# Patient Record
Sex: Female | Born: 1970 | Race: Black or African American | Hispanic: No | Marital: Single | Smoking: Never smoker
Health system: Southern US, Community
[De-identification: ages and names within clinical notes are randomized; demographics above are authoritative.]

## PROBLEM LIST (undated history)

## (undated) DIAGNOSIS — M419 Scoliosis, unspecified: Secondary | ICD-10-CM

## (undated) DIAGNOSIS — Q8500 Neurofibromatosis, unspecified: Secondary | ICD-10-CM

## (undated) DIAGNOSIS — R519 Headache, unspecified: Secondary | ICD-10-CM

## (undated) DIAGNOSIS — J45909 Unspecified asthma, uncomplicated: Secondary | ICD-10-CM

## (undated) DIAGNOSIS — N289 Disorder of kidney and ureter, unspecified: Secondary | ICD-10-CM

## (undated) DIAGNOSIS — I10 Essential (primary) hypertension: Secondary | ICD-10-CM

## (undated) DIAGNOSIS — E079 Disorder of thyroid, unspecified: Secondary | ICD-10-CM

## (undated) HISTORY — DX: Scoliosis, unspecified: M41.9

## (undated) HISTORY — DX: Neurofibromatosis, unspecified: Q85.00

## (undated) HISTORY — PX: OTHER SURGICAL HISTORY: SHX169

## (undated) HISTORY — DX: Disorder of thyroid, unspecified: E07.9

## (undated) HISTORY — PX: THYROIDECTOMY: SHX17

## (undated) HISTORY — DX: Essential (primary) hypertension: I10

---

## 2004-01-13 ENCOUNTER — Ambulatory Visit: Payer: Self-pay | Admitting: Family Medicine

## 2004-02-16 ENCOUNTER — Ambulatory Visit: Payer: Self-pay | Admitting: Pain Medicine

## 2004-02-25 ENCOUNTER — Ambulatory Visit: Payer: Self-pay | Admitting: Pain Medicine

## 2004-03-02 ENCOUNTER — Ambulatory Visit: Payer: Self-pay | Admitting: Pain Medicine

## 2004-03-08 ENCOUNTER — Ambulatory Visit: Payer: Self-pay | Admitting: Pain Medicine

## 2004-03-24 ENCOUNTER — Ambulatory Visit: Payer: Self-pay | Admitting: Pain Medicine

## 2004-04-02 ENCOUNTER — Ambulatory Visit: Payer: Self-pay

## 2004-04-15 ENCOUNTER — Ambulatory Visit: Payer: Self-pay | Admitting: Unknown Physician Specialty

## 2004-05-20 ENCOUNTER — Ambulatory Visit: Payer: Self-pay | Admitting: General Surgery

## 2004-12-24 ENCOUNTER — Ambulatory Visit: Payer: Self-pay | Admitting: Oncology

## 2005-03-23 ENCOUNTER — Ambulatory Visit: Payer: Self-pay | Admitting: Oncology

## 2005-09-16 ENCOUNTER — Ambulatory Visit: Payer: Self-pay | Admitting: Unknown Physician Specialty

## 2005-09-19 ENCOUNTER — Emergency Department: Payer: Self-pay | Admitting: Emergency Medicine

## 2005-09-19 ENCOUNTER — Other Ambulatory Visit: Payer: Self-pay

## 2005-09-21 ENCOUNTER — Inpatient Hospital Stay: Payer: Self-pay | Admitting: Internal Medicine

## 2005-09-22 ENCOUNTER — Other Ambulatory Visit: Payer: Self-pay

## 2005-10-19 ENCOUNTER — Ambulatory Visit: Payer: Self-pay

## 2006-05-12 IMAGING — CT CT THORACIC SPINE WITHOUT CONTRAST
2 of 15 series · 9 of 35 positions shown, 10 images · non-contrast
Comparison: Correlation is made with recent thoracic spine MRI
March 02, 2004.

REASON FOR EXAM: Abnormal MRI, kyphoscoliosis
COMMENTS:

PROCEDURE:     CT  - CT THORACIC SPINE WO  - April 02, 2004  [DATE]
RESULT:     The following report is from Dr. Ling Polo from [REDACTED].
HISTORY: 33-year-old female, scoliosis, upper back pain, becoming worse.
No recent injury or surgery.  Kyphoscoliosis.
Technical Factors:  2.5 mm axial thin sections were obtained through the
thoracic and lower cervical spine.  Sagittal and coronal reformats were
obtained.  Study performed without IV contrast.

[Series 6: inspace t-spine · axial · 0.31mm/px · z∈[-512,-386]mm · 3 of 510 slices shown, 4 images]
[im 128/510  soft-tissue]
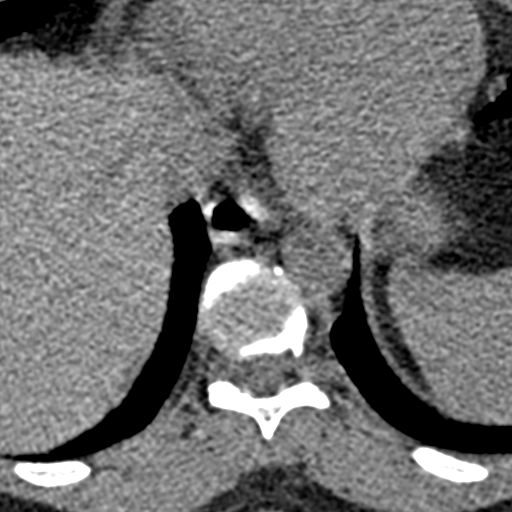
[im 128/510  bone]
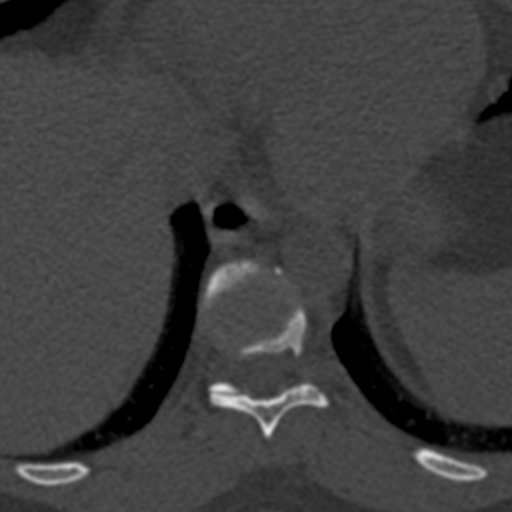
[im 255/510  bone]
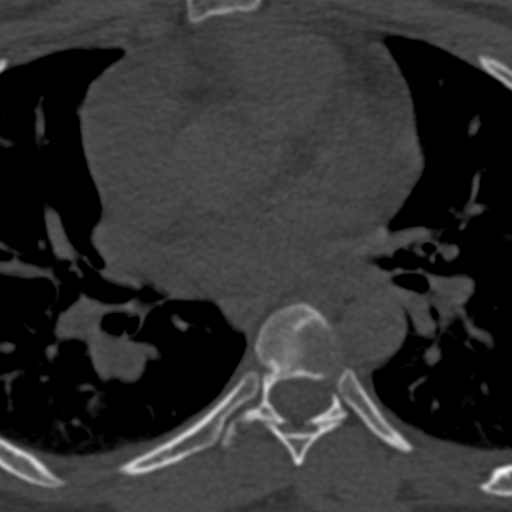
[im 382/510  bone]
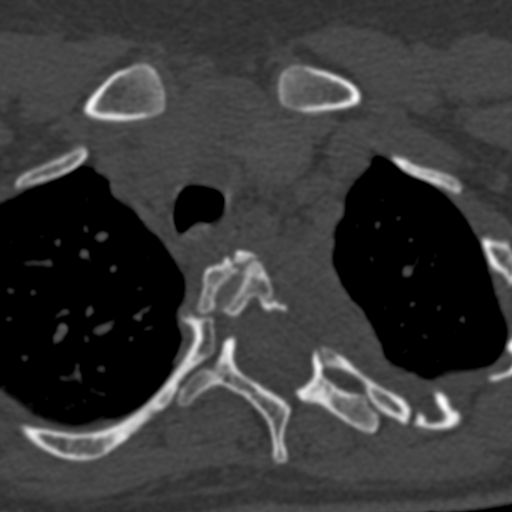

[Series 608: cor_lower_t · coronal · 0.60mm/px · 6 of 31 slices shown]
[im 9/31  soft-tissue]
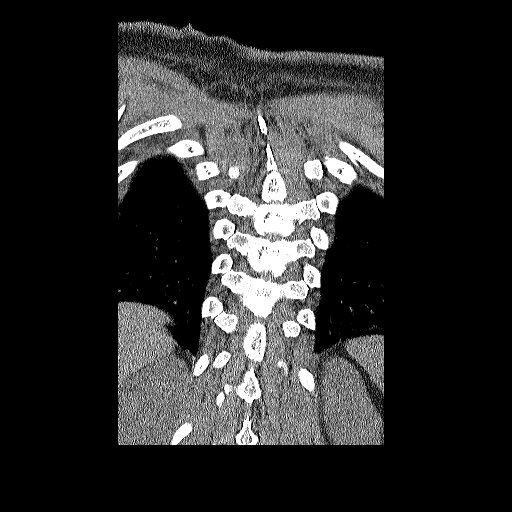
[im 11/31  bone]
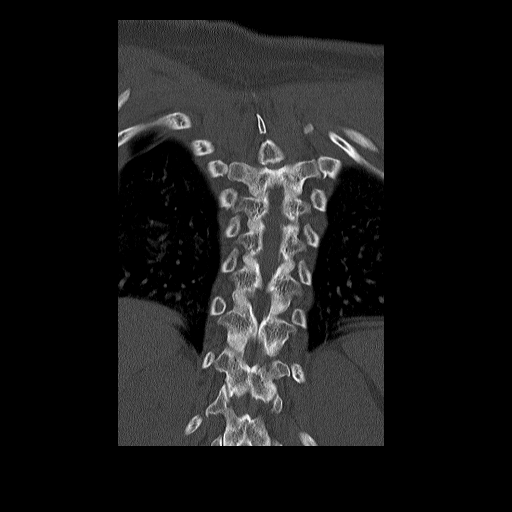
[im 13/31  bone]
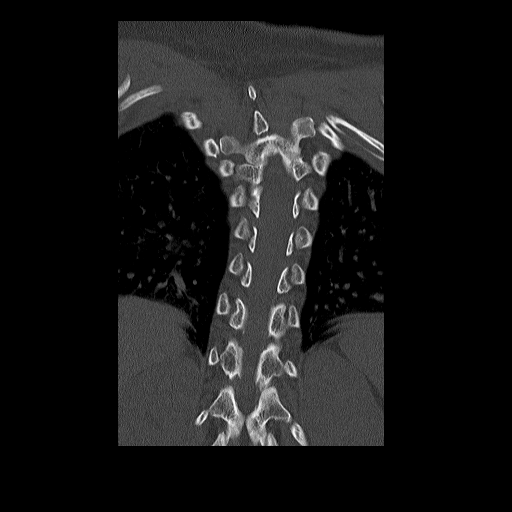
[im 16/31  bone]
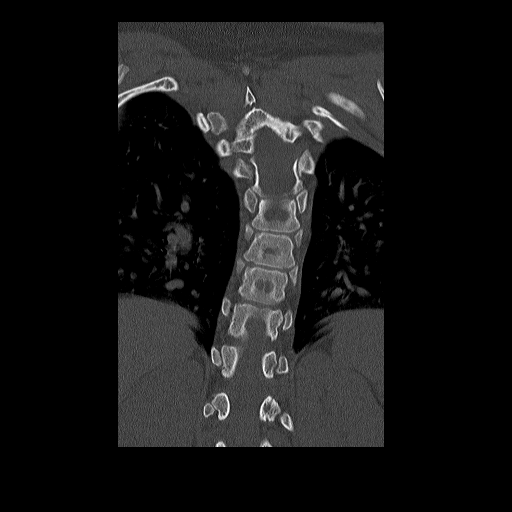
[im 18/31  bone]
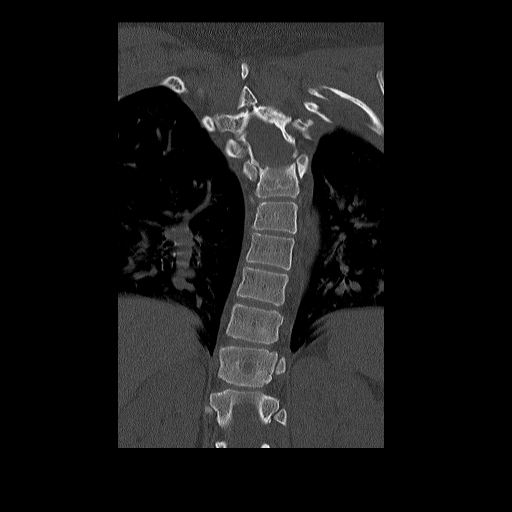
[im 21/31  bone]
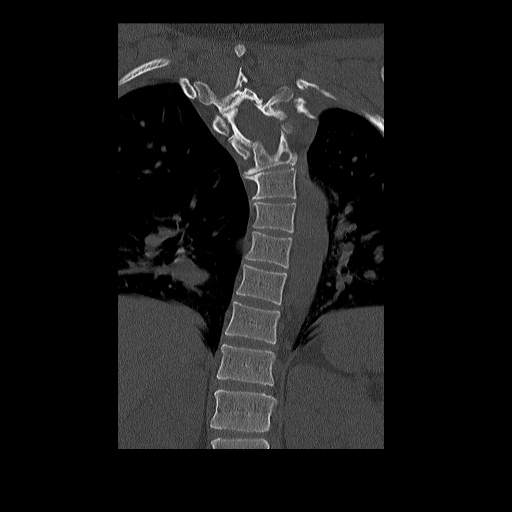

[9 of 35 positions shown; findings below may reference images not displayed]

FINDINGS: There is an S-shaped kyphoscoliosis in the upper thoracic spine,
having a leftward convex curvature with apex at approximately T5-6 level and
rightward convex curvature with apex at approximately the T1 level.  There
is associated mild to moderate multilevel degenerative disc change in the
upper thoracic and lower cervical spine, extending from approximately C6
through T6 or T7, with prominent rightward bony endplate spurring at T5-6
and leftward bony endplate spurring at T3-4, secondary to the scoliotic
curvatures.

Thoracic vertebrae demonstrate satisfactory height on the sagittal
reformats.  However, there is deformity of the T1 through T4 vertebral
bodies, appearing hypoplastic anteriorly and with posterior scalloping,
which is associated with ventral thecal sac contour alteration suggestive of
dural ectasia as a congenital/developmental anomaly.  Asymmetric hypoplasia
and chronic bone remodeling of the leftward pedicles in the upper thoracic
and lower cervical spine is also again noted, involving C7 through T4
levels.  The leftward T1 pedicle appears discontinuous, likely secondary to
chronic bony remodeling versus congenital/developmental absence or
hypoplasia.

Enlargement of the leftward C7-T1 and T3-4 neural foramina, associated with
this chronic pedicle remodeling, correlates with cystic dilatation of the
exiting nerve root sleeves at these levels, suggesting an additional
manifestation of dural ectasia.  The findings may reflect lateral thoracic
meningocele formation, rather than simple nerve root sheath cyst formation.

The chronic contour abnormalities and bony remodeling occurring in the upper
thoracic spine, associated with the kyphoscoliosis, results in multilevel
superimposed degenerative spondylotic change and chronic reactive bony
sclerosis along the posterior elements.

No evidence of acute/recent thoracic vertebral body or posterior element
fracture.

Evidence of a paraspinous soft tissue mass is again noted leftward, along
the medial aspect of the pulmonary apex, likely extrapleural in location.
This lies adjacent to the LEFT 1st through 4th ribs, which demonstrate
chronic bony remodeling.  As suggested on the recent thoracic spine MRI
report, in light of the thoracic spine findings, this paraspinous mass may
represent a plexiform neurofibroma, with malignant neoplastic etiology less
likely.  Additional MR imaging of the upper chest may be helpful in further
characterization of this mass, to include axial T2 and STIR, and coronal T1
and T2 sequences, and including the lower neck.  In addition, postcontrast
triplanar T1 sequences would be suggested if MR imaging is performed.

No substantive central spinal canal stenosis in the thoracic region.  Please
see separate thoracic spine MRI report for further discussion of discogenic
changes in the mid and lower thoracic spine.

The thyroid gland is included in the field of view on the axial sequences,
but is noted optimally evaluated due to absence of IV contrast and bone
algorithm technique used for the CT study.

Due to the extreme complexity of the bony changes in the upper thoracic
spine, either a complete set of films or a CD should be provided to the
treating physician when addressing spinal disease or complaints.
IMPRESSION: Moderate severity kyphosis in the upper thoracic spine as detailed above,
with associated multilevel mild to moderate degenerative spondylotic
changes.  See above for details.

Developmental hypoplasia of the T1 through T4 vertebral bodies, with
associated posterior vertebral body scalloping and multilevel leftward
pedicle hypoplasia and chronic remodeling.  Evidence of associated nerve
root sheath dilatation or lateral thoracic meningocele formation on MRI,
suggesting the presence of dural ectasia.  Please see separate MRI thoracic
spine report for further discussion.

Leftward paraspinal soft tissue mass, T1-3 levels along medial aspect of the
pulmonary apex, with evidence of chronic remodeling of adjacent left 1st
through 4th ribs.  Differential diagnosis includes plexiform neurofibroma
formation in light of the spinal canal findings described.  Malignant
neoplastic lesion is less likely, with no bone destruction evident.

Additional pre- and postcontrast MRI imaging of the upper thoracic spine and
cervicothoracic junction may be helpful and further delineation of the
paraspinous mass, as delineated in the prior thoracic spine MRI report.

Thank you for the opportunity to provide your interpretation.  If you have
any questions about this report, please call [REDACTED] ([DATE]-MRI-READ).

[REDACTED]

## 2006-07-06 ENCOUNTER — Ambulatory Visit: Payer: Self-pay | Admitting: Cardiology

## 2007-08-04 ENCOUNTER — Other Ambulatory Visit: Payer: Self-pay

## 2007-08-04 ENCOUNTER — Emergency Department: Payer: Self-pay | Admitting: Emergency Medicine

## 2007-09-05 ENCOUNTER — Emergency Department: Payer: Self-pay | Admitting: Emergency Medicine

## 2007-12-27 ENCOUNTER — Ambulatory Visit: Payer: Self-pay | Admitting: Internal Medicine

## 2008-02-27 ENCOUNTER — Ambulatory Visit: Payer: Self-pay | Admitting: Pain Medicine

## 2008-03-17 ENCOUNTER — Ambulatory Visit: Payer: Self-pay | Admitting: Pain Medicine

## 2008-05-06 ENCOUNTER — Ambulatory Visit: Payer: Self-pay | Admitting: Physician Assistant

## 2008-06-03 ENCOUNTER — Ambulatory Visit: Payer: Self-pay | Admitting: Physician Assistant

## 2008-06-23 ENCOUNTER — Ambulatory Visit: Payer: Self-pay | Admitting: Internal Medicine

## 2008-06-24 ENCOUNTER — Ambulatory Visit: Payer: Self-pay | Admitting: Internal Medicine

## 2008-06-26 ENCOUNTER — Emergency Department: Payer: Self-pay | Admitting: Unknown Physician Specialty

## 2008-07-18 ENCOUNTER — Ambulatory Visit: Payer: Self-pay | Admitting: Internal Medicine

## 2008-09-18 ENCOUNTER — Ambulatory Visit: Payer: Self-pay | Admitting: Physician Assistant

## 2009-05-16 ENCOUNTER — Emergency Department: Payer: Self-pay | Admitting: Emergency Medicine

## 2011-01-24 ENCOUNTER — Emergency Department: Payer: Self-pay | Admitting: Emergency Medicine

## 2012-01-26 ENCOUNTER — Emergency Department: Payer: Self-pay | Admitting: Emergency Medicine

## 2012-01-29 ENCOUNTER — Inpatient Hospital Stay: Payer: Self-pay | Admitting: Internal Medicine

## 2012-01-29 LAB — COMPREHENSIVE METABOLIC PANEL
Alkaline Phosphatase: 223 U/L — ABNORMAL HIGH (ref 50–136)
Anion Gap: 12 (ref 7–16)
BUN: 14 mg/dL (ref 7–18)
Bilirubin,Total: 0.6 mg/dL (ref 0.2–1.0)
Calcium, Total: 9.3 mg/dL (ref 8.5–10.1)
Chloride: 98 mmol/L (ref 98–107)
Co2: 28 mmol/L (ref 21–32)
EGFR (Non-African Amer.): >60
Glucose: 83 mg/dL (ref 65–99)
Osmolality: 275 (ref 275–301)
Potassium: 3.1 mmol/L — ABNORMAL LOW (ref 3.5–5.1)
SGOT(AST): 29 U/L (ref 15–37)

## 2012-01-29 LAB — CBC
HGB: 13.2 g/dL (ref 12.0–16.0)
MCV: 85 fL (ref 80–100)
Platelet: 252 10*3/uL (ref 150–440)
RBC: 4.58 10*6/uL (ref 3.80–5.20)
WBC: 9.9 10*3/uL (ref 3.6–11.0)

## 2012-01-29 LAB — TROPONIN I: Troponin-I: <0.02 ng/mL

## 2012-01-30 LAB — URINALYSIS, COMPLETE
Bilirubin,UR: NEGATIVE
Glucose,UR: NEGATIVE mg/dL (ref 0–75)
Ph: 6 (ref 4.5–8.0)
Protein: NEGATIVE
RBC,UR: 3 /HPF (ref 0–5)
Specific Gravity: 1.01 (ref 1.003–1.030)
Squamous Epithelial: 5
WBC UR: 3 /HPF (ref 0–5)

## 2012-07-19 DIAGNOSIS — I10 Essential (primary) hypertension: Secondary | ICD-10-CM | POA: Insufficient documentation

## 2012-07-19 DIAGNOSIS — Q8501 Neurofibromatosis, type 1: Secondary | ICD-10-CM | POA: Insufficient documentation

## 2012-07-19 DIAGNOSIS — Q8500 Neurofibromatosis, unspecified: Secondary | ICD-10-CM | POA: Insufficient documentation

## 2012-07-19 DIAGNOSIS — E785 Hyperlipidemia, unspecified: Secondary | ICD-10-CM | POA: Insufficient documentation

## 2012-07-25 ENCOUNTER — Ambulatory Visit: Payer: Self-pay | Admitting: Pain Medicine

## 2012-07-28 ENCOUNTER — Emergency Department: Payer: Self-pay | Admitting: Internal Medicine

## 2012-08-21 ENCOUNTER — Ambulatory Visit: Payer: Self-pay | Admitting: Internal Medicine

## 2012-08-21 ENCOUNTER — Ambulatory Visit: Payer: Self-pay | Admitting: Pain Medicine

## 2012-08-25 ENCOUNTER — Emergency Department: Payer: Self-pay | Admitting: Emergency Medicine

## 2012-08-25 LAB — COMPREHENSIVE METABOLIC PANEL
Albumin: 3.8 g/dL (ref 3.4–5.0)
Anion Gap: 7 (ref 7–16)
Calcium, Total: 9.1 mg/dL (ref 8.5–10.1)
Chloride: 108 mmol/L — ABNORMAL HIGH (ref 98–107)
EGFR (African American): >60
Glucose: 105 mg/dL — ABNORMAL HIGH (ref 65–99)
Osmolality: 278 (ref 275–301)
Total Protein: 8.9 g/dL — ABNORMAL HIGH (ref 6.4–8.2)

## 2012-08-25 LAB — CBC
MCH: 27.6 pg (ref 26.0–34.0)
MCHC: 32.8 g/dL (ref 32.0–36.0)
MCV: 84 fL (ref 80–100)
Platelet: 352 10*3/uL (ref 150–440)
RBC: 4.37 10*6/uL (ref 3.80–5.20)
WBC: 8.7 10*3/uL (ref 3.6–11.0)

## 2013-07-26 DIAGNOSIS — M25551 Pain in right hip: Secondary | ICD-10-CM | POA: Insufficient documentation

## 2013-07-26 DIAGNOSIS — M549 Dorsalgia, unspecified: Secondary | ICD-10-CM | POA: Insufficient documentation

## 2013-07-26 DIAGNOSIS — Q8501 Neurofibromatosis, type 1: Secondary | ICD-10-CM | POA: Insufficient documentation

## 2013-07-26 DIAGNOSIS — M546 Pain in thoracic spine: Secondary | ICD-10-CM | POA: Insufficient documentation

## 2013-07-26 DIAGNOSIS — F1190 Opioid use, unspecified, uncomplicated: Secondary | ICD-10-CM | POA: Insufficient documentation

## 2013-07-26 DIAGNOSIS — M792 Neuralgia and neuritis, unspecified: Secondary | ICD-10-CM | POA: Insufficient documentation

## 2013-07-26 DIAGNOSIS — G8929 Other chronic pain: Secondary | ICD-10-CM | POA: Insufficient documentation

## 2013-08-28 DIAGNOSIS — F4320 Adjustment disorder, unspecified: Secondary | ICD-10-CM | POA: Insufficient documentation

## 2013-08-28 DIAGNOSIS — F410 Panic disorder [episodic paroxysmal anxiety] without agoraphobia: Secondary | ICD-10-CM | POA: Insufficient documentation

## 2013-12-02 ENCOUNTER — Ambulatory Visit: Payer: Self-pay | Admitting: Internal Medicine

## 2013-12-21 ENCOUNTER — Emergency Department: Payer: Self-pay | Admitting: Emergency Medicine

## 2013-12-25 DIAGNOSIS — G8929 Other chronic pain: Secondary | ICD-10-CM | POA: Insufficient documentation

## 2014-03-13 ENCOUNTER — Ambulatory Visit: Payer: Self-pay | Admitting: Internal Medicine

## 2014-04-30 DIAGNOSIS — Z5181 Encounter for therapeutic drug level monitoring: Secondary | ICD-10-CM | POA: Insufficient documentation

## 2014-07-09 DIAGNOSIS — Z8669 Personal history of other diseases of the nervous system and sense organs: Secondary | ICD-10-CM | POA: Insufficient documentation

## 2014-07-09 HISTORY — DX: Personal history of other diseases of the nervous system and sense organs: Z86.69

## 2014-07-29 NOTE — H&P (Signed)
PATIENT NAME:  [name] [name], [name] [name] MR#:  [id number] DATE OF BIRTH:  02/26/1971  DATE OF ADMISSION:  01/29/2012  PRIMARY CARE PHYSICIAN: Dr. Clayborn Bigness.   REFERRING PHYSICIAN: Dr. Lavonia Drafts.   CHIEF COMPLAINT: Right facial swelling and tenderness, fever and chills.   HISTORY OF PRESENT ILLNESS: Ms. Richer is a 44 year old African American female. She stated that on 10/16, four days ago, she developed dull aching pain on the right facial area and right forehead area down to the lower jaw. She came to the Emergency Department at that time, but she could not wait long and she decided to go home. In the last few days she developed fever and chills and the pain and tenderness increased. Now the pain becomes severe, reaching 10 on a scale of 10. The quality of the pain changed from dull aching pain to sharp pain. She has difficulty opening the mouth only minimally to drink some liquids. Also, it is painful to chew or swallow due to pain in the jaw area and the right side of the face. Evaluation here with CT scan of the head is consistent with inflammation or cellulitis of right facial area. No abscess was identified although the presence of metallic hardening artifact from dental fillings does limit evaluation of the soft tissues. The patient was admitted for further evaluation and treatment, especially that she had fever and tachycardia with heart rates reaching 140 per minute. Blood cultures were taken and antibiotic was initiated at the Emergency Department using clindamycin.   REVIEW OF SYSTEMS: CONSTITUTIONAL: Reports fever at home for which she was taking Tylenol. She has chills. No fatigue. EYES: No blurring of vision. No double vision. ENT: No hearing impairment. No sore throat other than pain on the right side of the mouth and jaw and face when she is swallowing. CARDIOVASCULAR: No chest pain. No shortness of breath. No syncope. RESPIRATORY: No cough. No sputum production. No shortness of breath. No  chest pain. GASTROINTESTINAL: No abdominal pain. No vomiting. No diarrhea. GENITOURINARY: No dysuria. No frequency of urination. MUSCULOSKELETAL: No joint swelling or pain. No muscular pain or swelling other than the right facial soft tissue swelling. INTEGUMENTARY: No skin rash. No ulcers. NEUROLOGY: No focal weakness. No seizure  activity. No headache other than the pain on the right side of the face and forehead area. PSYCHIATRY: She has a history of anxiety and depression. ENDOCRINE: No polyuria or polydipsia. No heat or cold intolerance.   PAST MEDICAL HISTORY:  1. History of neurofibromatosis.  2. History of goiter status post partial thyroidectomy. 3. History of anxiety and depression.  4. 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 This has been ongoing for many years. She states it has not changed in size.   PAST SURGICAL HISTORY:  1. Partial thyroidectomy.  2. Cholecystectomy. 3. Cesarean section.   SOCIAL HABITS: Nonsmoker. No history of alcohol or drug abuse.   SOCIAL HISTORY: She is single, has two children. She is unemployed and she is filing for disability based on her back pain and scoliosis.   FAMILY HISTORY: Her mother died from brain cancer. On the father's side there is family history of thyroid problem, not well identified.   ADMISSION MEDICATIONS:  1. Vicodin 5/500 q.6 hours p.r.n.  2. Tylenol Arthritis p.r.n.  3. Tizanidine 4 milligrams. 4. Proventil inhaler p.r.n.  5. Nexium 40 mg a day. 6. Ibuprofen 800 mg once a day. 7. Xanax 0.5 mg once a day p.r.n.  8. Cymbalta  60 mg once a day.   ALLERGIES: Penicillin causing skin rash.   PHYSICAL EXAMINATION:  VITAL SIGNS: Blood pressure 121/93, respiratory rate 16, pulse 144 per minute, temperature 99.7, oxygen saturation 96%.   GENERAL APPEARANCE: Young female lying in bed in no acute distress, but she appears in moderate pain.   HEAD/NECK: No pallor. No icterus. No cyanosis.   ENT:  Hearing was normal. Nasal mucosa, lips were normal. However, she cannot open her mouth only partially. I could not visualize the throat area adequately. The tongue appeared to be normal. There is a foul odor coming out of her mouth.   EYES: Normal iris and conjunctivae. Pupils about 2 to 3 mm, constricted. I could not see reactivity to light.   NECK: Supple. Trachea at midline. There is a scar tissue at the lower anterior neck consistent with the previous thyroidectomy surgery. There is tenderness on the right side of the neck, right side of the face and jaw area with soft tissue swelling. There are a few skin lesions papule-like consistent with her history of neurofibromatosis. These are located on the neck area.   CARDIOVASCULAR: Normal S1, S2. No S3, S4. No murmur. No gallop. No carotid bruits.   RESPIRATORY: Normal breathing pattern without use of accessory muscles. No rales. No wheezing.   ABDOMEN: Soft without tenderness. No hepatosplenomegaly. No masses. No hernias.   SKIN: No ulcers. There are scattered skin lesions in the form of papules consistent with her neurofibromatosis.   MUSCULOSKELETAL: No joint swelling. No clubbing.   NEUROLOGIC: Cranial nerves II through XII are intact. No focal motor deficits.   PSYCHIATRIC: The patient is alert and oriented x3. Mood and affect were flat.   LABORATORY, DIAGNOSTIC, AND RADIOLOGICAL DATA: Her EKG showed sinus tachycardia at rate of 125 per minute. CT scan of the maxillofacial area showed mildly increased soft tissue density adjacent to the body of the mandible on the right consistent with inflammation. There is no evidence of soft tissue abscess. There are enlarged lymph nodes in the submandibular region and the anterior cervical region bilaterally. No thyroid tissue on the right is present, but she had prominent left thyroid lobe. Her CBC showed white count of 9,000, hemoglobin 13, hematocrit 38, platelet count 252. Troponin less than 0.02.  Serum glucose 83, BUN 14, creatinine 0.4, sodium 138, potassium 3.1, chloride 98, bicarbonate 28, calcium 9.3, albumin 3.3, alkaline phosphatase 223, AST 29, ALT 50. Troponin less than 0.02.   ASSESSMENT:  1. Right facial cellulitis.  2. Mild hypokalemia.  3. Dysphagia or odynophagia secondary to the pain in the mouth and right facial area also limiting her mouth opening and chewing.  4. Neurofibromatosis.  5. History of goiter status post partial thyroidectomy removing the right side of the thyroid. 6. Gastroesophageal reflux disease.  7. Depression and anxiety. 8. Chronic back pain. 9. Herniated disk disease. 10. Scoliosis.   PLAN:  1. We will admit the patient to the medical floor.  2. Blood cultures x2 were taken.  3. IV antibiotic using clindamycin was initiated.  4. I will consult ENT to see the patient and evaluate the oropharyngeal area and neck and the extent of her cellulitis.  5. I noted a slight elevation of alkaline phosphatase. This could be liver or bone in origin. At this point, I do not know the significance of this elevation or her baseline. However, in 2011 her alkaline phosphatase was normal. It was 101.  6. IV hydration and potassium supplementation to correct the  hypokalemia.  7. I will place the patient on clear liquids.  8. Pain control.   TIME SPENT EVALUATING THIS PATIENT: More than 55 minutes.   ____________________________ Clovis Pu. Lenore Manner, MD amd:ap D: 01/29/2012 23:39:25 ET T: 01/30/2012 08:43:15 ET JOB#: [id number]  cc: Clovis Pu. Lenore Manner, MD, <Dictator> Lavera Guise, MD Mike Craze Irven Coe MD ELECTRONICALLY SIGNED 01/31/2012 1:29

## 2014-07-29 NOTE — Consult Note (Signed)
Brief Consult Note: Diagnosis: dental infection.   Patient was seen by consultant.   Consult note dictated.   Recommend further assessment or treatment.   Comments: 44 y.o. female with 6 day history of dental pain with progressive swelling of right mandible.  Admitted for cellulitis.  No significant change yet on IV antibiotics.  PE- Right mandibular swelling and pain with palpation of right mandibular molar.  Mild amount of purulence drainage with palpation of right mandibular molar.  No fluid collection.  CT reviewed- no abcess  Impression:  Dental infection.  Agree with IV clindamycin and switch to PO once symp improvement.  Rec. patient get tooth extraction.  No surgical intervention needed at this time.  Electronic Signatures: Delphine Sizemore, Shela Leff (MD)  (Signed 21-Oct-13 13:29)  Authored: Brief Consult Note   Last Updated: 21-Oct-13 13:29 by Pascal Lux (MD)

## 2014-07-29 NOTE — Discharge Summary (Signed)
PATIENT NAME:  [name] [name], [name] [name] MR#:  [id number] DATE OF BIRTH:  04-20-1970  DATE OF ADMISSION:  01/29/2012 DATE OF DISCHARGE:  01/31/2012  DISCHARGE DIAGNOSES:  1. Facial cellulitis on the right side, possible periodontal abscess versus infection in the molar area on the right side.  2. Hypertension. 3. Depression.  4. Neurofibromatosis.  DISCHARGE MEDICATIONS:  1. Ibuprofen 800 mg daily as needed. 2. Proventil 0.5 two puffs p.r.n.   3. Metoprolol 100 mg p.o. daily. 4. Tramadol 50 mg p.o. t.i.d.  5. Celexa 20 mg 3 tablets daily.  6. Neurontin 600 mg p.o. t.i.d.  7. Acetaminophen with codeine 5/325 every six hours as needed p.r.n., 15 tablets. 8. Clindamycin 600 every eight hours t.i.d. This is for 10 days.   CONSULTATION: ENT consult with Dr. Carloyn Manner   FOLLOW-UP: The patient was advised to follow-up with dentist as soon as possible.   HOSPITAL COURSE: The patient is a 44 year old female with history of neurofibromatosis, hypertension, and depression who came in because of pain, unable to swallow, having right side facial swelling. The patient had this pain for six days with tooth pain and developed acute pain and because of difficulty swallowing was admitted and started on IV clindamycin, IV fluids, and Percocet was started along with morphine. The patient was seen by Dr. Pryor Ochoa and he reviewed the CAT scan of the face and showed signs of cellulitis with no fluid collection. The patient was advised to continue IV medicine if she can tolerate p.o. Dr. Jeannie Fend Vaught advised to discharge her on clindamycin. The patient needs to follow-up with a dentist regarding possible extraction from the righ lower molars. Today she was able to tolerate liquids and <taking meds  by mouth and was discharged home with Percocet and also clindamycin.  The patient's lab data shows urinalysis hazy colored urine. CAT scan of the maxillofacial area no evidence of sinusitis, no acute bony abnormality,  mild increased soft tissue density adjacent to the body of the mandible on with right consistent with inflammation. No evidence of soft tissue abscess. White count 9.9, hemoglobin 13.2, hematocrit 38, and platelets 252. Potassium was 3.1 on October 20th.   DISCHARGE VITALS: Temperature 99, pulse 95, respirations 20, blood pressure 122/84, sats 97% on room air.   TIME SPENT ON DISCHARGE: More than 30 minutes.   ____________________________ Epifanio Lesches, MD sk:drc D: 01/31/2012 21:24:28 ET T: 02/01/2012 09:31:40 ET JOB#: [id number]  cc: Epifanio Lesches, MD, <Dictator> Epifanio Lesches MD ELECTRONICALLY SIGNED 02/21/2012 15:53

## 2014-07-29 NOTE — Consult Note (Signed)
PATIENT NAME:  [name] [name], [name] [name] MR#:  [id number] DATE OF BIRTH:  1970-11-26  DATE OF CONSULTATION:  01/30/2012  REFERRING PHYSICIAN:  Dr. Lenore Manner CONSULTING PHYSICIAN:  Jerene Bears, MD  REASON FOR CONSULTATION: Right-sided facial swelling.   HISTORY OF PRESENT ILLNESS: The patient is a 44 year old female who presents today to the Emergency Room for evaluation of right-sided facial swelling and tenderness. She reports that it began about six days ago with tooth pain and then developed aching pain, right facial area, forehead and lower jaw and noticed some swelling. The pain became severe. She had some problems with her swallowing. She was seen in the Emergency Room. Blood cultures were taken and IV clindamycin was instituted. I was consulted by internal medicine for evaluation. CT scan revealed no significant abscess. The patient denies any breathing problems but has had some problems with odynophagia.   PAST MEDICAL HISTORY:  1. Neurofibromatosis.  2. History of partial thyroidectomy. 3. Depression. 4. Anxiety.   PAST SURGICAL HISTORY:  1. Hemithyroidectomy. 2. Cholecystectomy.  3. C-section.   SOCIAL HISTORY: The patient denies any significant tobacco or alcohol use. The patient is single with two children and unemployed.   FAMILY HISTORY: Brain cancer, thyroid issues.   CURRENT MEDICATIONS: Reviewed and documented in chart.   ALLERGIES: Penicillin.   PHYSICAL EXAMINATION:    VITAL SIGNS: Temperature 98.3, pulse 106, respirations 20, blood pressure 129/91, pulse oximetry 97%.   GENERAL: She is a well-nourished, well-developed female in no acute distress. There is no stridor or stertor.   EARS: EACs clear.   NOSE: Clear anteriorly.   ORAL CAVITY AND OROPHARYNX: Purulent drainage emanating from around the molar on the right mandible. There is no significant swelling in the floor of mouth. There is some tenderness with palpation of her mandible along that area. There is no  obvious fluctuance.   NECK: Right posterior angled mandible swelling and some lymphadenopathy, but again no discrete fluid collection is identified.   DIAGNOSTIC DATA: CT scan is reviewed which reveals some lymphadenopathy and some fat stranding and signs of phlegmon cellulitis, but no obvious fluid collection.   IMPRESSION: Dental abscess.   PLAN: I agree with clindamycin to cool this down. The patient will need to have a dental extraction. Would switch to p.o. clindamycin once the patient is unable to tolerate p.o. pain medicine. Please reconsult with any questions or concerns.    ____________________________ Jerene Bears, MD ccv:ap D: 01/30/2012 18:05:25 ET T: 01/31/2012 09:28:52 ET JOB#: [id number]  cc: Jerene Bears, MD, <Dictator> Jerene Bears MD ELECTRONICALLY SIGNED 02/13/2012 10:37

## 2014-11-06 DIAGNOSIS — E782 Mixed hyperlipidemia: Secondary | ICD-10-CM | POA: Diagnosis not present

## 2014-11-06 DIAGNOSIS — F411 Generalized anxiety disorder: Secondary | ICD-10-CM | POA: Diagnosis not present

## 2014-11-06 DIAGNOSIS — Q8509 Other neurofibromatosis: Secondary | ICD-10-CM | POA: Diagnosis not present

## 2014-11-06 DIAGNOSIS — I10 Essential (primary) hypertension: Secondary | ICD-10-CM | POA: Diagnosis not present

## 2014-11-07 ENCOUNTER — Other Ambulatory Visit: Payer: Self-pay | Admitting: Internal Medicine

## 2014-11-07 DIAGNOSIS — Z1231 Encounter for screening mammogram for malignant neoplasm of breast: Secondary | ICD-10-CM

## 2014-12-04 ENCOUNTER — Ambulatory Visit
Admission: RE | Admit: 2014-12-04 | Discharge: 2014-12-04 | Disposition: A | Discharge status: Home / Self Care | Payer: Medicare PPO | Source: Ambulatory Visit | Attending: Internal Medicine | Admitting: Internal Medicine

## 2014-12-04 ENCOUNTER — Other Ambulatory Visit: Payer: Self-pay | Admitting: Internal Medicine

## 2014-12-04 DIAGNOSIS — Z1231 Encounter for screening mammogram for malignant neoplasm of breast: Secondary | ICD-10-CM | POA: Insufficient documentation

## 2015-01-14 DIAGNOSIS — Q8501 Neurofibromatosis, type 1: Secondary | ICD-10-CM | POA: Diagnosis not present

## 2015-01-14 DIAGNOSIS — R0789 Other chest pain: Secondary | ICD-10-CM | POA: Diagnosis not present

## 2015-01-14 DIAGNOSIS — Z79891 Long term (current) use of opiate analgesic: Secondary | ICD-10-CM | POA: Diagnosis not present

## 2015-01-14 DIAGNOSIS — M546 Pain in thoracic spine: Secondary | ICD-10-CM | POA: Diagnosis not present

## 2015-01-14 DIAGNOSIS — Z5181 Encounter for therapeutic drug level monitoring: Secondary | ICD-10-CM | POA: Diagnosis not present

## 2015-01-14 DIAGNOSIS — G8929 Other chronic pain: Secondary | ICD-10-CM | POA: Diagnosis not present

## 2015-02-18 ENCOUNTER — Other Ambulatory Visit: Payer: Self-pay | Admitting: Anesthesiology

## 2015-02-18 ENCOUNTER — Encounter: Payer: Self-pay | Admitting: Anesthesiology

## 2015-02-18 ENCOUNTER — Ambulatory Visit: Payer: Medicare PPO | Attending: Anesthesiology | Admitting: Anesthesiology

## 2015-02-18 VITALS — BP 119/89 | HR 105 | Temp 98.7°F | Resp 16 | Ht 62.0 in | Wt 145.0 lb

## 2015-02-18 DIAGNOSIS — R079 Chest pain, unspecified: Secondary | ICD-10-CM | POA: Diagnosis not present

## 2015-02-18 DIAGNOSIS — E039 Hypothyroidism, unspecified: Secondary | ICD-10-CM | POA: Diagnosis not present

## 2015-02-18 DIAGNOSIS — Q8500 Neurofibromatosis, unspecified: Secondary | ICD-10-CM | POA: Diagnosis not present

## 2015-02-18 DIAGNOSIS — M5134 Other intervertebral disc degeneration, thoracic region: Secondary | ICD-10-CM | POA: Diagnosis not present

## 2015-02-18 DIAGNOSIS — J45909 Unspecified asthma, uncomplicated: Secondary | ICD-10-CM | POA: Insufficient documentation

## 2015-02-18 DIAGNOSIS — Z79899 Other long term (current) drug therapy: Secondary | ICD-10-CM | POA: Diagnosis not present

## 2015-02-18 DIAGNOSIS — M545 Low back pain: Secondary | ICD-10-CM | POA: Insufficient documentation

## 2015-02-18 DIAGNOSIS — Q8501 Neurofibromatosis, type 1: Secondary | ICD-10-CM | POA: Insufficient documentation

## 2015-02-18 DIAGNOSIS — M4120 Other idiopathic scoliosis, site unspecified: Secondary | ICD-10-CM | POA: Insufficient documentation

## 2015-02-18 DIAGNOSIS — G8929 Other chronic pain: Secondary | ICD-10-CM | POA: Diagnosis not present

## 2015-02-18 DIAGNOSIS — M4134 Thoracogenic scoliosis, thoracic region: Secondary | ICD-10-CM | POA: Diagnosis not present

## 2015-02-18 DIAGNOSIS — M546 Pain in thoracic spine: Secondary | ICD-10-CM | POA: Diagnosis not present

## 2015-02-18 DIAGNOSIS — G894 Chronic pain syndrome: Secondary | ICD-10-CM | POA: Diagnosis not present

## 2015-02-18 DIAGNOSIS — Z79891 Long term (current) use of opiate analgesic: Secondary | ICD-10-CM | POA: Diagnosis not present

## 2015-02-18 NOTE — Progress Notes (Signed)
Safety precautions to be maintained throughout the outpatient stay will include: orient to surroundings, keep bed in low position, maintain call bell within reach at all times, provide assistance with transfer out of bed and ambulation.  0000/00////0///////////////0

## 2015-02-23 ENCOUNTER — Encounter: Payer: Self-pay | Admitting: Anesthesiology

## 2015-02-23 NOTE — Progress Notes (Signed)
Subjective:    Patient ID: [name] [name], female    DOB: [birth year], 44 y.o.   MRN: [id number]  HPI This patient is a pleasant and deligh who comes in complaining of chronic thoracic back pain. 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  TENS units also help her control her pain Her pain is aggravated by standing  Pain medications She takes tramadol gabapentin and amitriptyline for pain  Other medications Other medications include simvastatin metoprolol ranitidine Ventolin and Tylenol  Allergies She is allergic to penicillin  Past medical history Past medical history is positive for neurofibromatosis hypothyroidism scoliosis tachycardia and asthma  Past surgical history Past surgical history is positive for 2 cesarean section,  a partial thyroidectomy cholecystectomy and multiple excisions of neurofibromatosis tumors  Social and economic history This patient does not smoke She does not use alcohol And she does not use illicit drugs  Family history She is para 2+0 She has 2 children ages 80 and 42 and they are both alive and well She lives with  Her 2 children Her mother is deceased at age 50  From the complications of brain cancer Her father is alive at age 27 and he is well She has no sisters She has 3 brothers: 2 are deceased.  One committed suicide at age 60 and the other  Brother died of AIDS at age 1 She has one brother who is alive at age 13 but he too has neurofibromatosis She has been disables and receives SSIneurofibromatosis   Review of Systems  Constitutional: Negative.  Negative for fever, chills,  diaphoresis, activity change, appetite change, fatigue and unexpected weight change.  HENT: Negative.  Negative for congestion, dental problem, drooling, ear discharge, ear pain, facial swelling, hearing loss, mouth sores, nosebleeds, postnasal drip, rhinorrhea and sinus pressure.   Eyes: Negative.  Negative for photophobia, pain, discharge, redness, itching and visual disturbance.  Respiratory: Negative.  Negative for apnea, cough, choking, chest tightness, shortness of breath, wheezing and stridor.   Cardiovascular: Negative.  Negative for chest pain and leg swelling.  Gastrointestinal: Negative.  Negative for nausea, vomiting, abdominal pain, diarrhea, constipation, blood in stool, abdominal distention, anal bleeding and rectal pain.  Endocrine: Negative for cold intolerance, heat intolerance, polydipsia, polyphagia and polyuria.       This patient has a history of hypothyroidism  Genitourinary: Negative.   Musculoskeletal:       She has a diagnosis of scoliosis and has  Had residual back pain for many years  Allergic/Immunologic: Negative.  Negative for environmental allergies, food allergies and immunocompromised state.  Neurological: Negative for dizziness, tremors, seizures, syncope, facial asymmetry, speech difficulty, weakness, light-headedness, numbness and headaches.       This patient has a history of Von Recklinghausen'disease or neurofibromatosis and has  Multiple nodules all over her body  Hematological: Negative.  Negative for adenopathy. Does not bruise/bleed easily.  Psychiatric/Behavioral: Negative.        Objective:   Physical Exam  Constitutional: She is oriented to person, place, and time. She appears well-developed and well-nourished. No distress.  HENT:  Head: Normocephalic and atraumatic.  Right Ear: External ear  normal.  Left Ear: External ear normal.  Nose: Nose normal.  Mouth/Throat: Oropharynx is clear and moist. No oropharyngeal exudate.  Eyes: Conjunctivae  and EOM are normal. Pupils are equal, round, and reactive to light. Right eye exhibits no discharge. Left eye exhibits no discharge. No scleral icterus.  Neck: Normal range of motion. Neck supple. No JVD present. No tracheal deviation present. No thyromegaly present.  Cardiovascular: Normal rate, regular rhythm, normal heart sounds and intact distal pulses.  Exam reveals no gallop and no friction rub.   No murmur heard. Her blood pressure was 119/89 mmHg Pulse was 105 beats per minutes Equal and regular Heart sounds 1 and 2 were heard in all areas Respirations were 15 breaths per minute minu Temperature was 98.80F SPO2 was 100%  Pulmonary/Chest: Effort normal and breath sounds normal. No stridor. No respiratory distress. She has no wheezes. She has no rales. She exhibits no tenderness.  Abdominal: Soft. Bowel sounds are normal. She exhibits no distension and no mass. There is no tenderness. There is no rebound and no guarding.  Genitourinary:  Genitourinary examination was deferred  Musculoskeletal: She exhibits tenderness. She exhibits no edema.  There were multiple neurofibroma nodulesall over her anterior chest and neck her face and her arms Those nodules were greatest in the anteri She also had thoracic or lumbar scolio There was marked  Decrease in range of motion lower extremities and in flexion of her  hips  Lymphadenopathy:    She has no cervical adenopathy.  Neurological: She is alert and oriented to person, place, and time. She has normal reflexes. She displays normal reflexes. No cranial nerve deficit. She exhibits normal muscle tone. Coordination normal.  Skin: Skin is warm and dry. No rash noted. She is not diaphoretic. No erythema. No pallor.  There were multiple neurofibroma nodules all over her anterior chest and neck face and arms  Psychiatric: She has a normal mood and affect. Her behavior is normal. Judgment and thought content normal.  Nursing note and vitals  reviewed.         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

## 2015-02-26 LAB — TOXASSURE SELECT 13 (MW), URINE: PDF: 0

## 2015-03-17 ENCOUNTER — Encounter: Payer: Self-pay | Admitting: Physical Therapy

## 2015-03-17 ENCOUNTER — Ambulatory Visit: Payer: Medicare PPO | Attending: Anesthesiology | Admitting: Physical Therapy

## 2015-03-17 DIAGNOSIS — M546 Pain in thoracic spine: Secondary | ICD-10-CM | POA: Diagnosis not present

## 2015-03-17 DIAGNOSIS — Q8500 Neurofibromatosis, unspecified: Secondary | ICD-10-CM | POA: Diagnosis not present

## 2015-03-17 DIAGNOSIS — M4120 Other idiopathic scoliosis, site unspecified: Secondary | ICD-10-CM | POA: Diagnosis not present

## 2015-03-17 NOTE — Therapy (Signed)
[place] MAIN [facility] SERVICES [address] [place], [place], [address] Phone: [phone]   Fax:  [phone]  Physical Therapy Evaluation  Patient Details  Name: [name] [name] MRN: [id number] Date of Birth: Dec 09, 1970 No Data Recorded  Encounter Date: 03/17/2015      PT End of Session - 03/17/15 1043    Visit Number 1   Number of Visits 1   Date for PT Re-Evaluation 03/24/15   PT Start Time 1010   PT Stop Time [id number]   PT Time Calculation (min) 34 min   Activity Tolerance Patient tolerated treatment well;No increased pain   Behavior During Therapy [facility] for tasks assessed/performed      Past Medical History  Diagnosis Date  . Neurofibromatosis ([place])     since birth; contributes to back pain  . Hypertension     controlled  . Thyroid disease     controlled  . Scoliosis of thoracic spine     "C" curve, convex to left    Past Surgical History  Procedure Laterality Date  . Cesarean section    . [place] procedure    . Thyroidectomy      There were no vitals filed for this visit.  Visit Diagnosis:  Neurofibromatosis ([place]) - Plan: PT plan of care cert/re-cert  Left-sided thoracic back pain - Plan: PT plan of care cert/re-cert  Idiopathic scoliosis - Plan: PT plan of care cert/re-cert      Subjective Assessment - 03/17/15 1016    Subjective Patient reports increased upper thoracic back pain which radiates to her chest (only on left side); She reports having pain for about 20 years. She reports that it has gotten worse in last 5 years. She reports numbness and tingling down LUE (inside of arm) and down RLE;    Pertinent History Neurofibromytosis   How long can you sit comfortably? 15 min   How long can you stand comfortably? 15 min   How long can you walk comfortably? 20-25 min   Diagnostic tests none recent   Patient Stated Goals "I want to get a TENs unit"   Currently in Pain? Yes   Pain Score 4    Pain Location Back    Pain Orientation Upper;Left   Pain Descriptors / Indicators Aching;Throbbing   Pain Type Chronic pain   Pain Radiating Towards left side   Pain Onset More than a month ago   Pain Frequency Constant   Aggravating Factors  sitting or standing for extended period of time   Pain Relieving Factors medication, lying down, changing positions   Effect of Pain on Daily Activities no changes reported                 Tens Evaluation [facility]) - 03/17/15 1019    TENS History   History of Current Condition upper back pain on left side which has been going on for 20+ years; Has a history of Neurofibromatosis   Treatments used to date Medication;Cryo/Thermotherapy;Physical Therapy  Heat (temporary relief); no help from PT in the past   ROM Impairments and Functional Limitations scoliotic curve of thoracic spine, convex left with scapular winging   ROM WFL    Strength Impairments and Functional Limitations BUE: grossly 4-/5, BLE hip 4/5, knee 4/5, ankle 4-/5   Sensation Impairments and Fuctional Limitations reports numbness and tingling in LUE C7 dermatome   Sensation intact by gross assessment for light touch/deep pressure for BUE and BLE   Contraindications/Precautions --  none   Assessment and Plan   Unit and Parameters Tens unit   Assessment/ Plan Patient is able to demonstrate proper setup and use of TENS unit, no further skilled needs.   Comments has caregiver at home that will help with set up on back; independent in understanding safe use and set up   Initial Parameters used modulation setting   Type/Brand of TENS issued: Logistem/Medical Modalities   Pre-programmed setting: modulation 1 or 2   Frequency and duraton of daily use: 15-30 min at one setting, up to 2 times a day maximum for less attenuation and better results of use         Patient educated in safe set up  And use of TENs unit along left thoracic paraspinals in modulation setting. Educated patient on how to use  timer and adjust intensity to tolerance. Provided written handout with recommended settings and electrode placement x 8 min;              PT Education - March 20, 2015 1043    Education provided Yes   Education Details safe TENs use   Person(s) Educated Patient   Methods Explanation;Verbal cues   Comprehension Verbalized understanding;Returned demonstration;Verbal cues required             PT Long Term Goals - 03/20/15 1047    PT LONG TERM GOAL #1   Title Patient will be independent in safe application and use of home TENs unit to increase tolerance with ADLs by 03/24/15   Time 1   Period Weeks   Status Achieved               Plan - 2015-03-20 1044    Clinical Impression Statement 44 yo Female presents to therapy with increased thoracic back pain 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 Patient reports mild-moderate tenderness along left scapular paraspinals with increased tightness. She exhibits good BUE and BLE AROM and strength. Patient was educated on safe TENs use and application. She does not demonstrate need for additional skilled PT intervention at this time.    Pt will benefit from skilled therapeutic intervention in order to improve on the following deficits Hypomobility;Decreased activity tolerance;Increased fascial restricitons;Pain;Decreased mobility;Postural dysfunction;Impaired flexibility   Rehab Potential Good   Clinical Impairments Affecting Rehab Potential positive: motivation, negative: chronic progressive condition   PT Frequency One time visit   PT Treatment/Interventions Electrical Stimulation;Patient/family education   PT Home Exercise Plan educated patient on safe Home TENs use   Consulted and Agree with Plan of Care Patient          G-Codes - 2015-03-20 1048    Functional Assessment Tool Used clinical judgement, strength/ROM   Functional Limitation Mobility:  Walking and moving around   Mobility: Walking and Moving Around Current Status [phone]) At least 20 percent but less than 40 percent impaired, limited or restricted   Mobility: Walking and Moving Around Goal Status [phone]) At least 20 percent but less than 40 percent impaired, limited or restricted   Mobility: Walking and Moving Around Discharge Status [phone]) At least 20 percent but less than 40 percent impaired, limited or restricted       Problem List Patient Active Problem List   Diagnosis Date Noted  . Back pain of thoracolumbar region 02/18/2015  . Neurofibromatosis, type 1 (von Recklinghausen's disease) ([place]) 02/18/2015  . Idiopathic scoliosis 02/18/2015    Trotter,Margaret PT, DPT Mar 20, 2015, 10:54 AM  [place]  [facility] MAIN [facility] SERVICES [place], [place], [address] Phone: [phone]   Fax:  [phone]  Name: [name] [name] MRN: [id number] Date of Birth: [birth year]

## 2015-03-20 ENCOUNTER — Other Ambulatory Visit: Payer: Self-pay | Admitting: Anesthesiology

## 2015-03-23 ENCOUNTER — Encounter: Payer: Self-pay | Admitting: Anesthesiology

## 2015-03-25 DIAGNOSIS — M546 Pain in thoracic spine: Secondary | ICD-10-CM | POA: Diagnosis not present

## 2015-03-25 DIAGNOSIS — Q8501 Neurofibromatosis, type 1: Secondary | ICD-10-CM | POA: Diagnosis not present

## 2015-04-21 ENCOUNTER — Ambulatory Visit: Payer: Medicare PPO | Attending: Anesthesiology | Admitting: Anesthesiology

## 2015-04-21 ENCOUNTER — Encounter: Payer: Self-pay | Admitting: Anesthesiology

## 2015-04-21 VITALS — BP 135/107 | HR 126 | Temp 98.6°F | Resp 16 | Ht 61.0 in | Wt 150.0 lb

## 2015-04-21 DIAGNOSIS — G8929 Other chronic pain: Secondary | ICD-10-CM | POA: Insufficient documentation

## 2015-04-21 DIAGNOSIS — G546 Phantom limb syndrome with pain: Secondary | ICD-10-CM | POA: Diagnosis not present

## 2015-04-21 DIAGNOSIS — Q8500 Neurofibromatosis, unspecified: Secondary | ICD-10-CM | POA: Diagnosis not present

## 2015-04-21 DIAGNOSIS — Q8501 Neurofibromatosis, type 1: Secondary | ICD-10-CM | POA: Diagnosis not present

## 2015-04-21 DIAGNOSIS — M546 Pain in thoracic spine: Secondary | ICD-10-CM

## 2015-04-21 DIAGNOSIS — M4134 Thoracogenic scoliosis, thoracic region: Secondary | ICD-10-CM | POA: Diagnosis not present

## 2015-04-21 DIAGNOSIS — M545 Low back pain, unspecified: Secondary | ICD-10-CM

## 2015-04-21 DIAGNOSIS — R52 Pain, unspecified: Secondary | ICD-10-CM | POA: Diagnosis present

## 2015-04-21 DIAGNOSIS — M4120 Other idiopathic scoliosis, site unspecified: Secondary | ICD-10-CM | POA: Diagnosis not present

## 2015-04-21 MED ORDER — OXYCODONE-ACETAMINOPHEN 5-325 MG PO TABS: 1.0000 | ORAL_TABLET | Freq: Two times a day (BID) | ORAL | Status: DC

## 2015-04-21 NOTE — Progress Notes (Signed)
Safety precautions to be maintained throughout the outpatient stay will include: orient to surroundings, keep bed in low position, maintain call bell within reach at all times, provide assistance with transfer out of bed and ambulation.  

## 2015-04-21 NOTE — Patient Instructions (Signed)
Please bring someone who can drive you home.

## 2015-04-21 NOTE — Progress Notes (Signed)
   Subjective:    Patient ID: [name] [name], female    DOB: [birth year], 45 y.o.   MRN: [id number]  HPI This patient returned to the clinic today indicating that her pain which is usually at an intensity of 50% is currently at 80% This is because she has been withouthr medicaion which as being pescibed to her by Dr. Danelle Berry at [facility] She now has her transfer F from [facility] indicating that they would not be given her any more pain medications but that we would be the sole provider of her pain medications Accordingly Will begin her on Roxicet 5/325 mg tablets 1 twice a day after meals She appears to be reasonably comfortable and is not in any distress She has a diagnosis of frontal Recklinghausen's disease and neurofibromatosis which is the etiology of her pain along with idiopathic scoliosis She is able to perform her activities of daily living   Review of Systems  Constitutional: Negative.   HENT: Negative.   Eyes: Negative.   Respiratory: Negative.   Cardiovascular: Negative.   Gastrointestinal: Negative.   Endocrine: Negative.   Genitourinary: Negative.   Musculoskeletal: Negative.   Skin: Negative.   Allergic/Immunologic: Negative.   Neurological: Negative.        This patient has generalized pain with back pain secondary to upon Recklinghausen's disease or neurofibromatosis and also has idiopathic scoliosis with neuropathic implications  Hematological: Negative.   Psychiatric/Behavioral: Negative.        Objective:   Physical Exam  Cardiovascular:  This patient appears to be relaxed and is in no distress A blood pressure is 135/107 mmHg Pulse is 1 26 bpm Equal and regular Heart sounds 1 and 2 were heard in all areas There were no audible murmurs Temperature is 98.35F Respirations are 16 breaths per minute SPO2 was 100% Chest is clinically clear There are no adventitious sounds Abdomen is soft and nontender There are no palpable organomegaly No  significant lymphadenopathy Pupils are equal and reactive Cranial nerves are intact There are no new neurological or musculoskeletal findings  Nursing note and vitals reviewed.         Assessment & Plan:   Assessment 1 Chronic thoracolumbar back pain 2 von Recklinghausen's disease / neurofibromatosis 3 idiopathic scoliosis   Plan of management 1 Will begin the patient on Roxicet 5/325 mg 1 tablet twice a day when necessary for pain and was given 60 tablets 2 Will plan an IV lidocaine infusion for her at the next visit 3 to return to the clinic in 1 month's time   Established patient       level III   Lance Bosch M.D.

## 2015-04-23 ENCOUNTER — Telehealth: Payer: Self-pay | Admitting: Anesthesiology

## 2015-04-23 NOTE — Telephone Encounter (Signed)
Spoke with patient re; phone message.  We are not the prescriber for her Tramadol.  She will check with her PCP.

## 2015-04-23 NOTE — Telephone Encounter (Signed)
Tramadol was sent to different pharmacy / [facility] is the only one she uses can this be sent to correct pharmacy

## 2015-05-20 ENCOUNTER — Ambulatory Visit: Payer: Medicare PPO | Attending: Anesthesiology | Admitting: Anesthesiology

## 2015-05-20 VITALS — BP 104/82 | HR 99 | Temp 98.8°F | Resp 16 | Ht 61.5 in | Wt 149.0 lb

## 2015-05-20 DIAGNOSIS — M545 Low back pain, unspecified: Secondary | ICD-10-CM

## 2015-05-20 DIAGNOSIS — Q8500 Neurofibromatosis, unspecified: Secondary | ICD-10-CM | POA: Diagnosis not present

## 2015-05-20 DIAGNOSIS — M4120 Other idiopathic scoliosis, site unspecified: Secondary | ICD-10-CM

## 2015-05-20 DIAGNOSIS — Z760 Encounter for issue of repeat prescription: Secondary | ICD-10-CM | POA: Diagnosis not present

## 2015-05-20 DIAGNOSIS — Q8501 Neurofibromatosis, type 1: Secondary | ICD-10-CM

## 2015-05-20 DIAGNOSIS — M546 Pain in thoracic spine: Secondary | ICD-10-CM | POA: Diagnosis not present

## 2015-05-20 DIAGNOSIS — M4134 Thoracogenic scoliosis, thoracic region: Secondary | ICD-10-CM | POA: Diagnosis not present

## 2015-05-20 MED ORDER — OXYCODONE-ACETAMINOPHEN 5-325 MG PO TABS: 1.0000 | ORAL_TABLET | Freq: Two times a day (BID) | ORAL | Status: DC

## 2015-05-20 MED ORDER — OXYCODONE-ACETAMINOPHEN 5-325 MG PO TABS: 1.0000 | ORAL_TABLET | ORAL | Status: DC | PRN

## 2015-05-20 MED ORDER — SODIUM CHLORIDE 0.9 % IJ SOLN
INTRAMUSCULAR | Status: AC
  Filled 2015-05-20: qty 10

## 2015-05-20 MED ORDER — DIAZEPAM 5 MG PO TABS
ORAL_TABLET | ORAL | Status: AC
  Administered 2015-05-20: 10 mg via ORAL
  Filled 2015-05-20: qty 2

## 2015-05-20 MED ORDER — LIDOCAINE IN D5W 4-5 MG/ML-% IV SOLN
INTRAVENOUS | Status: AC
  Administered 2015-05-20: 15:00:00 via INTRAVENOUS
  Filled 2015-05-20: qty 500

## 2015-05-20 NOTE — Progress Notes (Signed)
   Subjective:    Patient ID: [name] [name], female    DOB: February 26, 1971, 45 y.o.   MRN: [id number]  HPI    Review of Systems     Objective:   Physical Exam        Assessment & Plan:  The patient was given Roxicet 5/325 mg twice a day when necessary and she was given 60 tablets  Lance Bosch M.D.

## 2015-05-20 NOTE — Patient Instructions (Signed)
Please bring someone who can drive you home.

## 2015-05-20 NOTE — Progress Notes (Signed)
   Subjective:    Patient ID: [name] [name], female    DOB: 1970/07/04, 45 y.o.   MRN: [id number]  HPI    Review of Systems     Objective:   Physical Exam        Assessment & Plan:   Procedure note IV lidocaine infusion  Date of procedure  May 20, 2015  Surgeon Lance Bosch M.D.  Informed consent was obtained and the the benefits and  Risks of this procedure  The patient was placed on the procedure bed in the supine position and intravenous access was established Patient was weighed and her weight was ecstasy septum kilogram D 4 mg/kgose of lidocaine to be administered was 4 mg/kg andand this turned out to be 270 mg of lidocaine This volume of lidocaine [270 mg] was added to 250 cc or 5% dextrose  Water At this time the patient was attache the customary monitoring monitors including electrocardiogram pul automatic blood pressure measurement and the patient was attended to by a nurse who was in constant attendance The patient was alerted to be on the look out for tinnitus in the areas metallic taste in the mouth and a metallic tastes in the tongue 10 minutes before the infusion was commenced the patient was given 10 mg of diazepam orally To 10 minutes the infusion was start  Intensity by the nurse in attendance under lookout for tor significant neurological findings The lidocaine local volume of 270 mg in 250 cc of 5% dextrose an intravenously uneventfully. At the end of the infusion the patienno adverse events which occurred. The patient was observed for another and at the end of that observation, there were no  Complications or side effects Vital signs were stable The patient was then discharged home with her family and will follow up with her in 4 month   [place].D.

## 2015-05-21 ENCOUNTER — Telehealth: Payer: Self-pay | Admitting: *Deleted

## 2015-05-21 NOTE — Telephone Encounter (Signed)
Left voicemail to call our office if there are any questions or concerns re; procedure on yesterday.

## 2015-06-11 DIAGNOSIS — F411 Generalized anxiety disorder: Secondary | ICD-10-CM | POA: Diagnosis not present

## 2015-06-11 DIAGNOSIS — I10 Essential (primary) hypertension: Secondary | ICD-10-CM | POA: Diagnosis not present

## 2015-06-11 DIAGNOSIS — Q8509 Other neurofibromatosis: Secondary | ICD-10-CM | POA: Diagnosis not present

## 2015-06-11 DIAGNOSIS — E782 Mixed hyperlipidemia: Secondary | ICD-10-CM | POA: Diagnosis not present

## 2015-06-16 ENCOUNTER — Ambulatory Visit: Payer: Medicare PPO | Attending: Anesthesiology | Admitting: Anesthesiology

## 2015-06-16 ENCOUNTER — Encounter: Payer: Self-pay | Admitting: Anesthesiology

## 2015-06-16 VITALS — BP 115/80 | HR 77 | Temp 98.3°F | Resp 16 | Ht 61.0 in | Wt 155.0 lb

## 2015-06-16 DIAGNOSIS — Q8501 Neurofibromatosis, type 1: Secondary | ICD-10-CM

## 2015-06-16 DIAGNOSIS — M4134 Thoracogenic scoliosis, thoracic region: Secondary | ICD-10-CM | POA: Diagnosis not present

## 2015-06-16 DIAGNOSIS — M545 Low back pain, unspecified: Secondary | ICD-10-CM

## 2015-06-16 DIAGNOSIS — M546 Pain in thoracic spine: Secondary | ICD-10-CM | POA: Diagnosis not present

## 2015-06-16 DIAGNOSIS — Q8500 Neurofibromatosis, unspecified: Secondary | ICD-10-CM | POA: Diagnosis not present

## 2015-06-16 DIAGNOSIS — Z79899 Other long term (current) drug therapy: Secondary | ICD-10-CM | POA: Diagnosis not present

## 2015-06-16 DIAGNOSIS — M4120 Other idiopathic scoliosis, site unspecified: Secondary | ICD-10-CM

## 2015-06-16 MED ORDER — OXYCODONE-ACETAMINOPHEN 5-325 MG PO TABS: 1.0000 | ORAL_TABLET | Freq: Two times a day (BID) | ORAL | Status: DC | PRN

## 2015-06-16 MED ORDER — DIAZEPAM 5 MG PO TABS
ORAL_TABLET | ORAL | Status: AC
  Administered 2015-06-16: 10 mg via ORAL
  Filled 2015-06-16: qty 2

## 2015-06-16 MED ORDER — LIDOCAINE HCL (CARDIAC) 20 MG/ML IV SOLN
INTRAVENOUS | Status: AC
  Administered 2015-06-16: 350 mg via INTRAVENOUS
  Filled 2015-06-16: qty 5

## 2015-06-16 NOTE — Progress Notes (Signed)
Safety precautions to be maintained throughout the outpatient stay will include: orient to surroundings, keep bed in low position, maintain call bell within reach at all times, provide assistance with transfer out of bed and ambulation.  

## 2015-06-16 NOTE — Progress Notes (Signed)
   Subjective:    Patient ID: [name] [name], female    DOB: 1970-08-02, 45 y.o.   MRN: [id number]  HPI    Review of Systems     Objective:   Physical Exam        Assessment & Plan:    Date of procedure 06/16/2015  Name of procedure Intravenous lidocaine infusion  Informed consent was obtained and the patient appeared to accept and understand the benefits and risks of this procedure She was taken to the procedure room and was weighed Her weight was 70 kg The dose prescribed for her of lidocaine was 5 mg/kg Thus,  the dose to be administered was 350 mg Patient was placed in the supine position and intravenous access was established The patient was attached to the customary monitors including electrocardiogram pulse oximetry automatic blood pressure monitoring machine and pulse monitor The patient was given 10 mg of diazepam orally about 10 minutes before starting the infusion 350 mg of lidocaine were added to 250 cc of 5% dextrose water The infusion was began and was carried out for one hour Diet the infusion the patient was advised to inform us of any tinnitus or circum-aural numbness or metallic taste which she might in counter since these may be precursors to a grand mal seizure During the infusion she had none of these symptoms At the end of the infusion she was observed for another one hour Was none of those symptoms and her vital signs were stable There were no complications He was discharged home with her Oxycet 5/325 mg 1 tablet 1 to twice a day when necessary and she was given 50 tablets Follow-up with her in 1 month   [place].D.

## 2015-06-17 ENCOUNTER — Telehealth: Payer: Self-pay | Admitting: *Deleted

## 2015-06-17 NOTE — Telephone Encounter (Signed)
No problems post procedure. 

## 2015-06-22 DIAGNOSIS — N390 Urinary tract infection, site not specified: Secondary | ICD-10-CM | POA: Diagnosis not present

## 2015-06-22 DIAGNOSIS — Z124 Encounter for screening for malignant neoplasm of cervix: Secondary | ICD-10-CM | POA: Diagnosis not present

## 2015-06-22 DIAGNOSIS — F411 Generalized anxiety disorder: Secondary | ICD-10-CM | POA: Diagnosis not present

## 2015-06-22 DIAGNOSIS — E782 Mixed hyperlipidemia: Secondary | ICD-10-CM | POA: Diagnosis not present

## 2015-06-22 DIAGNOSIS — Z0001 Encounter for general adult medical examination with abnormal findings: Secondary | ICD-10-CM | POA: Diagnosis not present

## 2015-07-10 DIAGNOSIS — M79606 Pain in leg, unspecified: Secondary | ICD-10-CM | POA: Insufficient documentation

## 2015-07-15 ENCOUNTER — Encounter: Payer: Self-pay | Admitting: Anesthesiology

## 2015-07-15 ENCOUNTER — Ambulatory Visit: Payer: Medicare PPO | Attending: Anesthesiology | Admitting: Anesthesiology

## 2015-07-15 VITALS — BP 127/82 | HR 90 | Temp 98.4°F | Resp 16 | Ht 61.0 in | Wt 155.0 lb

## 2015-07-15 DIAGNOSIS — M4134 Thoracogenic scoliosis, thoracic region: Secondary | ICD-10-CM | POA: Diagnosis not present

## 2015-07-15 DIAGNOSIS — M545 Low back pain: Secondary | ICD-10-CM | POA: Insufficient documentation

## 2015-07-15 DIAGNOSIS — M546 Pain in thoracic spine: Secondary | ICD-10-CM | POA: Diagnosis not present

## 2015-07-15 DIAGNOSIS — G8929 Other chronic pain: Secondary | ICD-10-CM | POA: Insufficient documentation

## 2015-07-15 DIAGNOSIS — Q8501 Neurofibromatosis, type 1: Secondary | ICD-10-CM | POA: Insufficient documentation

## 2015-07-15 DIAGNOSIS — M549 Dorsalgia, unspecified: Secondary | ICD-10-CM | POA: Diagnosis present

## 2015-07-15 DIAGNOSIS — M4120 Other idiopathic scoliosis, site unspecified: Secondary | ICD-10-CM | POA: Insufficient documentation

## 2015-07-15 DIAGNOSIS — Q8500 Neurofibromatosis, unspecified: Secondary | ICD-10-CM | POA: Insufficient documentation

## 2015-07-15 MED ORDER — TRAMADOL HCL 50 MG PO TABS
50.0000 mg | ORAL_TABLET | Freq: Two times a day (BID) | ORAL | Status: DC | PRN
Start: 2015-07-15 — End: 2015-10-02

## 2015-07-15 MED ORDER — MEXILETINE HCL 150 MG PO CAPS: 150.0000 mg | ORAL_CAPSULE | Freq: Three times a day (TID) | ORAL | Status: DC

## 2015-07-15 NOTE — Progress Notes (Signed)
   Subjective:    Patient ID: [name] [name], female    DOB: Feb 14, 1971, 45 y.o.   MRN: [id number]  HPI  This patient returned to the clinic today indicating that she is doing well and is functioning quite well She indicates that she has had 2 intravenous lidocaine infusions and she is much better than before she had the infusions Today her subjective pain intensity rating is 60% We offered the option of having 2 more infusions or having the mexiletine therapy and she opted to have the mexiletine therapy I will therefore suspend intravenous lidocaine infusion and begin mexiletine therapy I would also supplement her analgesia by given her some tramadol to take when necessary if there is any exacerbation of her pain  Review of Systems  Constitutional: Negative.   HENT: Negative.   Eyes: Negative.   Respiratory: Negative.   Cardiovascular: Negative.   Gastrointestinal: Negative.   Endocrine: Negative.   Genitourinary: Negative.   Musculoskeletal: Positive for myalgias, back pain, arthralgias and gait problem. Negative for joint swelling, neck pain and neck stiffness.       She also has idiopathic scoliosis which compounds with neurofibromatosis making her back pain more severe  Allergic/Immunologic: Negative.   Neurological:       This patient has extensive neurofibromatosis otherwise known as Von Recklinghausen's disease  Hematological: Negative.   Psychiatric/Behavioral: Negative.        Objective:   Physical Exam  Cardiovascular:  She appears to be in no distress Subjective pain intensity rating is 60% Her blood pressure is 127/82 mmHg Pulse is 90 bpm Equal and regular Heart sounds 1 and 2 were heard in all areas There were no audible murmurs Temperature is 98.2F Respirations are 18 breaths per minute SPO2 was 100% Chest is clinically clear There are no adventitious sounds Abdomen is soft and nontender There is no palpable organomegaly There is no significant  lymphadenopathy Pupils are equal and reactive Cranial nerves are intact The significant nasal and facial deformities as a result of the neurofibromatosis  Nursing note and vitals reviewed.         Assessment & Plan:   Assessment 1 chronic thoracolumbar: Lumbar back pain 2 neurofibromatosis of Von Recklinghausen's disease 3 idiopathic scoliosis   Plan of management 1 Will defer intravenous lidocaine infusions were time being 2 Will begin her on mexiletine 150 mg 3 times a day for a period of 2 months 3 Will give her tramadol 50 mg to be taken 1-2 tablets when necessary for exacerbation of pain 4 Will follow-up with her in 2 months   Established patient     level III    Lance Bosch M.D.

## 2015-07-15 NOTE — Patient Instructions (Signed)
You were given a prescription for Mexitil and for Tramadol today.

## 2015-07-15 NOTE — Progress Notes (Signed)
Safety precautions to be maintained throughout the outpatient stay will include: orient to surroundings, keep bed in low position, maintain call bell within reach at all times, provide assistance with transfer out of bed and ambulation.  

## 2015-08-10 DIAGNOSIS — E782 Mixed hyperlipidemia: Secondary | ICD-10-CM | POA: Diagnosis not present

## 2015-08-10 DIAGNOSIS — E784 Other hyperlipidemia: Secondary | ICD-10-CM | POA: Diagnosis not present

## 2015-08-10 DIAGNOSIS — I10 Essential (primary) hypertension: Secondary | ICD-10-CM | POA: Diagnosis not present

## 2015-08-10 DIAGNOSIS — F411 Generalized anxiety disorder: Secondary | ICD-10-CM | POA: Diagnosis not present

## 2015-08-31 ENCOUNTER — Telehealth: Payer: Self-pay | Admitting: *Deleted

## 2015-08-31 NOTE — Telephone Encounter (Signed)
lm on vm making the pt aware that her appt for 09/16/15 @ 8:30 am is cancelled due to Dr. Idelia Salm will be out of the office. i asked pt to retun my call so i can get her r/s.Marland KitchenMarland KitchenTD

## 2015-09-15 ENCOUNTER — Telehealth: Payer: Self-pay | Admitting: Anesthesiology

## 2015-09-15 NOTE — Telephone Encounter (Signed)
Med refill appt was canceled by dr Idelia Salm, could she get meds called in or pick up script until her appt on the 30th? Patient informed dr Idelia Salm would not be back until 10-02-15

## 2015-09-16 ENCOUNTER — Encounter: Payer: Self-pay | Admitting: Anesthesiology

## 2015-09-26 ENCOUNTER — Encounter: Payer: Self-pay | Admitting: Emergency Medicine

## 2015-09-26 ENCOUNTER — Emergency Department: Payer: Medicare PPO

## 2015-09-26 ENCOUNTER — Other Ambulatory Visit: Payer: Self-pay

## 2015-09-26 ENCOUNTER — Emergency Department
Admission: EM | Admit: 2015-09-26 | Discharge: 2015-09-26 | Disposition: A | Discharge status: Home / Self Care | Payer: Medicare PPO | Attending: Emergency Medicine | Admitting: Emergency Medicine

## 2015-09-26 DIAGNOSIS — I10 Essential (primary) hypertension: Secondary | ICD-10-CM | POA: Insufficient documentation

## 2015-09-26 DIAGNOSIS — R0789 Other chest pain: Secondary | ICD-10-CM | POA: Diagnosis not present

## 2015-09-26 DIAGNOSIS — K029 Dental caries, unspecified: Secondary | ICD-10-CM

## 2015-09-26 DIAGNOSIS — R079 Chest pain, unspecified: Secondary | ICD-10-CM

## 2015-09-26 DIAGNOSIS — Z79899 Other long term (current) drug therapy: Secondary | ICD-10-CM | POA: Insufficient documentation

## 2015-09-26 DIAGNOSIS — K0889 Other specified disorders of teeth and supporting structures: Secondary | ICD-10-CM | POA: Diagnosis not present

## 2015-09-26 LAB — COMPREHENSIVE METABOLIC PANEL
ALT: 17 U/L (ref 14–54)
ANION GAP: 13 (ref 5–15)
AST: 16 U/L (ref 15–41)
Albumin: 4.9 g/dL (ref 3.5–5.0)
Alkaline Phosphatase: 133 U/L — ABNORMAL HIGH (ref 38–126)
BUN: 14 mg/dL (ref 6–20)
CHLORIDE: 104 mmol/L (ref 101–111)
CO2: 23 mmol/L (ref 22–32)
Calcium: 9.9 mg/dL (ref 8.9–10.3)
Creatinine, Ser: 0.64 mg/dL (ref 0.44–1.00)
GFR calc non Af Amer: >60 mL/min (ref >60)
Glucose, Bld: 89 mg/dL (ref 65–99)
Potassium: 3.3 mmol/L — ABNORMAL LOW (ref 3.5–5.1)
SODIUM: 140 mmol/L (ref 135–145)
Total Bilirubin: 0.9 mg/dL (ref 0.3–1.2)
Total Protein: 9.1 g/dL — ABNORMAL HIGH (ref 6.5–8.1)

## 2015-09-26 LAB — CBC WITH DIFFERENTIAL/PLATELET
Basophils Absolute: 0 10*3/uL (ref 0–0.1)
Basophils Relative: 1 %
Eosinophils Absolute: 0.1 10*3/uL (ref 0–0.7)
Eosinophils Relative: 1 %
HEMATOCRIT: 41.5 % (ref 35.0–47.0)
HEMOGLOBIN: 13.6 g/dL (ref 12.0–16.0)
LYMPHS ABS: 1.7 10*3/uL (ref 1.0–3.6)
Lymphocytes Relative: 38 %
MCH: 29.4 pg (ref 26.0–34.0)
MCHC: 32.8 g/dL (ref 32.0–36.0)
MCV: 89.7 fL (ref 80.0–100.0)
MONOS PCT: 7 %
Monocytes Absolute: 0.3 10*3/uL (ref 0.2–0.9)
NEUTROS ABS: 2.3 10*3/uL (ref 1.4–6.5)
NEUTROS PCT: 53 %
PLATELETS: 314 10*3/uL (ref 150–440)
RBC: 4.62 MIL/uL (ref 3.80–5.20)
RDW: 13.6 % (ref 11.5–14.5)
WBC: 4.4 10*3/uL (ref 3.6–11.0)

## 2015-09-26 LAB — TROPONIN I

## 2015-09-26 MED ORDER — CLINDAMYCIN HCL 150 MG PO CAPS: ORAL_CAPSULE | ORAL | Status: DC

## 2015-09-26 NOTE — ED Provider Notes (Signed)
-----------------------------------------   4:14 PM on 09/26/2015 -----------------------------------------  EKG reviewed and interpreted, so shows sinus tachycardia 106 bpm, narrow QRS, normal axis, normal intervals, nonspecific but no concerning ST changes. No ST elevation.  Harvest Dark, MD 09/26/15 [phone]

## 2015-09-26 NOTE — ED Provider Notes (Signed)
[facility] Emergency Department Provider Note   ____________________________________________  Time seen: Approximately 1:20 PM  I have reviewed the triage vital signs and the nursing notes.   HISTORY  Chief Complaint Dental Pain    HPI [name] [name] is a 45 y.o. female is here with complaint of left sided lower dental pain for 1 1/2 weeks.  Patient states she has not been taking any over-the-counter medication for this pain. She states she has also had left-sided chest pain which she attributes to her dental pain. Patient states her left-sided chest pain also radiates to the left side of her jaw which she believes to be dental related. This apparently has been going on for 2 days. Patient states that she woke up completely sweaty. She denies any shortness of breath. Patient does have a history of hypertensionand states that she continues to take her medication as prescribed. She admits to nausea but no vomiting. She denies any fever or chills. Currently she rates her pain as a 6/10.     Past Medical History  Diagnosis Date  . 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 Hypertension     controlled  . Thyroid disease     controlled  . Scoliosis of thoracic spine     "C" curve, convex to left    Patient Active Problem List   Diagnosis Date Noted  . Back pain of thoracolumbar region 02/18/2015  . 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 Idiopathic scoliosis 02/18/2015    Past Surgical History  Procedure Laterality Date  . Cesarean section    . Chamberlain procedure    . Thyroidectomy      Current Outpatient Rx  Name  Route  Sig  Dispense  Refill  . acetaminophen (TYLENOL) 500 MG tablet   Oral   Take 500 mg by mouth every 8 (eight) hours as needed. 3 tabs         . albuterol (PROVENTIL HFA;VENTOLIN HFA) 108 (90 BASE) MCG/ACT inhaler   Inhalation   Inhale into the lungs every  6 (six) hours as needed for wheezing or shortness of breath.         Marland Kitchen amitriptyline (ELAVIL) 100 MG tablet   Oral   Take 100 mg by mouth at bedtime.         . clindamycin (CLEOCIN) 150 MG capsule      Take 2 capsules qid for infection   56 capsule   0   . gabapentin (NEURONTIN) 600 MG tablet   Oral   Take 600 mg by mouth 3 (three) times daily.         . metoprolol (LOPRESSOR) 100 MG tablet   Oral   Take 100 mg by mouth daily.         Marland Kitchen mexiletine (MEXITIL) 150 MG capsule   Oral   Take 1 capsule (150 mg total) by mouth 3 (three) times daily.   90 capsule   1   . oxyCODONE-acetaminophen (ROXICET) 5-325 MG tablet   Oral   Take 1 tablet by mouth 2 (two) times daily after a meal.   60 tablet   0   . oxyCODONE-acetaminophen (ROXICET) 5-325 MG tablet   Oral   Take 1 tablet by mouth 2 (two) times daily between meals as needed for severe pain.   50 tablet   0   . ranitidine (ZANTAC) 150 MG tablet   Oral  Take 150 mg by mouth daily.         . simvastatin (ZOCOR) 40 MG tablet   Oral   Take 40 mg by mouth daily.         . traMADol (ULTRAM) 50 MG tablet   Oral   Take 1 tablet (50 mg total) by mouth every 12 (twelve) hours as needed. Reported on 06/16/2015   45 tablet   0     Allergies Peanuts and Penicillins  Family History  Problem Relation Age of Onset  . Breast cancer Neg Hx   . Cancer Mother     Social History Social History  Substance Use Topics  . Smoking status: Never Smoker   . Smokeless tobacco: None     Comment: this patient is a nonsmoker   . Alcohol Use: No    Review of Systems Constitutional: No fever/chills Eyes: No visual changes. ENT: No sore throat.Positive dental pain. Cardiovascular: Positive left-sided chest pain. Respiratory: Denies shortness of breath. Gastrointestinal: No abdominal pain.  Positive nausea, no vomiting.   Genitourinary: Negative for dysuria. Musculoskeletal: Negative for back pain. Skin: Negative for  rash. Neurological: Negative for headaches, focal weakness or numbness.  10-point ROS otherwise negative.  ____________________________________________   PHYSICAL EXAM:  VITAL SIGNS: ED Triage Vitals  Enc Vitals Group     BP 09/26/15 1303 137/92 mmHg     Pulse Rate 09/26/15 1303 104     Resp 09/26/15 1303 18     Temp 09/26/15 1303 98.6 F (37 C)     Temp Source 09/26/15 1303 Oral     SpO2 09/26/15 1303 98 %     Weight 09/26/15 1303 150 lb (68.04 kg)     Height 09/26/15 1303 5\' 1"  (1.549 m)     Head Cir --      Peak Flow --      Pain Score 09/26/15 1304 6     Pain Loc --      Pain Edu? --      Excl. in [place]? --     Constitutional: Alert and oriented. Well appearing and in no acute distress. Eyes: Conjunctivae are normal. PERRL. EOMI. Head: Atraumatic. Nose: No congestion/rhinnorhea. Mouth/Throat: Mucous membranes are moist.  Oropharynx non-erythematous.Left lower premolar with only a portion of tooth extending out of the gum line. There is minimal edema for the gum. There is a forming pustule on the molar next to this cavity. There is some minimal tenderness on palpation of the gum. No facial edema is appreciated. Neck: No stridor.   Hematological/Lymphatic/Immunilogical: No cervical lymphadenopathy. Cardiovascular: Normal rate, regular rhythm. Grossly normal heart sounds.  Good peripheral circulation. Respiratory: Normal respiratory effort.  No retractions. Lungs CTAB. Gastrointestinal: Soft and nontender. No distention. Musculoskeletal: No lower extremity tenderness nor edema.  No joint effusions. Neurologic:  Normal speech and language. No gross focal neurologic deficits are appreciated. No gait instability. Skin:  Skin is warm, dry and intact. No rash noted. Psychiatric: Mood and affect are normal. Speech and behavior are normal.  ____________________________________________   LABS (all labs ordered are listed, but only abnormal results are displayed)  Labs Reviewed   COMPREHENSIVE METABOLIC PANEL - Abnormal; Notable for the following:    Potassium 3.3 (*)    Total Protein 9.1 (*)    Alkaline Phosphatase 133 (*)    All other components within normal limits  CBC WITH DIFFERENTIAL/PLATELET  TROPONIN I   ____________________________________________  EKG  Per Dr. Kerman Passey ____________________________________________  [place]  Chest x-ray  per radiologist shows no evidence of acute cardiopulmonary disease. ____________________________________________   PROCEDURES  Procedure(s) performed: None  Critical Care performed: No  ____________________________________________   INITIAL IMPRESSION / ASSESSMENT AND PLAN / ED COURSE  Pertinent labs & imaging results that were available during my care of the patient were reviewed by me and considered in my medical decision making (see chart for details).  Patient was given a prescription for clindamycin 150 mg 2 capsules 4 times a day for dental infection for the next 7 days. Patient will continue taking Tylenol and ibuprofen as needed for pain. She is calm and compliant with a dentist as soon as possible. She is also encouraged to follow-up with either [facility] clinic or doctor for choice for further evaluation of her today chest pain. She was given the results of her tests today but encouraged to see her medical doctor. ____________________________________________   FINAL CLINICAL IMPRESSION(S) / ED DIAGNOSES  Final diagnoses:  Chest pain of unknown etiology  Pain due to dental caries      NEW MEDICATIONS STARTED DURING THIS VISIT:  Discharge Medication List as of 09/26/2015  4:11 PM    START taking these medications   Details  clindamycin (CLEOCIN) 150 MG capsule Take 2 capsules qid for infection, Print         Note:  This document was prepared using Dragon voice recognition software and may include unintentional dictation errors.    Johnn Hai, PA-C 09/26/15  1646  Daymon Larsen, MD 09/27/15 [phone]

## 2015-09-26 NOTE — ED Notes (Signed)
L tooth pain x 1 1/2 week

## 2015-09-26 NOTE — Discharge Instructions (Signed)
Follow-up with your primary care doctor about any continued chest wall pain. Begin taking antibiotics, Cleocin for infection. Continued Tylenol or ibuprofen as needed for dental pain. Call and make an appointment with a dentist for treatment of your teeth.

## 2015-09-26 NOTE — ED Notes (Signed)
Discussed discharge instructions, prescriptions, and follow-up care with patient. No questions or concerns at this time. Pt stable at discharge.  

## 2015-10-02 ENCOUNTER — Other Ambulatory Visit: Payer: Self-pay | Admitting: *Deleted

## 2015-10-02 MED ORDER — TRAMADOL HCL 50 MG PO TABS: 50.0000 mg | ORAL_TABLET | Freq: Two times a day (BID) | ORAL | Status: DC | PRN

## 2015-10-02 NOTE — Telephone Encounter (Signed)
Patient states she needs Tramadol. Informed her that we will call it in to pharmacy.

## 2015-10-09 ENCOUNTER — Ambulatory Visit: Payer: Medicare PPO | Attending: Anesthesiology | Admitting: Anesthesiology

## 2015-10-09 ENCOUNTER — Encounter: Payer: Self-pay | Admitting: Anesthesiology

## 2015-10-09 VITALS — BP 113/77 | HR 89 | Temp 98.6°F | Resp 16 | Ht 61.0 in | Wt 150.0 lb

## 2015-10-09 DIAGNOSIS — M545 Low back pain, unspecified: Secondary | ICD-10-CM

## 2015-10-09 DIAGNOSIS — M4120 Other idiopathic scoliosis, site unspecified: Secondary | ICD-10-CM | POA: Insufficient documentation

## 2015-10-09 DIAGNOSIS — Q8500 Neurofibromatosis, unspecified: Secondary | ICD-10-CM | POA: Insufficient documentation

## 2015-10-09 DIAGNOSIS — M546 Pain in thoracic spine: Secondary | ICD-10-CM | POA: Insufficient documentation

## 2015-10-09 DIAGNOSIS — Q8501 Neurofibromatosis, type 1: Secondary | ICD-10-CM

## 2015-10-09 DIAGNOSIS — R52 Pain, unspecified: Secondary | ICD-10-CM | POA: Diagnosis present

## 2015-10-09 DIAGNOSIS — G8929 Other chronic pain: Secondary | ICD-10-CM | POA: Insufficient documentation

## 2015-10-09 DIAGNOSIS — M4134 Thoracogenic scoliosis, thoracic region: Secondary | ICD-10-CM | POA: Diagnosis not present

## 2015-10-09 NOTE — Progress Notes (Signed)
   Subjective:    Patient ID: [name] [name], female    DOB: 08/28/1970, 45 y.o.   MRN: [id number]  HPI   this patient returned to the clinic today indicating that she is doing quite well  her subjective  Pain intensity ratng is 40%  she indicates that the intravenous lidocaine infusion is helping her He has had 2 already She is also  On tramadol  Which helps control her pain quite nicely.  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   HENT: Negative.   Eyes: Negative.   Respiratory: Negative.   Cardiovascular: Negative.   Gastrointestinal: Negative.   Endocrine: Negative.   Genitourinary: Negative.   Musculoskeletal: Negative.   Skin: Negative.   Allergic/Immunologic: Negative.   Neurological: Negative.        This patient has idiopathic scoliosis associated with Von reckinghauser's disease or neurofibromatosis  Hematological: Negative.   Psychiatric/Behavioral: Negative.        Objective:   Physical Exam  Cardiovascular:  This patient appears to be in no distress Her subjective pain intensity rating is 50% Her vital signs are stable Blood pressure is 113/77 mmHg Her respirations are 15 breaths per minute Her pulse is 89 bpm SPO2 was 100% Her temperature is 98.24F There are no new neurological no musculoskeletal findings  Nursing note and vitals reviewed.         Assessment & Plan:    assessment  1  Chronic thoracolumbar pain  2 neurofibromatosis 3 Idiopathic scoliosis    plan of management Will plan to continue the tramadol  Will plan another intravenous lidocane infusion in the next 2-3 weeks     established patient    Level III   Lance Bosch M.D.

## 2015-10-09 NOTE — Patient Instructions (Signed)
Bring a driver for the Lidocaine infusion.

## 2015-10-09 NOTE — Progress Notes (Signed)
Safety precautions to be maintained throughout the outpatient stay will include: orient to surroundings, keep bed in low position, maintain call bell within reach at all times, provide assistance with transfer out of bed and ambulation.  

## 2015-10-12 DIAGNOSIS — Q8509 Other neurofibromatosis: Secondary | ICD-10-CM | POA: Diagnosis not present

## 2015-10-12 DIAGNOSIS — I10 Essential (primary) hypertension: Secondary | ICD-10-CM | POA: Diagnosis not present

## 2015-10-12 DIAGNOSIS — E782 Mixed hyperlipidemia: Secondary | ICD-10-CM | POA: Diagnosis not present

## 2015-10-14 ENCOUNTER — Encounter: Payer: Self-pay | Admitting: *Deleted

## 2015-10-14 ENCOUNTER — Other Ambulatory Visit: Payer: Self-pay | Admitting: Internal Medicine

## 2015-10-14 DIAGNOSIS — Z1231 Encounter for screening mammogram for malignant neoplasm of breast: Secondary | ICD-10-CM

## 2015-10-23 ENCOUNTER — Ambulatory Visit: Payer: Self-pay | Admitting: Anesthesiology

## 2015-10-26 ENCOUNTER — Ambulatory Visit: Payer: Self-pay | Admitting: General Surgery

## 2015-11-05 ENCOUNTER — Ambulatory Visit: Payer: Self-pay | Admitting: General Surgery

## 2015-11-12 DIAGNOSIS — Q8509 Other neurofibromatosis: Secondary | ICD-10-CM | POA: Diagnosis not present

## 2015-11-12 DIAGNOSIS — M797 Fibromyalgia: Secondary | ICD-10-CM | POA: Diagnosis not present

## 2015-11-12 DIAGNOSIS — E782 Mixed hyperlipidemia: Secondary | ICD-10-CM | POA: Diagnosis not present

## 2015-11-12 DIAGNOSIS — I10 Essential (primary) hypertension: Secondary | ICD-10-CM | POA: Diagnosis not present

## 2015-12-07 ENCOUNTER — Ambulatory Visit: Payer: Medicare PPO | Attending: Internal Medicine

## 2016-02-11 ENCOUNTER — Ambulatory Visit
Admission: RE | Admit: 2016-02-11 | Discharge: 2016-02-11 | Disposition: A | Discharge status: Home / Self Care | Payer: Medicare Other | Source: Ambulatory Visit | Attending: Internal Medicine | Admitting: Internal Medicine

## 2016-02-11 DIAGNOSIS — Z1231 Encounter for screening mammogram for malignant neoplasm of breast: Secondary | ICD-10-CM | POA: Diagnosis present

## 2016-08-24 ENCOUNTER — Emergency Department
Admission: EM | Admit: 2016-08-24 | Discharge: 2016-08-25 | Disposition: A | Discharge status: Home / Self Care | Payer: Medicare Other | Attending: Emergency Medicine | Admitting: Emergency Medicine

## 2016-08-24 ENCOUNTER — Encounter: Payer: Self-pay | Admitting: Emergency Medicine

## 2016-08-24 ENCOUNTER — Emergency Department: Payer: Medicare Other

## 2016-08-24 DIAGNOSIS — Z79899 Other long term (current) drug therapy: Secondary | ICD-10-CM | POA: Diagnosis not present

## 2016-08-24 DIAGNOSIS — Z91010 Allergy to peanuts: Secondary | ICD-10-CM | POA: Insufficient documentation

## 2016-08-24 DIAGNOSIS — H5711 Ocular pain, right eye: Secondary | ICD-10-CM | POA: Diagnosis not present

## 2016-08-24 DIAGNOSIS — E079 Disorder of thyroid, unspecified: Secondary | ICD-10-CM | POA: Insufficient documentation

## 2016-08-24 DIAGNOSIS — H9191 Unspecified hearing loss, right ear: Secondary | ICD-10-CM | POA: Insufficient documentation

## 2016-08-24 DIAGNOSIS — I10 Essential (primary) hypertension: Secondary | ICD-10-CM | POA: Insufficient documentation

## 2016-08-24 DIAGNOSIS — R51 Headache: Secondary | ICD-10-CM | POA: Diagnosis not present

## 2016-08-24 LAB — CBC WITH DIFFERENTIAL/PLATELET
Basophils Absolute: 0.1 10*3/uL (ref 0–0.1)
Basophils Relative: 1 %
EOS PCT: 1 %
Eosinophils Absolute: 0.1 10*3/uL (ref 0–0.7)
HEMATOCRIT: 36.3 % (ref 35.0–47.0)
Hemoglobin: 12.1 g/dL (ref 12.0–16.0)
LYMPHS ABS: 2.4 10*3/uL (ref 1.0–3.6)
LYMPHS PCT: 33 %
MCH: 29.1 pg (ref 26.0–34.0)
MCHC: 33.4 g/dL (ref 32.0–36.0)
MCV: 86.9 fL (ref 80.0–100.0)
MONO ABS: 0.7 10*3/uL (ref 0.2–0.9)
MONOS PCT: 9 %
NEUTROS ABS: 4.1 10*3/uL (ref 1.4–6.5)
Neutrophils Relative %: 56 %
PLATELETS: 277 10*3/uL (ref 150–440)
RBC: 4.18 MIL/uL (ref 3.80–5.20)
RDW: 13.6 % (ref 11.5–14.5)
WBC: 7.2 10*3/uL (ref 3.6–11.0)

## 2016-08-24 LAB — BASIC METABOLIC PANEL
ANION GAP: 8 (ref 5–15)
BUN: 14 mg/dL (ref 6–20)
CHLORIDE: 107 mmol/L (ref 101–111)
CO2: 28 mmol/L (ref 22–32)
Calcium: 9.5 mg/dL (ref 8.9–10.3)
Creatinine, Ser: 0.58 mg/dL (ref 0.44–1.00)
GFR calc Af Amer: >60 mL/min (ref >60)
GLUCOSE: 94 mg/dL (ref 65–99)
POTASSIUM: 3.6 mmol/L (ref 3.5–5.1)
Sodium: 143 mmol/L (ref 135–145)

## 2016-08-24 MED ORDER — GADOBENATE DIMEGLUMINE 529 MG/ML IV SOLN
15.0000 mL | Freq: Once | INTRAVENOUS | Status: AC | PRN
  Administered 2016-08-24: 13 mL via INTRAVENOUS

## 2016-08-24 NOTE — ED Notes (Signed)
Patient transported to CT 

## 2016-08-24 NOTE — ED Triage Notes (Signed)
Pt reports hearing loss that began in right ear five days ago. Pt reports headache behind her right eye that began last night. Facial symmetry intact. Bilateral hand grips strong. Pt alert and oriented, ambulatory to triage. No apparent distress noted.

## 2016-08-24 NOTE — ED Provider Notes (Signed)
[facility] Emergency Department Provider Note  ____________________________________________   First MD Initiated Contact with Patient 08/24/16 1959     (approximate)  I have reviewed the triage vital signs and the nursing notes.   HISTORY  Chief Complaint Headache and Hearing Loss    HPI [name] [name] is a 46 y.o. female with a history of neurofibromatosis type I who presents for evaluation of gradual onset hearing loss in the right side over the last 5 days.  She states that there is no pain in her ear although she does have pain behind her right eye as well that seems to radiate towards the ear.  She has had no visual disturbances including no double vision or decreased visual acuity of which she is aware.  She has no hearing loss in her right ear.  She denies headache, neck pain, numbness or tingling or weakness in any of her extremities, chest pain, nausea, vomiting, abdominal pain, dysuria.  She has otherwise been in her normal state of health.  Theonset of the hearing loss has been gradual but is severe.  Nothing makes it better nor worse.   Past Medical History:  Diagnosis Date  . Hypertension    controlled  . Neurofibromatosis ([place])    since birth; contributes to back pain  . Scoliosis of thoracic spine    "C" curve, convex to left  . Thyroid disease    controlled    Patient Active Problem List   Diagnosis Date Noted  . Back pain of thoracolumbar region 02/18/2015  . 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 Idiopathic scoliosis 02/18/2015    Past Surgical History:  Procedure Laterality Date  . CESAREAN SECTION    . chamberlain procedure    . THYROIDECTOMY      Prior to Admission medications   Medication Sig Start Date End Date Taking? Authorizing Provider  acetaminophen (TYLENOL) 500 MG tablet Take 500 mg by mouth every 8 (eight) hours as needed. 3 tabs    [provider]  albuterol  (PROVENTIL HFA;VENTOLIN HFA) 108 (90 BASE) MCG/ACT inhaler Inhale into the lungs every 6 (six) hours as needed for wheezing or shortness of breath.    [provider]  amitriptyline (ELAVIL) 100 MG tablet Take 100 mg by mouth at bedtime.    [provider]  clindamycin (CLEOCIN) 150 MG capsule Take 2 capsules qid for infection Patient not taking: Reported on 10/09/2015 09/26/15   Johnn Hai, PA-C  gabapentin (NEURONTIN) 600 MG tablet Take 600 mg by mouth 3 (three) times daily.    [provider]  metoprolol (LOPRESSOR) 100 MG tablet Take 100 mg by mouth daily.    [provider]  mexiletine (MEXITIL) 150 MG capsule Take 1 capsule (150 mg total) by mouth 3 (three) times daily. 07/15/15   Lance Bosch, MD  oxyCODONE-acetaminophen (ROXICET) 5-325 MG tablet Take 1 tablet by mouth 2 (two) times daily after a meal. Patient not taking: Reported on 10/09/2015 05/20/15   Lance Bosch, MD  oxyCODONE-acetaminophen (ROXICET) 5-325 MG tablet Take 1 tablet by mouth 2 (two) times daily between meals as needed for severe pain. Patient not taking: Reported on 10/09/2015 06/16/15   Lance Bosch, MD  ranitidine (ZANTAC) 150 MG tablet Take 150 mg by mouth daily.    [provider]  simvastatin (ZOCOR) 40 MG tablet Take 40 mg by mouth daily.    [provider]  traMADol (ULTRAM) 50 MG tablet Take 1  tablet (50 mg total) by mouth every 12 (twelve) hours as needed. Reported on 06/16/2015 10/02/15   Lance Bosch, MD    Allergies Peanuts [peanut oil] and Penicillins  Family History  Problem Relation Age of Onset  . Cancer Mother   . Breast cancer Neg Hx     Social History Social History  Substance Use Topics  . Smoking status: Never Smoker  . Smokeless tobacco: Not on file     Comment: this patient is a nonsmoker   . Alcohol use No    Review of Systems Constitutional: No fever/chills Eyes: No Visual changes, but pain behind the right eye ENT: No  sore throat. Hearing loss of the right ear over the last 5 days Cardiovascular: Denies chest pain. Respiratory: Denies shortness of breath. Gastrointestinal: No abdominal pain.  No nausea, no vomiting.  No diarrhea.  No constipation. Genitourinary: Negative for dysuria. Musculoskeletal: Negative for neck pain.  Negative for back pain. Integumentary: Negative for rash. Neurological: The relatively rapid hearing loss in her right ear with some pain behind the right eye radiating to the ear.  No focal numbness nor weakness in her extremities.   ____________________________________________   PHYSICAL EXAM:  VITAL SIGNS: ED Triage Vitals  Enc Vitals Group     BP 08/24/16 1854 (!) 174/106     Pulse Rate 08/24/16 1854 82     Resp 08/24/16 1854 18     Temp 08/24/16 1854 99.1 F (37.3 C)     Temp Source 08/24/16 1854 Oral     SpO2 08/24/16 1854 100 %     Weight 08/24/16 1855 148 lb (67.1 kg)     Height 08/24/16 1855 5' 1.5" (1.562 m)     Head Circumference --      Peak Flow --      Pain Score 08/24/16 1855 7     Pain Loc --      Pain Edu? --      Excl. in [place]? --     Constitutional: Alert and oriented. Well appearing and in no acute distress. Eyes: Conjunctivae are normal. PERRL. EOMI. Head: Atraumatic. Ears:  Cerumenous ear canals bilaterally without ability to visualize the TMs Nose: No congestion/rhinnorhea. Mouth/Throat: Mucous membranes are moist. Neck: No stridor.  No meningeal signs.   Cardiovascular: Normal rate, regular rhythm. Good peripheral circulation. Grossly normal heart sounds. Respiratory: Normal respiratory effort.  No retractions. Lungs CTAB. Gastrointestinal: Soft and nontender. No distention.  Musculoskeletal: No lower extremity tenderness nor edema. No gross deformities of extremities. Neurologic:  Normal speech and language.  No facial droop.  Hearing loss in right ear.  Normal extremity strength throughout.  No dysmetria. Skin:  Skin is warm, dry and  intact. No rash noted.  Extensive neurofibromatosis tumors throughout skin surface. Psychiatric: Mood and affect are normal. Speech and behavior are normal.  ____________________________________________   LABS (all labs ordered are listed, but only abnormal results are displayed)  Labs Reviewed  BASIC METABOLIC PANEL  CBC WITH DIFFERENTIAL/PLATELET   ____________________________________________  EKG  None - EKG not ordered by ED physician ____________________________________________  RADIOLOGY   Mr Albertina [place] Wo Contrast  Result Date: 08/24/2016 CLINICAL DATA:  Hearing loss on the right. Right high pain. Headache. EXAM: MRI HEAD WITHOUT AND WITH CONTRAST TECHNIQUE: Multiplanar, multiecho pulse sequences of the brain and surrounding structures were obtained without and with intravenous contrast. CONTRAST:  42mL MULTIHANCE GADOBENATE DIMEGLUMINE 529 MG/ML IV SOLN COMPARISON:  Head CT 06/24/2008 FINDINGS: Brain: There is 8 mm  of inferior cerebellar tonsillar ectopia. Posterior fossa is crowded. There is no focal diffusion restriction to indicate acute infarct. There is a hyperintense T2 weighted signal lesion within the right body of the corpus callosum, measuring 8 x 6 mm. A a smaller lesion in the contralateral frontal lobe white matter measures 4 x 3 mm. There is an even smaller lesion in the parasagittal left frontal lobe measuring 2 mm. The remainder the brain parenchymal signal is normal. The lesions do not enhance following contrast administration. No intraparenchymal hematoma or chronic microhemorrhage. Brain volume is normal for age without age-advanced or lobar predominant atrophy. The dura is normal and there is no extra-axial collection. Internal Auditory Canals: There is no cerebellopontine angle mass. There is mild contrast enhancement in the left internal auditory canal that has avascular morphology and may be the anterior inferior cerebellar artery. The cochleae and semicircular  canals are normal. No focal abnormality along the course of the 7th and 8th cranial nerves. Normal porus acusticus and vestibular aqueduct bilaterally. Vascular: Major intracranial arterial and venous sinus flow voids are preserved. Skull and upper cervical spine: The visualized skull base, calvarium, upper cervical spine and extracranial soft tissues are normal. Sinuses/Orbits: No fluid levels or advanced mucosal thickening. No mastoid effusion. Normal orbits. IMPRESSION: 1. Nonenhancing T2 hyperintense foci within the corpus callosum and left frontal white matter, most consistent with lesions of neurofibromatosis. 2. Crowding of the posterior fossa and 8 mm of inferior cerebellar tonsillar ectopia, consistent with Chiari malformation. 3. Normal MRI of the right internal auditory canal. Enhancement in the left IAC is likely a normal vessel. 4. No acute abnormality. Electronically Signed   By: Ulyses Jarred M.D.   On: 08/24/2016 23:11    ____________________________________________   PROCEDURES  Critical Care performed: No   Procedure(s) performed:   Procedures   ____________________________________________   INITIAL IMPRESSION / ASSESSMENT AND PLAN / ED COURSE  Pertinent labs & imaging results that were available during my care of the patient were reviewed by me and considered in my medical decision making (see chart for details).   Clinical Course as of Aug 25 116  Wed Aug 24, 2016  2013 Patient does not have any peripheral neurological deficits or evidence of CVA.  Given the history of neurofibromatosis and the hearing loss, I spoke by phone with Dr. Jeannine Boga with radiology who recommended a specific MR brain/IAC with and without contrast protocol.  I will check basic blood work to verify the patient's kidney function and then proceed with the imaging.  [CF]  Thu Aug 25, 2016  0117 MRI was reassuring with no evidence of any acute abnormality or any abnormality that would lead to  the hearing loss.  I updated the patient.  I will have the nurse irrigate her ears to see if this helps but she will need to follow with ENT and/or neurology for further evaluation.  The patient is comfortable with this plan.  She has no other focal neurological deficits.  [CF]    Clinical Course User Index [CF] Hinda Kehr, MD    ____________________________________________  FINAL CLINICAL IMPRESSION(S) / ED DIAGNOSES  Final diagnoses:  Acute right eye pain  Acute hearing loss of right ear     MEDICATIONS GIVEN DURING THIS VISIT:  Medications  gadobenate dimeglumine (MULTIHANCE) injection 15 mL (13 mLs Intravenous Contrast Given 08/24/16 2237)     NEW OUTPATIENT MEDICATIONS STARTED DURING THIS VISIT:  Discharge Medication List as of 08/25/2016 12:43 AM  Discharge Medication List as of 08/25/2016 12:43 AM      Discharge Medication List as of 08/25/2016 12:43 AM       Note:  This document was prepared using Dragon voice recognition software and may include unintentional dictation errors.    Hinda Kehr, MD 08/25/16 [phone]

## 2016-08-24 NOTE — ED Notes (Signed)
Pt complains of a headache behind right eye and hearing loss to right ear starting on Saturday, pt denies any other symptoms

## 2016-08-24 NOTE — ED Notes (Signed)
Patient transported to MRI 

## 2016-08-25 NOTE — Discharge Instructions (Signed)
As we discussed, your MRI was reassuring in that there is no acute or obvious cause for your recent hearing loss on the right side.  We recommend that you follow-up with ENT for further evaluation, as well as with your primary care doctor for possible referral to neurology for additional evaluation.  Return to the emergency department if you develop new or worsening symptoms that concern you.

## 2016-08-25 NOTE — ED Notes (Signed)
Ears irrigated with peroxide and saline per MD order, no ear wax in return

## 2016-12-23 ENCOUNTER — Emergency Department
Admission: EM | Admit: 2016-12-23 | Discharge: 2016-12-23 | Disposition: A | Discharge status: Home / Self Care | Payer: Medicare Other | Attending: Emergency Medicine | Admitting: Emergency Medicine

## 2016-12-23 ENCOUNTER — Encounter: Payer: Self-pay | Admitting: Emergency Medicine

## 2016-12-23 ENCOUNTER — Emergency Department: Payer: Medicare Other

## 2016-12-23 DIAGNOSIS — Q8501 Neurofibromatosis, type 1: Secondary | ICD-10-CM | POA: Insufficient documentation

## 2016-12-23 DIAGNOSIS — Z91010 Allergy to peanuts: Secondary | ICD-10-CM | POA: Insufficient documentation

## 2016-12-23 DIAGNOSIS — R519 Headache, unspecified: Secondary | ICD-10-CM

## 2016-12-23 DIAGNOSIS — R51 Headache: Secondary | ICD-10-CM | POA: Insufficient documentation

## 2016-12-23 DIAGNOSIS — I10 Essential (primary) hypertension: Secondary | ICD-10-CM | POA: Insufficient documentation

## 2016-12-23 LAB — CBC WITH DIFFERENTIAL/PLATELET
Basophils Absolute: 0.1 10*3/uL (ref 0–0.1)
Basophils Relative: 1 %
EOS PCT: 0 %
Eosinophils Absolute: 0 10*3/uL (ref 0–0.7)
HCT: 39.1 % (ref 35.0–47.0)
HEMOGLOBIN: 13.1 g/dL (ref 12.0–16.0)
LYMPHS ABS: 1.5 10*3/uL (ref 1.0–3.6)
LYMPHS PCT: 18 %
MCH: 28.9 pg (ref 26.0–34.0)
MCHC: 33.6 g/dL (ref 32.0–36.0)
MCV: 86 fL (ref 80.0–100.0)
MONOS PCT: 6 %
Monocytes Absolute: 0.5 10*3/uL (ref 0.2–0.9)
Neutro Abs: 6 10*3/uL (ref 1.4–6.5)
Neutrophils Relative %: 75 %
Platelets: 347 10*3/uL (ref 150–440)
RBC: 4.55 MIL/uL (ref 3.80–5.20)
RDW: 13.3 % (ref 11.5–14.5)
WBC: 8.1 10*3/uL (ref 3.6–11.0)

## 2016-12-23 LAB — COMPREHENSIVE METABOLIC PANEL
ALBUMIN: 4.2 g/dL (ref 3.5–5.0)
ALT: 30 U/L (ref 14–54)
AST: 21 U/L (ref 15–41)
Alkaline Phosphatase: 140 U/L — ABNORMAL HIGH (ref 38–126)
Anion gap: 7 (ref 5–15)
BUN: 24 mg/dL — AB (ref 6–20)
CHLORIDE: 104 mmol/L (ref 101–111)
CO2: 28 mmol/L (ref 22–32)
CREATININE: 0.98 mg/dL (ref 0.44–1.00)
Calcium: 9.4 mg/dL (ref 8.9–10.3)
GFR calc Af Amer: >60 mL/min (ref >60)
Glucose, Bld: 115 mg/dL — ABNORMAL HIGH (ref 65–99)
Potassium: 4.5 mmol/L (ref 3.5–5.1)
SODIUM: 139 mmol/L (ref 135–145)
Total Bilirubin: 0.6 mg/dL (ref 0.3–1.2)
Total Protein: 8.5 g/dL — ABNORMAL HIGH (ref 6.5–8.1)

## 2016-12-23 LAB — TROPONIN I: Troponin I: <0.03 ng/mL (ref <0.03)

## 2016-12-23 MED ORDER — KETOROLAC TROMETHAMINE 30 MG/ML IJ SOLN
30.0000 mg | Freq: Once | INTRAMUSCULAR | Status: AC
  Administered 2016-12-23: 30 mg via INTRAVENOUS
  Filled 2016-12-23: qty 1

## 2016-12-23 MED ORDER — METOCLOPRAMIDE HCL 5 MG/ML IJ SOLN
10.0000 mg | Freq: Once | INTRAMUSCULAR | Status: AC
  Administered 2016-12-23: 10 mg via INTRAVENOUS
  Filled 2016-12-23: qty 2

## 2016-12-23 NOTE — ED Notes (Signed)
Pt family at bedside

## 2016-12-23 NOTE — ED Notes (Signed)
Pt A&Ox4, noted to be stuttering, which per family is abnormal.

## 2016-12-23 NOTE — ED Provider Notes (Signed)
[facility] Emergency Department Provider Note       Time seen: ----------------------------------------- 10:39 AM on 12/23/2016 -----------------------------------------     I have reviewed the triage vital signs and the nursing notes.   HISTORY   Chief Complaint Headache and Shaking    HPI [name] [name] is a 46 y.o. female who presents to the ED for headache for the past 2 days. Patient arrives by EMS from home for headache as well as stuttering and weakness. Patient arrives able to follow commands and answers questions properly. She complains of diffuse headache that is 9 out of 10. Nothing makes it better or worse. 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   Past Medical History:  Diagnosis Date  . Hypertension    controlled  . 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 Scoliosis of thoracic spine    "C" curve, convex to left  . Thyroid disease    controlled    Patient Active Problem List   Diagnosis Date Noted  . Back pain of thoracolumbar region 02/18/2015  . 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 Idiopathic scoliosis 02/18/2015    Past Surgical History:  Procedure Laterality Date  . CESAREAN SECTION    . chamberlain procedure    . THYROIDECTOMY      Allergies Peanuts [peanut oil] and Penicillins  Social History Social History  Substance Use Topics  . Smoking status: Never Smoker  . Smokeless tobacco: Never Used     Comment: this patient is a nonsmoker   . Alcohol use No    Review of Systems Constitutional: Negative for fever. Eyes: Negative for vision changes ENT:  Negative for congestion, sore throat Cardiovascular: Negative for chest pain. Respiratory: Negative for shortness of breath. Gastrointestinal: Negative for abdominal pain, vomiting and diarrhea. Genitourinary: Negative for dysuria. Musculoskeletal: Negative for back pain. Skin:  Negative for rash. Neurological:Positive for headache, weakness, stuttering  All systems negative/normal/unremarkable except as stated in the HPI  ____________________________________________   PHYSICAL EXAM:  VITAL SIGNS: ED Triage Vitals  Enc Vitals Group     BP 12/23/16 1026 (!) 127/97     Pulse Rate 12/23/16 1026 85     Resp 12/23/16 1026 20     Temp 12/23/16 1026 98.3 F (36.8 C)     Temp Source 12/23/16 1026 Oral     SpO2 12/23/16 1026 100 %     Weight 12/23/16 1027 154 lb 9.6 oz (70.1 kg)     Height 12/23/16 1027 5\' 1"  (1.549 m)     Head Circumference --      Peak Flow --      Pain Score 12/23/16 1026 9     Pain Loc --      Pain Edu? --      Excl. in [place]? --     Constitutional: Alert and oriented. Well appearing and in no distress. Eyes: Conjunctivae are normal. Normal extraocular movements. ENT   Head: Normocephalic and atraumatic.   Nose: No congestion/rhinnorhea.   Mouth/Throat: Mucous membranes are moist.   Neck: No stridor. Cardiovascular: Normal rate, regular rhythm. No murmurs, rubs, or gallops. Respiratory: Normal respiratory effort without tachypnea nor retractions. Breath sounds are clear and equal bilaterally. No wheezes/rales/rhonchi. Gastrointestinal: Soft and nontender. Normal bowel sounds Musculoskeletal: Nontender with normal range of motion in extremities. No lower extremity tenderness nor edema. Neurologic:  Intermittent stuttering speech, no gross focal neurologic deficits are appreciated. There does  appear to be some effort dependent weakness Skin:  Skin is warm, dry and intact. Neurofibromatosis lesions are appreciated across the face, trunk and extremities Psychiatric: Mood and affect are normal. Speech and behavior are normal.  ____________________________________________  EKG: Interpreted by me. Sinus rhythm rate 81 bpm, normal PR interval, normal QRS, normal QT.  ____________________________________________  ED  COURSE:  Pertinent labs & imaging results that were available during my care of the patient were reviewed by me and considered in my medical decision making (see chart for details). Patient presents for weakness, headache, we will assess with labs and imaging as indicated.   Procedures ____________________________________________   LABS (pertinent positives/negatives)  Labs Reviewed  COMPREHENSIVE METABOLIC PANEL - Abnormal; Notable for the following:       Result Value   Glucose, Bld 115 (*)    BUN 24 (*)    Total Protein 8.5 (*)    Alkaline Phosphatase 140 (*)    All other components within normal limits  CBC WITH DIFFERENTIAL/PLATELET  TROPONIN I  URINALYSIS, COMPLETE (UACMP) WITH MICROSCOPIC    RADIOLOGY Images were viewed by me  CT head  IMPRESSION: No acute intracranial abnormality. ____________________________________________  FINAL ASSESSMENT AND PLAN  Weakness, headache  Plan: Patient's labs and imaging were dictated above. Patient had presented for Weakness and shaking a lot of which appeared to be voluntary. Patient would have intermittent stuttering followed by long periods of normal speech. She has no focal neurologic deficits. Overall she appears stable for outpatient follow-up.   Earleen Newport, MD   Note: This note was generated in part or whole with voice recognition software. Voice recognition is usually quite accurate but there are transcription errors that can and very often do occur. I apologize for any typographical errors that were not detected and corrected.     Earleen Newport, MD 12/23/16 1328

## 2016-12-23 NOTE — ED Triage Notes (Signed)
Pt to ED via [facility] from home for headache x 2 days. Per EMS pt states that this morning she developed stuttering and a "tick" on the right side. Pt also smacking lips. Pt is able to follow commands and answers questions appropriately at this time. Pt does not appear to be in any distress.

## 2017-08-14 ENCOUNTER — Encounter: Payer: Self-pay | Admitting: Emergency Medicine

## 2017-08-14 ENCOUNTER — Other Ambulatory Visit: Payer: Self-pay

## 2017-08-14 ENCOUNTER — Emergency Department
Admission: EM | Admit: 2017-08-14 | Discharge: 2017-08-14 | Disposition: A | Discharge status: Home / Self Care | Payer: Medicare PPO | Attending: Emergency Medicine | Admitting: Emergency Medicine

## 2017-08-14 DIAGNOSIS — Y33XXXA Other specified events, undetermined intent, initial encounter: Secondary | ICD-10-CM | POA: Insufficient documentation

## 2017-08-14 DIAGNOSIS — Y999 Unspecified external cause status: Secondary | ICD-10-CM | POA: Insufficient documentation

## 2017-08-14 DIAGNOSIS — K047 Periapical abscess without sinus: Secondary | ICD-10-CM | POA: Insufficient documentation

## 2017-08-14 DIAGNOSIS — S025XXB Fracture of tooth (traumatic), initial encounter for open fracture: Secondary | ICD-10-CM | POA: Diagnosis present

## 2017-08-14 DIAGNOSIS — Y929 Unspecified place or not applicable: Secondary | ICD-10-CM | POA: Insufficient documentation

## 2017-08-14 DIAGNOSIS — I10 Essential (primary) hypertension: Secondary | ICD-10-CM | POA: Insufficient documentation

## 2017-08-14 DIAGNOSIS — Y939 Activity, unspecified: Secondary | ICD-10-CM | POA: Insufficient documentation

## 2017-08-14 DIAGNOSIS — Z79899 Other long term (current) drug therapy: Secondary | ICD-10-CM | POA: Diagnosis not present

## 2017-08-14 MED ORDER — CLINDAMYCIN HCL 300 MG PO CAPS: 300.0000 mg | ORAL_CAPSULE | Freq: Three times a day (TID) | ORAL | 0 refills | Status: AC

## 2017-08-14 NOTE — ED Triage Notes (Signed)
Says right lower tooth is bad.  Has  Had pain for few weeks and it is worsening;. She does not have a dentist.  ekg was cleared by md due to pat complained of jaw pain.

## 2017-08-14 NOTE — ED Provider Notes (Signed)
[facility] Emergency Department Provider Note  ____________________________________________  Time seen: Approximately 11:41 AM  I have reviewed the triage vital signs and the nursing notes.   HISTORY  Chief Complaint Dental Pain    HPI [name] [name] is a 47 y.o. female that presents to the emergency department for evaluation of right lower tooth pain for 1 week.  Patient states a tooth broke off 1 week ago.  She has tasted a funny taste in her mouth for the last week.  She is unsure of fever but has not had any chills.  The pain has made her nauseous.  No vomiting. She does not have a Pharmacist, community.    Past Medical History:  Diagnosis Date  . Hypertension    controlled  . 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 Scoliosis of thoracic spine    "C" curve, convex to left  . Thyroid disease    controlled    Patient Active Problem List   Diagnosis Date Noted  . Back pain of thoracolumbar region 02/18/2015  . 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 Idiopathic scoliosis 02/18/2015    Past Surgical History:  Procedure Laterality Date  . CESAREAN SECTION    . chamberlain procedure    . THYROIDECTOMY      Prior to Admission medications   Medication Sig Start Date End Date Taking? Authorizing Provider  acetaminophen (TYLENOL) 500 MG tablet Take 500 mg by mouth every 8 (eight) hours as needed. 3 tabs    [provider]  albuterol (PROVENTIL HFA;VENTOLIN HFA) 108 (90 BASE) MCG/ACT inhaler Inhale into the lungs every 6 (six) hours as needed for wheezing or shortness of breath.    [provider]  amitriptyline (ELAVIL) 100 MG tablet Take 100 mg by mouth at bedtime.    [provider]  clindamycin (CLEOCIN) 300 MG capsule Take 1 capsule (300 mg total) by mouth 3 (three) times daily for 10 days. 08/14/17 08/24/17  Laban Emperor, PA-C  gabapentin (NEURONTIN) 600 MG tablet Take  600 mg by mouth 3 (three) times daily.    [provider]  metoprolol (LOPRESSOR) 100 MG tablet Take 100 mg by mouth daily.    [provider]  mexiletine (MEXITIL) 150 MG capsule Take 1 capsule (150 mg total) by mouth 3 (three) times daily. 07/15/15   Lance Bosch, MD  oxyCODONE-acetaminophen (ROXICET) 5-325 MG tablet Take 1 tablet by mouth 2 (two) times daily after a meal. Patient not taking: Reported on 10/09/2015 05/20/15   Lance Bosch, MD  oxyCODONE-acetaminophen (ROXICET) 5-325 MG tablet Take 1 tablet by mouth 2 (two) times daily between meals as needed for severe pain. Patient not taking: Reported on 10/09/2015 06/16/15   Lance Bosch, MD  ranitidine (ZANTAC) 150 MG tablet Take 150 mg by mouth daily.    [provider]  simvastatin (ZOCOR) 40 MG tablet Take 40 mg by mouth daily.    [provider]  traMADol (ULTRAM) 50 MG tablet Take 1 tablet (50 mg total) by mouth every 12 (twelve) hours as needed. Reported on 06/16/2015 10/02/15   Lance Bosch, MD    Allergies Peanuts [peanut oil] and Penicillins  Family History  Problem Relation Age of Onset  . Cancer Mother   . Breast cancer Neg Hx     Social History Social History   Tobacco Use  . Smoking status: Never Smoker  . Smokeless tobacco: Never Used  . Tobacco comment:  this patient is a nonsmoker   Substance Use Topics  . Alcohol use: No    Alcohol/week: 0.0 oz  . Drug use: No     Review of Systems  Constitutional: No chills Cardiovascular: No chest pain. Respiratory: No SOB. Gastrointestinal: No abdominal pain.  No vomiting.  Musculoskeletal: Negative for musculoskeletal pain. Skin: Negative for rash, abrasions, lacerations, ecchymosis.   ____________________________________________   PHYSICAL EXAM:  VITAL SIGNS: ED Triage Vitals  Enc Vitals Group     BP 08/14/17 1046 131/84     Pulse Rate 08/14/17 1046 90     Resp 08/14/17 1046 14     Temp 08/14/17 1046 98.9 F (37.2  C)     Temp Source 08/14/17 1046 Oral     SpO2 08/14/17 1046 94 %     Weight 08/14/17 1047 145 lb (65.8 kg)     Height 08/14/17 1047 5\' 2"  (1.575 m)     Head Circumference --      Peak Flow --      Pain Score 08/14/17 1047 8     Pain Loc --      Pain Edu? --      Excl. in [place]? --      Constitutional: Alert and oriented. Well appearing and in no acute distress. Eyes: Conjunctivae are normal. PERRL. EOMI. Head: Atraumatic. ENT:      Ears:      Nose: No congestion/rhinnorhea.      Mouth/Throat: Mucous membranes are moist.  Poor dentition.  Fractured right bottom molar with surrounding tenderness to palpation.  No palpable abscess.  Neck: No stridor.   Cardiovascular: Normal rate, regular rhythm.  Good peripheral circulation. Respiratory: Normal respiratory effort without tachypnea or retractions. Lungs CTAB. Good air entry to the bases with no decreased or absent breath sounds. Gastrointestinal: Bowel sounds 4 quadrants. Soft and nontender to palpation. No guarding or rigidity. No palpable masses. No distention.  Musculoskeletal: Full range of motion to all extremities. No gross deformities appreciated. Neurologic:  Normal speech and language. No gross focal neurologic deficits are appreciated.  Skin:  Skin is warm, dry and intact. No rash noted.   ____________________________________________   LABS (all labs ordered are listed, but only abnormal results are displayed)  Labs Reviewed - No data to display ____________________________________________  EKG   ____________________________________________  RADIOLOGY  No results found.  ____________________________________________    PROCEDURES  Procedure(s) performed:    Procedures    Medications - No data to display   ____________________________________________   INITIAL IMPRESSION / ASSESSMENT AND PLAN / ED COURSE  Pertinent labs & imaging results that were available during my care of the patient were  reviewed by me and considered in my medical decision making (see chart for details).  Review of the [place] CSRS was performed in accordance of the [place] prior to dispensing any controlled drugs.   Patient's diagnosis is consistent with dental abscess and broken tooth.  Patient will be discharged home with prescriptions for clindamycin.  Patient is allergic to amoxicillin.  Dental resources were provided.  Patient is to follow up with dentist as directed. Patient is given ED precautions to return to the ED for any worsening or new symptoms.     ____________________________________________  FINAL CLINICAL IMPRESSION(S) / ED DIAGNOSES  Final diagnoses:  Dental abscess  Open fracture of tooth, initial encounter      NEW MEDICATIONS STARTED DURING THIS VISIT:  ED Discharge Orders        Ordered  clindamycin (CLEOCIN) 300 MG capsule  3 times daily     08/14/17 1148          This chart was dictated using voice recognition software/Dragon. Despite best efforts to proofread, errors can occur which can change the meaning. Any change was purely unintentional.    Laban Emperor, PA-C 08/14/17 1638    Darel Hong, MD 08/15/17 2205

## 2017-08-14 NOTE — ED Notes (Signed)
See triage note  Presents with pain to right lower jaw line  States she has a broken tooth and thinks she has an abscess

## 2017-08-14 NOTE — Discharge Instructions (Addendum)
OPTIONS FOR DENTAL FOLLOW UP CARE ° °[place] Department of Health and Human Services - Local Safety Net Dental Clinics °http://www.ncdhhs.gov/dph/oralhealth/services/safetynetclinics.htm °  °[facility] (336-562-3123) ° °[facility] [place] ([phone]) ° °[facility] ([phone] ext [phone]) ° °[facility] ([phone]) ° °[facility] ([phone]) °This clinic caters to the indigent population and is on a lottery system. °Location: °[facility], [address], [place] °Clinic Hours: °Wednesdays from 6pm - 9pm, patients seen by a lottery system. °For dates, call or go to [email or web address].edu/[facility]/patients/Dental-[facility] °Services: °Cleanings, fillings and simple extractions. °Payment Options: °DENTAL WORK IS FREE OF CHARGE. Bring proof of income or support. °Best way to get seen: °Arrive at 5:15 pm - this is a lottery, NOT first come/first serve, so arriving earlier will not increase your chances of being seen. °  °  °[facility] °[phone] °Select option 1 for emergencies °  °Location: °[facility], [address], [place] °Clinic Hours: °No walk-ins accepted - call the day before to schedule an appointment. °Check in times are 9:30 am and 1:30 pm. °Services: °Simple extractions, temporary fillings, pulpectomy/pulp debridement, uncomplicated abscess drainage. °Payment Options: °PAYMENT IS DUE AT THE TIME OF SERVICE.  Fee is usually $100-200, additional surgical procedures (e.g. abscess drainage) may be extra. °Cash, checks, Visa/MasterCard accepted.  Can file Medicaid if patient is covered for dental - patient should call case worker to check. °No discount for [facility] patients. °Best way to get seen: °MUST call the day before and get onto the schedule. Can usually be seen the next 1-2 days. No walk-ins accepted. °  °  °[facility] °[phone] °   °Location: °[facility], [address], [place] °Clinic Hours: °M, W, Th, F 8am or 1:30pm, Tues 9a or 1:30 - first come/first served. °Services: °Simple extractions, temporary fillings, uncomplicated abscess drainage.  You do not need to be an [place] resident. °Payment Options: °PAYMENT IS DUE AT THE TIME OF SERVICE. °Dental insurance, otherwise sliding scale - bring proof of income or support. °Depending on income and treatment needed, cost is usually $50-200. °Best way to get seen: °Arrive early as it is first come/first served. °  °  °[facility] °[phone] °  °Location: °[address] °Clinic Hours: °Mon-Thu 8a-5p °Services: °Most basic dental services including extractions and fillings. °Payment Options: °PAYMENT IS DUE AT THE TIME OF SERVICE. °Sliding scale, up to 50% off - bring proof if income or support. °Medicaid with dental option accepted. °Best way to get seen: °Call to schedule an appointment, can usually be seen within 2 weeks OR they will try to see walk-ins - show up at 8a or 2p (you may have to wait). °  °  °[facility] °[phone] °[place] RESIDENTS ONLY °  °Location: °[facility], [address], [place], [place] [address] °Clinic Hours: By appointment only. °Monday - Thursday 8am-5pm, Friday 8am-12pm °Services: Cleanings, fillings, extractions. °Payment Options: °PAYMENT IS DUE AT THE TIME OF SERVICE. °Cash, Visa or MasterCard. Sliding scale - $30 minimum per service. °Best way to get seen: °Come in to office, complete packet and make an appointment - need proof of income °or support monies for each household member and proof of [place] residence. °Usually takes about a month to get in. °  °  °[facility] °[phone] °  °Location: °[address]   [place] °Clinic Hours: Walk-in Urgent Care Dental Services are offered Monday-Friday  mornings only. °The numbers of emergencies accepted daily is limited to the number of °providers available. °Maximum 15 - Mondays, Wednesdays & Thursdays °Maximum 10 - Tuesdays & Fridays °Services: °You do not need to be a [place] resident to be seen for a dental emergency. °Emergencies are defined as pain, swelling, abnormal bleeding, or dental trauma. Walkins will receive x-rays if needed. °NOTE: Dental cleaning is not an emergency. °Payment Options: °PAYMENT IS DUE AT THE TIME OF SERVICE. °Minimum co-pay is $40.00 for uninsured patients. °Minimum co-pay is $3.00 for Medicaid with dental coverage. °Dental Insurance is accepted and must be presented at time of visit. °Medicare does not cover dental. °Forms of payment: Cash, credit card, checks. °Best way to get seen: °If not previously registered with the clinic, walk-in dental registration begins at 7:15 am and is on a first come/first serve basis. °If previously registered with the clinic, call to make an appointment. °  °  °[facility] °[phone] °[place] RESIDENTS ONLY °  °Location: °[address], [place], [place] °Clinic Hours: °Mon-Thu 10a-2p °Services: Extractions only! °Payment Options: °FREE (donations accepted) - bring proof of income or support °Best way to get seen: °Call and schedule an appointment OR come at 8am on the 1st Monday of every month (except for holidays) when it is first come/first served. °  °  °[facility] °[phone] °  °Location: °[address], [place] °Clinic Hours: °Friday mornings °Services, Payment Options, Best way to get seen: °Call for info °

## 2017-08-28 ENCOUNTER — Emergency Department: Payer: Medicare PPO

## 2017-08-28 ENCOUNTER — Encounter: Payer: Self-pay | Admitting: Emergency Medicine

## 2017-08-28 ENCOUNTER — Other Ambulatory Visit: Payer: Self-pay

## 2017-08-28 ENCOUNTER — Emergency Department
Admission: EM | Admit: 2017-08-28 | Discharge: 2017-08-28 | Disposition: A | Discharge status: Home / Self Care | Payer: Medicare PPO | Attending: Emergency Medicine | Admitting: Emergency Medicine

## 2017-08-28 DIAGNOSIS — R109 Unspecified abdominal pain: Secondary | ICD-10-CM | POA: Diagnosis present

## 2017-08-28 DIAGNOSIS — I10 Essential (primary) hypertension: Secondary | ICD-10-CM | POA: Insufficient documentation

## 2017-08-28 DIAGNOSIS — Z91010 Allergy to peanuts: Secondary | ICD-10-CM | POA: Insufficient documentation

## 2017-08-28 DIAGNOSIS — Z79899 Other long term (current) drug therapy: Secondary | ICD-10-CM | POA: Diagnosis not present

## 2017-08-28 LAB — COMPREHENSIVE METABOLIC PANEL
ALT: 26 U/L (ref 14–54)
ANION GAP: 8 (ref 5–15)
AST: 27 U/L (ref 15–41)
Albumin: 4.2 g/dL (ref 3.5–5.0)
Alkaline Phosphatase: 123 U/L (ref 38–126)
BUN: 12 mg/dL (ref 6–20)
CHLORIDE: 106 mmol/L (ref 101–111)
CO2: 24 mmol/L (ref 22–32)
Calcium: 9.3 mg/dL (ref 8.9–10.3)
Creatinine, Ser: 0.57 mg/dL (ref 0.44–1.00)
GFR calc Af Amer: >60 mL/min (ref >60)
Glucose, Bld: 89 mg/dL (ref 65–99)
POTASSIUM: 3.4 mmol/L — AB (ref 3.5–5.1)
Sodium: 138 mmol/L (ref 135–145)
Total Bilirubin: 1.1 mg/dL (ref 0.3–1.2)
Total Protein: 8.1 g/dL (ref 6.5–8.1)

## 2017-08-28 LAB — CBC
HEMATOCRIT: 35.1 % (ref 35.0–47.0)
HEMOGLOBIN: 12.1 g/dL (ref 12.0–16.0)
MCH: 30.4 pg (ref 26.0–34.0)
MCHC: 34.5 g/dL (ref 32.0–36.0)
MCV: 88.2 fL (ref 80.0–100.0)
Platelets: 317 10*3/uL (ref 150–440)
RBC: 3.98 MIL/uL (ref 3.80–5.20)
RDW: 13.9 % (ref 11.5–14.5)
WBC: 6 10*3/uL (ref 3.6–11.0)

## 2017-08-28 LAB — URINALYSIS, COMPLETE (UACMP) WITH MICROSCOPIC
BILIRUBIN URINE: NEGATIVE
Bacteria, UA: NONE SEEN
Glucose, UA: NEGATIVE mg/dL
HGB URINE DIPSTICK: NEGATIVE
KETONES UR: NEGATIVE mg/dL
NITRITE: NEGATIVE
Protein, ur: NEGATIVE mg/dL
Specific Gravity, Urine: 1.013 (ref 1.005–1.030)
pH: 6 (ref 5.0–8.0)

## 2017-08-28 MED ORDER — NITROFURANTOIN MONOHYD MACRO 100 MG PO CAPS: 100.0000 mg | ORAL_CAPSULE | Freq: Two times a day (BID) | ORAL | 0 refills | Status: AC

## 2017-08-28 NOTE — ED Provider Notes (Signed)
[facility] Emergency Department Provider Note   ____________________________________________    I have reviewed the triage vital signs and the nursing notes.   HISTORY  Chief Complaint Flank Pain     HPI [name] [name] is a 47 y.o. female who presents with complaints of flank pain.  Patient describes primarily right flank pain which is been ongoing over the last 48 hours.  She denies dysuria.  States she does have a history of kidney stones and this feels somewhat similar.  Denies hematuria.  No nausea or vomiting.  Has a history of cholecystectomy.  Has not taken anything for this.   Past Medical History:  Diagnosis Date  . Hypertension    controlled  . 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 Scoliosis of thoracic spine    "C" curve, convex to left  . Thyroid disease    controlled    Patient Active Problem List   Diagnosis Date Noted  . Back pain of thoracolumbar region 02/18/2015  . 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 Idiopathic scoliosis 02/18/2015    Past Surgical History:  Procedure Laterality Date  . CESAREAN SECTION    . chamberlain procedure    . THYROIDECTOMY      Prior to Admission medications   Medication Sig Start Date End Date Taking? Authorizing Provider  acetaminophen (TYLENOL) 500 MG tablet Take 500 mg by mouth every 8 (eight) hours as needed. 3 tabs    [provider]  albuterol (PROVENTIL HFA;VENTOLIN HFA) 108 (90 BASE) MCG/ACT inhaler Inhale into the lungs every 6 (six) hours as needed for wheezing or shortness of breath.    [provider]  amitriptyline (ELAVIL) 100 MG tablet Take 100 mg by mouth at bedtime.    [provider]  gabapentin (NEURONTIN) 600 MG tablet Take 600 mg by mouth 3 (three) times daily.    [provider]  metoprolol (LOPRESSOR) 100 MG tablet Take 100 mg by mouth daily.    [provider]  mexiletine (MEXITIL) 150 MG capsule Take 1 capsule (150 mg total) by mouth 3 (three) times daily. 07/15/15   Lance Bosch, MD  nitrofurantoin, macrocrystal-monohydrate, (MACROBID) 100 MG capsule Take 1 capsule (100 mg total) by mouth 2 (two) times daily for 7 days. 08/28/17 09/04/17  Lavonia Drafts, MD  oxyCODONE-acetaminophen (ROXICET) 5-325 MG tablet Take 1 tablet by mouth 2 (two) times daily after a meal. Patient not taking: Reported on 10/09/2015 05/20/15   Lance Bosch, MD  oxyCODONE-acetaminophen (ROXICET) 5-325 MG tablet Take 1 tablet by mouth 2 (two) times daily between meals as needed for severe pain. Patient not taking: Reported on 10/09/2015 06/16/15   Lance Bosch, MD  ranitidine (ZANTAC) 150 MG tablet Take 150 mg by mouth daily.    [provider]  simvastatin (ZOCOR) 40 MG tablet Take 40 mg by mouth daily.    [provider]  traMADol (ULTRAM) 50 MG tablet Take 1 tablet (50 mg total) by mouth every 12 (twelve) hours as needed. Reported on 06/16/2015 10/02/15   Lance Bosch, MD     Allergies Peanuts [peanut oil] and Penicillins  Family History  Problem Relation Age of Onset  . Cancer Mother   . Breast cancer Neg Hx     Social History Social History   Tobacco Use  . Smoking status: Never Smoker  . Smokeless tobacco: Never Used  . Tobacco comment: this patient is a  nonsmoker   Substance Use Topics  . Alcohol use: No    Alcohol/week: 0.0 oz  . Drug use: No    Review of Systems  Constitutional: Felt warm today Eyes: No visual changes.  ENT: No sore throat. Cardiovascular: Denies chest pain. Respiratory: Denies shortness of breath. Gastrointestinal: Right-sided flank pain Genitourinary: Negative for dysuria.  No hematuria Musculoskeletal: Negative for back pain. Skin: Negative for rash. Neurological: Negative for headaches    ____________________________________________   PHYSICAL EXAM:  VITAL SIGNS: ED Triage Vitals  Enc  Vitals Group     BP 08/28/17 1632 (!) 133/96     Pulse Rate 08/28/17 1632 (!) 110     Resp 08/28/17 1632 20     Temp 08/28/17 1632 98.8 F (37.1 C)     Temp Source 08/28/17 1632 Oral     SpO2 08/28/17 1632 97 %     Weight 08/28/17 1634 65.8 kg (145 lb)     Height 08/28/17 1634 1.575 m (5\' 2" )     Head Circumference --      Peak Flow --      Pain Score 08/28/17 1634 9     Pain Loc --      Pain Edu? --      Excl. in [place]? --     Constitutional: Alert and oriented. No acute distress. Pleasant and interactive Eyes: Conjunctivae are normal.   Nose: No congestion/rhinnorhea. Mouth/Throat: Mucous membranes are moist.    Cardiovascular: Normal rate, regular rhythm.   Good peripheral circulation. Respiratory: Normal respiratory effort.  No retractions.  Gastrointestinal: Soft and nontender. No distention.  No CVA tenderness. Genitourinary: deferred Musculoskeletal:   Warm and well perfused Neurologic:  Normal speech and language. No gross focal neurologic deficits are appreciated.  Skin:  Skin is warm, dry and intact. No rash noted. Psychiatric: Mood and affect are normal. Speech and behavior are normal.  ____________________________________________   LABS (all labs ordered are listed, but only abnormal results are displayed)  Labs Reviewed  COMPREHENSIVE METABOLIC PANEL - Abnormal; Notable for the following components:      Result Value   Potassium 3.4 (*)    All other components within normal limits  URINALYSIS, COMPLETE (UACMP) WITH MICROSCOPIC - Abnormal; Notable for the following components:   Color, Urine YELLOW (*)    APPearance HAZY (*)    Leukocytes, UA SMALL (*)    All other components within normal limits  URINE CULTURE  CBC   ____________________________________________  EKG   ____________________________________________  RADIOLOGY  CT renal stone study negative for kidney stone ____________________________________________   PROCEDURES  Procedure(s)  performed: No  Procedures   Critical Care performed: No ____________________________________________   INITIAL IMPRESSION / ASSESSMENT AND PLAN / ED COURSE  Pertinent labs & imaging results that were available during my care of the patient were reviewed by me and considered in my medical decision making (see chart for details).  Patient presents with complaints of flank pain, primarily on the right.  Abdominal exam is reassuring.  Labs are unremarkable.  CT renal stone study negative for kidney stone.  Some leukocytes in the urine, will send urine culture given possible UTI.  Outpatient follow-up recommended for the patient.  Strict return precautions    ____________________________________________   FINAL CLINICAL IMPRESSION(S) / ED DIAGNOSES  Final diagnoses:  Flank pain        Note:  This document was prepared using Dragon voice recognition software and may include unintentional dictation errors.    Vanessia Bokhari,  Herbie Baltimore, MD 08/28/17 2030

## 2017-08-28 NOTE — ED Triage Notes (Signed)
Dysuria x 2 weeks. States began bilateral flank pain 3 days ago.

## 2017-08-30 LAB — URINE CULTURE

## 2017-11-04 IMAGING — CR DG CHEST 2V
1 series · 2 of 2 positions shown · non-contrast
Comparison: 05/17/2009, CT 05/16/2009

CLINICAL DATA: 45-year-old female with a history of chest pain.
Neurofibromatosis. Scoliosis.

EXAM:
CHEST  2 VIEW

[Series 1: dg chest 2 view · 0.14mm/px · 2 of 2 slices shown]
[im 1/2]
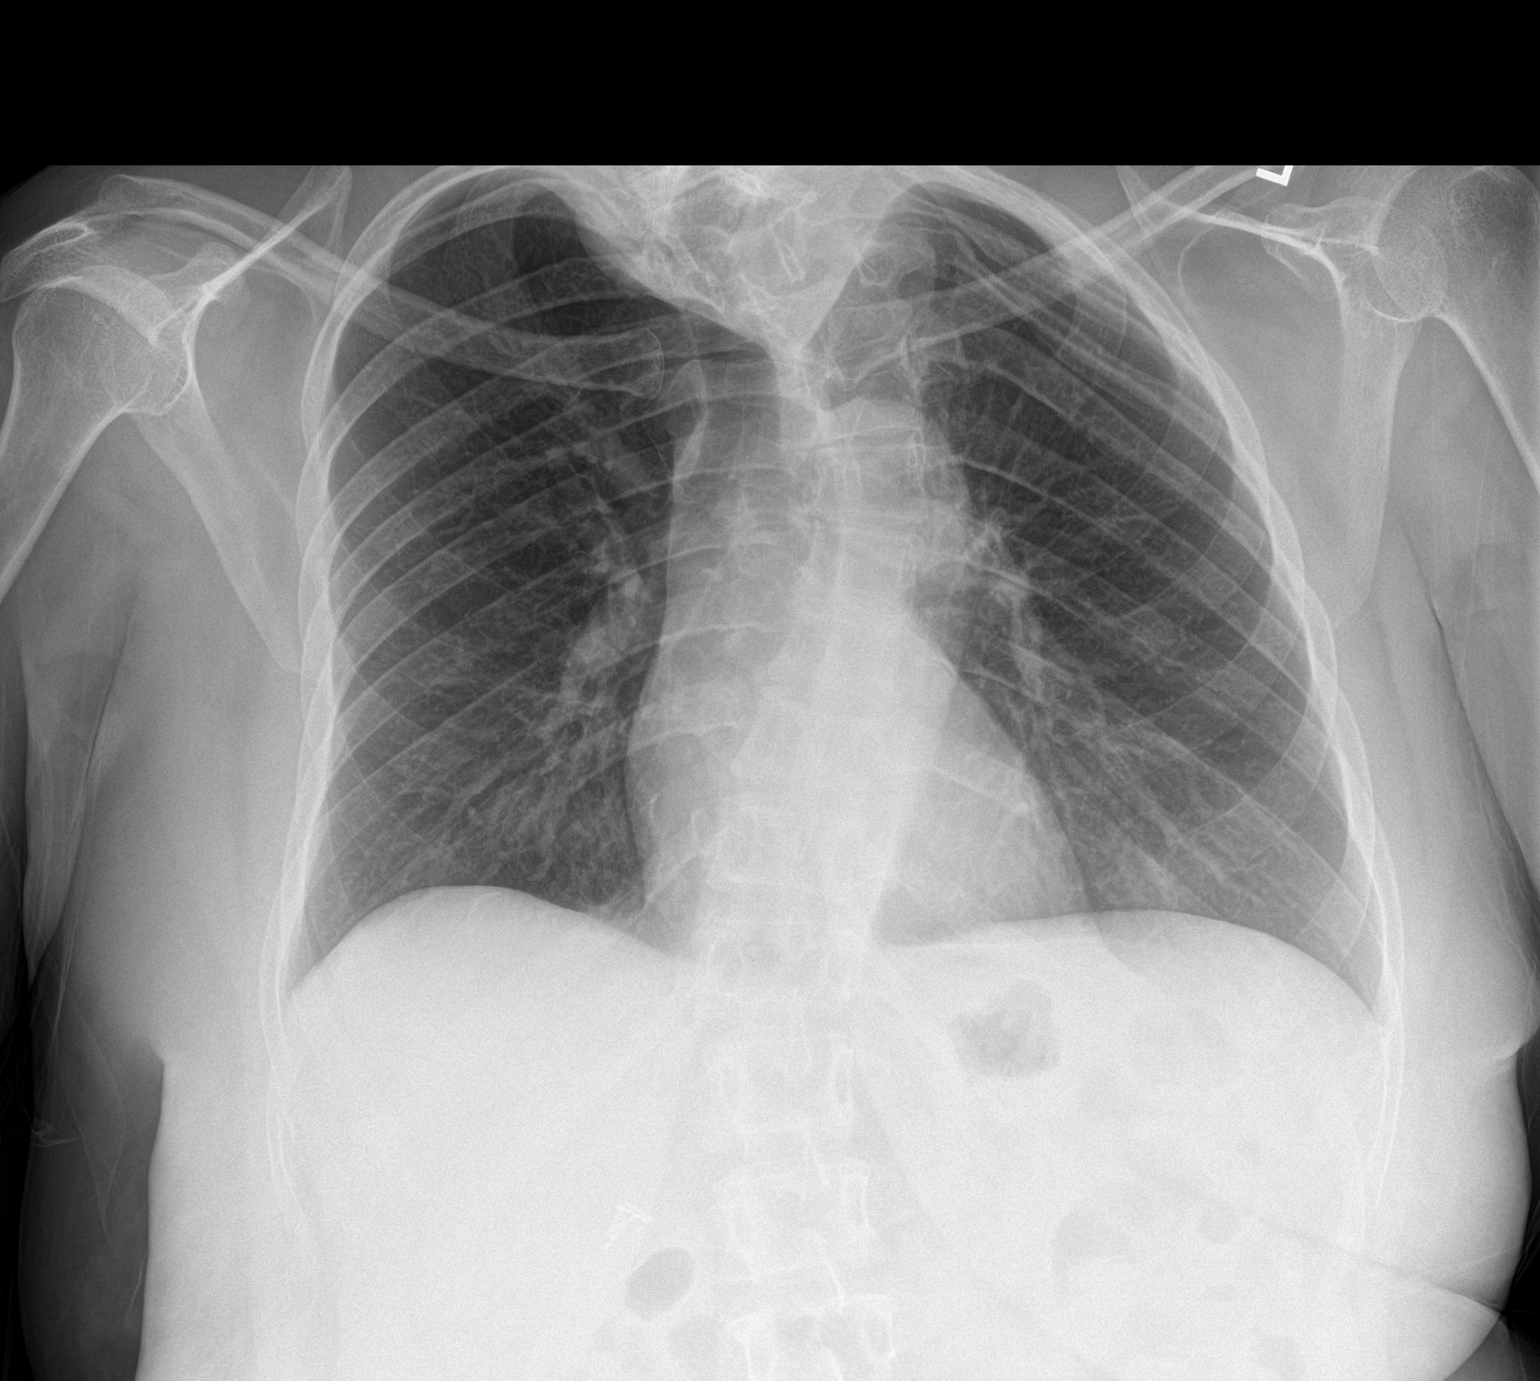
[im 2/2]
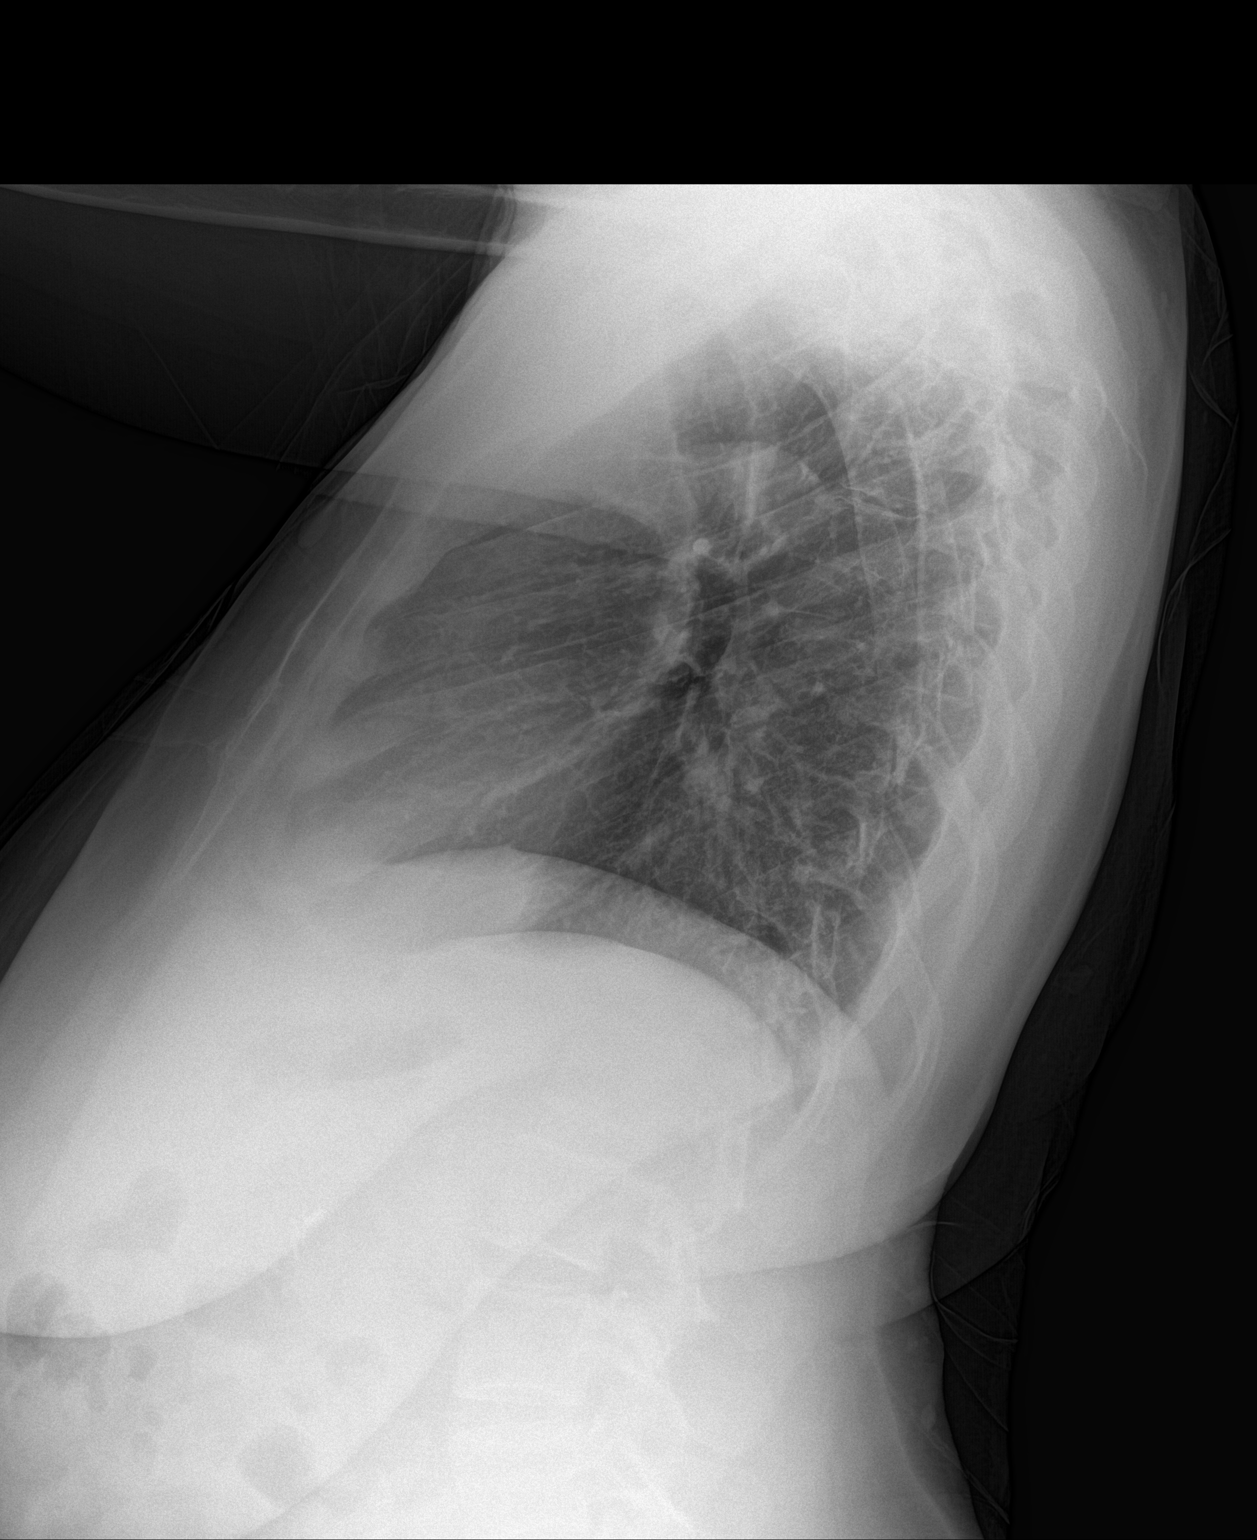

[2 of 2 positions shown; findings below may reference images not displayed]

FINDINGS: Cardiomediastinal silhouette is unchanged. No evidence of central
vascular congestion.

No interlobular septal thickening.

No pneumothorax or pleural effusion.  No confluent airspace disease.

Similar appearance of S-shaped scoliotic curvature of the upper
thoracic spine. Soft tissue mass of the posterior mediastinum better
characterized on prior CT. No significant change in the soft tissue
density in the left paraspinal region.
IMPRESSION: No radiographic evidence of acute cardiopulmonary disease.

Scoliotic curvature of the upper thoracic spine is again noted, with
the associated soft tissue mass of the posterior mediastinum
appearing no different compared to chest x-ray of 05/17/2009.
Characteristics are better characterized on the CT of 05/16/2009.

## 2018-05-12 DIAGNOSIS — R109 Unspecified abdominal pain: Secondary | ICD-10-CM | POA: Diagnosis present

## 2018-05-12 DIAGNOSIS — Z5321 Procedure and treatment not carried out due to patient leaving prior to being seen by health care provider: Secondary | ICD-10-CM | POA: Diagnosis not present

## 2018-05-12 LAB — BASIC METABOLIC PANEL
Anion gap: 9 (ref 5–15)
BUN: 56 mg/dL — AB (ref 6–20)
CHLORIDE: 110 mmol/L (ref 98–111)
CO2: 20 mmol/L — ABNORMAL LOW (ref 22–32)
Calcium: 9.2 mg/dL (ref 8.9–10.3)
Creatinine, Ser: 1.64 mg/dL — ABNORMAL HIGH (ref 0.44–1.00)
GFR calc Af Amer: 43 mL/min — ABNORMAL LOW (ref >60)
GFR calc non Af Amer: 37 mL/min — ABNORMAL LOW (ref >60)
Glucose, Bld: 103 mg/dL — ABNORMAL HIGH (ref 70–99)
POTASSIUM: 3.5 mmol/L (ref 3.5–5.1)
SODIUM: 139 mmol/L (ref 135–145)

## 2018-05-12 LAB — URINALYSIS, COMPLETE (UACMP) WITH MICROSCOPIC
Bilirubin Urine: NEGATIVE
Glucose, UA: 50 mg/dL — AB
Ketones, ur: NEGATIVE mg/dL
Nitrite: NEGATIVE
PH: 5 (ref 5.0–8.0)
Protein, ur: 100 mg/dL — AB
SPECIFIC GRAVITY, URINE: 1.019 (ref 1.005–1.030)

## 2018-05-12 LAB — POCT PREGNANCY, URINE: Preg Test, Ur: NEGATIVE

## 2018-05-12 LAB — CBC
HEMATOCRIT: 40 % (ref 36.0–46.0)
HEMOGLOBIN: 13 g/dL (ref 12.0–15.0)
MCH: 28.9 pg (ref 26.0–34.0)
MCHC: 32.5 g/dL (ref 30.0–36.0)
MCV: 88.9 fL (ref 80.0–100.0)
NRBC: 0 % (ref 0.0–0.2)
Platelets: 333 10*3/uL (ref 150–400)
RBC: 4.5 MIL/uL (ref 3.87–5.11)
RDW: 12.5 % (ref 11.5–15.5)
WBC: 6.8 10*3/uL (ref 4.0–10.5)

## 2018-05-12 NOTE — ED Triage Notes (Signed)
Patient c/o right flank pain, frequent urination with decreased output. Patient reports hx of kidney stones.

## 2018-05-13 ENCOUNTER — Emergency Department
Admission: EM | Admit: 2018-05-13 | Discharge: 2018-05-13 | Disposition: A | Discharge status: Home / Self Care | Payer: Medicare PPO | Attending: Emergency Medicine | Admitting: Emergency Medicine

## 2018-05-24 ENCOUNTER — Other Ambulatory Visit: Payer: Self-pay

## 2018-05-24 ENCOUNTER — Emergency Department
Admission: EM | Admit: 2018-05-24 | Discharge: 2018-05-24 | Disposition: A | Discharge status: Home / Self Care | Payer: Medicare PPO | Attending: Emergency Medicine | Admitting: Emergency Medicine

## 2018-05-24 ENCOUNTER — Encounter: Payer: Self-pay | Admitting: Emergency Medicine

## 2018-05-24 DIAGNOSIS — N390 Urinary tract infection, site not specified: Secondary | ICD-10-CM | POA: Diagnosis not present

## 2018-05-24 DIAGNOSIS — R109 Unspecified abdominal pain: Secondary | ICD-10-CM | POA: Diagnosis present

## 2018-05-24 DIAGNOSIS — Z79899 Other long term (current) drug therapy: Secondary | ICD-10-CM | POA: Diagnosis not present

## 2018-05-24 DIAGNOSIS — I10 Essential (primary) hypertension: Secondary | ICD-10-CM | POA: Insufficient documentation

## 2018-05-24 HISTORY — DX: Disorder of kidney and ureter, unspecified: N28.9

## 2018-05-24 LAB — BASIC METABOLIC PANEL
Anion gap: 7 (ref 5–15)
BUN: 12 mg/dL (ref 6–20)
CALCIUM: 9 mg/dL (ref 8.9–10.3)
CHLORIDE: 104 mmol/L (ref 98–111)
CO2: 28 mmol/L (ref 22–32)
CREATININE: 0.65 mg/dL (ref 0.44–1.00)
GFR calc Af Amer: >60 mL/min (ref >60)
GFR calc non Af Amer: >60 mL/min (ref >60)
Glucose, Bld: 93 mg/dL (ref 70–99)
Potassium: 3.1 mmol/L — ABNORMAL LOW (ref 3.5–5.1)
SODIUM: 139 mmol/L (ref 135–145)

## 2018-05-24 LAB — URINALYSIS, ROUTINE W REFLEX MICROSCOPIC
Bilirubin Urine: NEGATIVE
Glucose, UA: NEGATIVE mg/dL
Hgb urine dipstick: NEGATIVE
Ketones, ur: NEGATIVE mg/dL
NITRITE: NEGATIVE
PROTEIN: NEGATIVE mg/dL
SPECIFIC GRAVITY, URINE: 1.008 (ref 1.005–1.030)
pH: 7 (ref 5.0–8.0)

## 2018-05-24 LAB — CBC
HEMATOCRIT: 34.1 % — AB (ref 36.0–46.0)
Hemoglobin: 11 g/dL — ABNORMAL LOW (ref 12.0–15.0)
MCH: 28.6 pg (ref 26.0–34.0)
MCHC: 32.3 g/dL (ref 30.0–36.0)
MCV: 88.6 fL (ref 80.0–100.0)
PLATELETS: 292 10*3/uL (ref 150–400)
RBC: 3.85 MIL/uL — ABNORMAL LOW (ref 3.87–5.11)
RDW: 12.5 % (ref 11.5–15.5)
WBC: 6.2 10*3/uL (ref 4.0–10.5)
nRBC: 0 % (ref 0.0–0.2)

## 2018-05-24 LAB — HEPATIC FUNCTION PANEL
ALT: 29 U/L (ref 0–44)
AST: 25 U/L (ref 15–41)
Albumin: 3.8 g/dL (ref 3.5–5.0)
Alkaline Phosphatase: 103 U/L (ref 38–126)
Bilirubin, Direct: 0.1 mg/dL (ref 0.0–0.2)
Indirect Bilirubin: 1.1 mg/dL — ABNORMAL HIGH (ref 0.3–0.9)
TOTAL PROTEIN: 7.4 g/dL (ref 6.5–8.1)
Total Bilirubin: 1.2 mg/dL (ref 0.3–1.2)

## 2018-05-24 LAB — LIPASE, BLOOD: Lipase: 28 U/L (ref 11–51)

## 2018-05-24 MED ORDER — KETOROLAC TROMETHAMINE 30 MG/ML IJ SOLN
30.0000 mg | Freq: Once | INTRAMUSCULAR | Status: AC
  Administered 2018-05-24: 30 mg via INTRAVENOUS
  Filled 2018-05-24: qty 1

## 2018-05-24 MED ORDER — CEPHALEXIN 500 MG PO CAPS: 500.0000 mg | ORAL_CAPSULE | Freq: Three times a day (TID) | ORAL | 0 refills | Status: DC

## 2018-05-24 MED ORDER — SODIUM CHLORIDE 0.9 % IV BOLUS
1000.0000 mL | Freq: Once | INTRAVENOUS | Status: AC
  Administered 2018-05-24: 1000 mL via INTRAVENOUS

## 2018-05-24 MED ORDER — SODIUM CHLORIDE 0.9 % IV SOLN
1.0000 g | Freq: Once | INTRAVENOUS | Status: AC
  Administered 2018-05-24: 1 g via INTRAVENOUS
  Filled 2018-05-24: qty 10

## 2018-05-24 NOTE — ED Notes (Signed)
Pt has urine cup for collection but unable to void at this time

## 2018-05-24 NOTE — ED Triage Notes (Signed)
Says being treated for kidney infection.  Says she had urine recheck yesterday at pcp and they said it was worse.  They also did ct scan to check for stones.  Patient says she did not get the result of that.  She says she called today due to nausea, and they told her to come to ED.

## 2018-05-24 NOTE — ED Provider Notes (Signed)
[facility] Emergency Department Provider Note  Time seen: 1:01 PM  I have reviewed the triage vital signs and the nursing notes.   HISTORY  Chief Complaint Flank Pain and Fever    HPI [name] [name] is a 48 y.o. female with a past medical history of hypertension, neurofibromatosis, presents to the emergency department for right flank pain and dysuria.  According to the patient she had been suffering from dysuria for about a week, finished a course of ciprofloxacin prescribed by her PCP return to her PCP yesterday and continued to have a urinalysis showing a urinary tract infection.  Was prescribed a new antibiotic as well as had a CT scan performed per patient.  Did not hear the results of the CT scan yet.  Patient states her pain worsened today and she vomited unable to keep down her new antibiotic so she came to the emergency department for evaluation.  Patient states a history of kidney stones to which this feels somewhat similar.  Patient is status post cholecystectomy x15 years.  States subjective fever/chills at home although denies any measured temperature.   Past Medical History:  Diagnosis Date  . Hypertension    controlled  . 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 Renal disorder   . Scoliosis of thoracic spine    "C" curve, convex to left  . Thyroid disease    controlled    Patient Active Problem List   Diagnosis Date Noted  . Back pain of thoracolumbar region 02/18/2015  . 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 Idiopathic scoliosis 02/18/2015    Past Surgical History:  Procedure Laterality Date  . CESAREAN SECTION    . chamberlain procedure    . THYROIDECTOMY      Prior to Admission medications   Medication Sig Start Date End Date Taking? Authorizing Provider  acetaminophen (TYLENOL) 500 MG tablet Take 500 mg by mouth every 8 (eight) hours as needed. 3 tabs    [provider]  albuterol (PROVENTIL HFA;VENTOLIN HFA) 108 (90 BASE) MCG/ACT inhaler Inhale into the lungs every 6 (six) hours as needed for wheezing or shortness of breath.    [provider]  amitriptyline (ELAVIL) 100 MG tablet Take 100 mg by mouth at bedtime.    [provider]  gabapentin (NEURONTIN) 600 MG tablet Take 600 mg by mouth 3 (three) times daily.    [provider]  metoprolol (LOPRESSOR) 100 MG tablet Take 100 mg by mouth daily.    [provider]  mexiletine (MEXITIL) 150 MG capsule Take 1 capsule (150 mg total) by mouth 3 (three) times daily. 07/15/15   Lance Bosch, MD  oxyCODONE-acetaminophen (ROXICET) 5-325 MG tablet Take 1 tablet by mouth 2 (two) times daily after a meal. Patient not taking: Reported on 10/09/2015 05/20/15   Lance Bosch, MD  oxyCODONE-acetaminophen (ROXICET) 5-325 MG tablet Take 1 tablet by mouth 2 (two) times daily between meals as needed for severe pain. Patient not taking: Reported on 10/09/2015 06/16/15   Lance Bosch, MD  ranitidine (ZANTAC) 150 MG tablet Take 150 mg by mouth daily.    [provider]  simvastatin (ZOCOR) 40 MG tablet Take 40 mg by mouth daily.    [provider]  traMADol (ULTRAM) 50 MG tablet Take 1 tablet (50 mg total) by mouth every 12 (twelve) hours as needed. Reported on 06/16/2015 10/02/15   Lance Bosch, MD    Allergies  Allergen Reactions  . Peanuts [Peanut Oil] Other (See Comments)    Mouth ulcers  . Penicillins Rash    Family History  Problem Relation Age of Onset  . Cancer Mother   . Breast cancer Neg Hx     Social History Social History   Tobacco Use  . Smoking status: Never Smoker  . Smokeless tobacco: Never Used  . Tobacco comment: this patient is a nonsmoker   Substance Use Topics  . Alcohol use: No    Alcohol/week: 0.0 standard drinks  . Drug use: No    Review of Systems Constitutional: Negative for fever. Cardiovascular: Negative for chest  pain. Respiratory: Negative for shortness of breath. Gastrointestinal: Right flank pain.  Nausea vomiting.  Negative for diarrhea. Genitourinary: Negative for urinary compaints Musculoskeletal: Negative for musculoskeletal complaints Skin: Negative for skin complaints  Neurological: Negative for headache All other ROS negative  ____________________________________________   PHYSICAL EXAM:  VITAL SIGNS: ED Triage Vitals  Enc Vitals Group     BP 05/24/18 1223 (!) 121/93     Pulse Rate 05/24/18 1223 88     Resp 05/24/18 1223 16     Temp 05/24/18 1223 98.6 F (37 C)     Temp Source 05/24/18 1223 Oral     SpO2 05/24/18 1223 97 %     Weight 05/24/18 1224 140 lb 14 oz (63.9 kg)     Height 05/24/18 1224 5\' 2"  (1.575 m)     Head Circumference --      Peak Flow --      Pain Score 05/24/18 1223 7     Pain Loc --      Pain Edu? --      Excl. in [place]? --    Constitutional: Alert and oriented. Well appearing and in no distress. Eyes: Normal exam ENT   Head: Normocephalic and atraumatic.   Mouth/Throat: Mucous membranes are moist. Cardiovascular: Normal rate, regular rhythm. No murmur Respiratory: Normal respiratory effort without tachypnea nor retractions. Breath sounds are clear Gastrointestinal: Soft and nontender. No distention.  There is no CVA tenderness. Musculoskeletal: Nontender with normal range of motion in all extremities.  Neurologic:  Normal speech and language. No gross focal neurologic deficits  Skin:  Skin is warm, dry and intact.  Psychiatric: Mood and affect are normal.    INITIAL IMPRESSION / ASSESSMENT AND PLAN / ED COURSE  Pertinent labs & imaging results that were available during my care of the patient were reviewed by me and considered in my medical decision making (see chart for details).  Patient presents emergency department for right flank pain nausea vomiting and continued dysuria.  Patient has a history of kidney stones.  Completed a course of  ciprofloxacin but states yesterday she went back to her PCP and her urinalysis continued to show urinary tract infection.  Patient states she had a CT performed yesterday by her PCP, there is no CT and care everywhere for [place] to review we will attempt to obtain the CT report from the PCP.  Patient's blood work is largely within normal limits, urinalysis pending.  I have added on hepatic function panel and lipase.  Differential this time would include urinary tract infection, pyelonephritis, ureterolithiasis, less likely appendicitis.  Reassuringly patient has a completely benign abdominal exam.  We will dose Toradol, IV fluids and continue to closely monitor.  I was able to speak to the patient's nurse practitioner caring for the patient.  They initially put the patient on ciprofloxacin, I did  not respond to ciprofloxacin and they change the patient to Levaquin yesterday.  She was able to read the CT report performed yesterday which essentially was negative.  No stones.  We will check labs, urinalysis and continue to closely monitor.  Patient's labs are reassuring.  Urinalysis is equivocal.  Urine culture was sent.  Patient will be discharged home with PCP follow-up.  ____________________________________________   FINAL CLINICAL IMPRESSION(S) / ED DIAGNOSES  Right flank pain   Harvest Dark, MD 05/28/18 1911

## 2018-05-25 LAB — URINE CULTURE: CULTURE: NO GROWTH

## 2018-11-15 ENCOUNTER — Other Ambulatory Visit: Payer: Self-pay | Admitting: Internal Medicine

## 2018-11-15 DIAGNOSIS — Z1231 Encounter for screening mammogram for malignant neoplasm of breast: Secondary | ICD-10-CM

## 2018-11-26 ENCOUNTER — Ambulatory Visit: Payer: Self-pay | Admitting: Urology

## 2018-12-19 ENCOUNTER — Ambulatory Visit: Payer: Self-pay | Admitting: Urology

## 2018-12-26 ENCOUNTER — Ambulatory Visit: Payer: Self-pay | Admitting: Urology

## 2019-01-03 ENCOUNTER — Inpatient Hospital Stay: Admission: RE | Admit: 2019-01-03 | Payer: Medicare PPO | Source: Ambulatory Visit

## 2019-01-17 ENCOUNTER — Encounter: Payer: Self-pay | Admitting: Urology

## 2019-01-17 ENCOUNTER — Ambulatory Visit (INDEPENDENT_AMBULATORY_CARE_PROVIDER_SITE_OTHER): Payer: Medicare PPO | Admitting: Urology

## 2019-01-17 ENCOUNTER — Other Ambulatory Visit: Payer: Self-pay

## 2019-01-17 VITALS — BP 137/91 | HR 139 | Ht 61.0 in | Wt 150.0 lb

## 2019-01-17 DIAGNOSIS — N200 Calculus of kidney: Secondary | ICD-10-CM | POA: Diagnosis not present

## 2019-01-17 DIAGNOSIS — N390 Urinary tract infection, site not specified: Secondary | ICD-10-CM | POA: Diagnosis not present

## 2019-01-17 LAB — URINALYSIS, COMPLETE
Bilirubin, UA: NEGATIVE
Glucose, UA: NEGATIVE
Nitrite, UA: NEGATIVE
Specific Gravity, UA: >1.03 — ABNORMAL HIGH (ref 1.005–1.030)
Urobilinogen, Ur: 1 mg/dL (ref 0.2–1.0)
pH, UA: 5.5 (ref 5.0–7.5)

## 2019-01-17 LAB — MICROSCOPIC EXAMINATION

## 2019-01-17 MED ORDER — SULFAMETHOXAZOLE-TRIMETHOPRIM 800-160 MG PO TABS: 1.0000 | ORAL_TABLET | Freq: Two times a day (BID) | ORAL | 0 refills | Status: DC

## 2019-01-17 NOTE — Progress Notes (Signed)
01/17/19 5:54 PM   [name] [name] 12-04-1970 [id number]  Referring provider: Perrin Maltese, MD [place],  [place] [address]  CC: Recurrent nephrolithiasis, UTI  HPI: I saw [name] in urology clinic in consultation from Dr. Yancey Flemings for recurrent nephrolithiasis and UTI.  Patient is a very poor historian, and much of the history is obtained from the chart.  She reports 2 weeks of urinary symptoms of dysuria, urgency, and frequency similar to her usual UTI symptoms, as well as some mild right-sided flank pain.  She denies any pain that radiates to the groin or pain consistent with renal colic.  She reports she is passed about 5 stone spontaneously, but has never required surgery for stones.  She denies any fevers, gross hematuria, chest pain, or shortness of breath.  There are no aggravating or alleviating factors.  Severity is moderate.  Last imaging was May 2019 with CT that showed no nephrolithiasis or hydronephrosis.  Urinalysis today concerning for infection with moderate bacteria, 11-30 WBCs, nitrite negative, 0 RBCs.  PMH: Past Medical History:  Diagnosis Date  . Hypertension    controlled  . 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 Renal disorder   . Scoliosis of thoracic spine    "C" curve, convex to left  . Thyroid disease    controlled    Surgical History: Past Surgical History:  Procedure Laterality Date  . CESAREAN SECTION    . chamberlain procedure    . THYROIDECTOMY      Allergies:  Allergies  Allergen Reactions  . Peanuts [Peanut Oil] Hives and Other (See Comments)    Mouth ulcers  . Penicillins Hives and Rash    Family History: Family History  Problem Relation Age of Onset  . Cancer Mother   . Breast cancer Neg Hx     Social History:  reports that she has never smoked. She has never used smokeless tobacco. She reports that she does not drink alcohol or use drugs.  ROS: Please see flowsheet from today's  date for complete review of systems.  Physical Exam: BP (!) 137/91   Pulse (!) 139   Ht 5\' 1"  (1.549 m)   Wt 150 lb (68 kg)   BMI 28.34 kg/m    Constitutional:  Alert and oriented, No acute distress. Cardiovascular: No clubbing, cyanosis, or edema. Respiratory: Normal respiratory effort, no increased work of breathing. GI: Abdomen is soft, nontender, nondistended, no abdominal masses GU: No CVA tenderness Lymph: No cervical or inguinal lymphadenopathy. Skin: No rashes, bruises or suspicious lesions. Neurologic: Grossly intact, no focal deficits, moving all 4 extremities. Psychiatric: Normal mood and affect.  Assessment & Plan:   In summary, the patient is a 48 year old female with reported history of nephrolithiasis, all spontaneously passed, as well as recurrent UTIs.  Her symptoms today are most consistent with a UTI, and urinalysis is worrisome for infection.  She does not have any microscopic hematuria or history strongly worrisome for nephrolithiasis.  We discussed the evaluation and treatment of patients with recurrent UTIs at length.  We specifically discussed the differences between asymptomatic bacteriuria and true urinary tract infection.  We discussed the AUA definition of recurrent UTI of at least 2 culture proven symptomatic acute cystitis episodes in a 53-month period, or 3 within a 1 year period.  We discussed the importance of culture directed antibiotic treatment, and antibiotic stewardship.  First-line therapy includes nitrofurantoin(5 days), Bactrim(3 days), or fosfomycin(3 g single dose).  Possible etiologies of recurrent infection include periurethral tissue atrophy in postmenopausal woman, constipation, sexual activity, incomplete emptying, anatomic abnormalities, and even genetic predisposition.  Finally, we discussed the role of perineal hygiene, timed voiding, adequate hydration, topical vaginal estrogen, cranberry prophylaxis, and low-dose antibiotic prophylaxis.   -Urine sent for culture, Bactrim DS twice daily x5 days -Call if persistent flank pain after UTI treatment, consider further imaging to rule out nephrolithiasis at that time -RTC 6 months with Sabin, MD  [place] [address], [place], [place] [address] [phone]

## 2019-01-17 NOTE — Patient Instructions (Signed)
Urinary Tract Infection, Adult A urinary tract infection (UTI) is an infection of any part of the urinary tract. The urinary tract includes:  The kidneys.  The ureters.  The bladder.  The urethra. These organs make, store, and get rid of pee (urine) in the body. What are the causes? This is caused by germs (bacteria) in your genital area. These germs grow and cause swelling (inflammation) of your urinary tract. What increases the risk? You are more likely to develop this condition if:  You have a small, thin tube (catheter) to drain pee.  You cannot control when you pee or poop (incontinence).  You are female, and: ? You use these methods to prevent pregnancy: ? A medicine that kills sperm (spermicide). ? A device that blocks sperm (diaphragm). ? You have low levels of a female hormone (estrogen). ? You are pregnant.  You have genes that add to your risk.  You are sexually active.  You take antibiotic medicines.  You have trouble peeing because of: ? A prostate that is bigger than normal, if you are female. ? A blockage in the part of your body that drains pee from the bladder (urethra). ? A kidney stone. ? A nerve condition that affects your bladder (neurogenic bladder). ? Not getting enough to drink. ? Not peeing often enough.  You have other conditions, such as: ? Diabetes. ? A weak disease-fighting system (immune system). ? Sickle cell disease. ? Gout. ? Injury of the spine. What are the signs or symptoms? Symptoms of this condition include:  Needing to pee right away (urgently).  Peeing often.  Peeing small amounts often.  Pain or burning when peeing.  Blood in the pee.  Pee that smells bad or not like normal.  Trouble peeing.  Pee that is cloudy.  Fluid coming from the vagina, if you are female.  Pain in the belly or lower back. Other symptoms include:  Throwing up (vomiting).  No urge to eat.  Feeling mixed up (confused).  Being tired  and grouchy (irritable).  A fever.  Watery poop (diarrhea). How is this treated? This condition may be treated with:  Antibiotic medicine.  Other medicines.  Drinking enough water. Follow these instructions at home:  Medicines  Take over-the-counter and prescription medicines only as told by your doctor.  If you were prescribed an antibiotic medicine, take it as told by your doctor. Do not stop taking it even if you start to feel better. General instructions  Make sure you: ? Pee until your bladder is empty. ? Do not hold pee for a long time. ? Empty your bladder after sex. ? Wipe from front to back after pooping if you are a female. Use each tissue one time when you wipe.  Drink enough fluid to keep your pee pale yellow.  Keep all follow-up visits as told by your doctor. This is important. Contact a doctor if:  You do not get better after 1-2 days.  Your symptoms go away and then come back. Get help right away if:  You have very bad back pain.  You have very bad pain in your lower belly.  You have a fever.  You are sick to your stomach (nauseous).  You are throwing up. Summary  A urinary tract infection (UTI) is an infection of any part of the urinary tract.  This condition is caused by germs in your genital area.  There are many risk factors for a UTI. These include having a small, thin   tube to drain pee and not being able to control when you pee or poop.  Treatment includes antibiotic medicines for germs.  Drink enough fluid to keep your pee pale yellow. This information is not intended to replace advice given to you by your health care provider. Make sure you discuss any questions you have with your health care provider. Document Released: 09/14/2007 Document Revised: 03/15/2018 Document Reviewed: 10/05/2017 Elsevier Patient Education  2020 [employer].   Dietary Guidelines to Help Prevent Kidney Stones Kidney stones are deposits of minerals and  salts that form inside your kidneys. Your risk of developing kidney stones may be greater depending on your diet, your lifestyle, the medicines you take, and whether you have certain medical conditions. Most people can reduce their chances of developing kidney stones by following the instructions below. Depending on your overall health and the type of kidney stones you tend to develop, your dietitian may give you more specific instructions. What are tips for following this plan? Reading food labels  Choose foods with "no salt added" or "low-salt" labels. Limit your sodium intake to less than 1500 mg per day.  Choose foods with calcium for each meal and snack. Try to eat about 300 mg of calcium at each meal. Foods that contain 200-500 mg of calcium per serving include: ? 8 oz (237 ml) of milk, fortified nondairy milk, and fortified fruit juice. ? 8 oz (237 ml) of kefir, yogurt, and soy yogurt. ? 4 oz (118 ml) of tofu. ? 1 oz of cheese. ? 1 cup (300 g) of dried figs. ? 1 cup (91 g) of cooked broccoli. ? 1-3 oz can of sardines or mackerel.  Most people need 1000 to 1500 mg of calcium each day. Talk to your dietitian about how much calcium is recommended for you. Shopping  Buy plenty of fresh fruits and vegetables. Most people do not need to avoid fruits and vegetables, even if they contain nutrients that may contribute to kidney stones.  When shopping for convenience foods, choose: ? Whole pieces of fruit. ? Premade salads with dressing on the side. ? Low-fat fruit and yogurt smoothies.  Avoid buying frozen meals or prepared deli foods.  Look for foods with live cultures, such as yogurt and kefir. Cooking  Do not add salt to food when cooking. Place a salt shaker on the table and allow each person to add his or her own salt to taste.  Use vegetable protein, such as beans, textured vegetable protein (TVP), or tofu instead of meat in pasta, casseroles, and soups. Meal planning   Eat  less salt, if told by your dietitian. To do this: ? Avoid eating processed or premade food. ? Avoid eating fast food.  Eat less animal protein, including cheese, meat, poultry, or fish, if told by your dietitian. To do this: ? Limit the number of times you have meat, poultry, fish, or cheese each week. Eat a diet free of meat at least 2 days a week. ? Eat only one serving each day of meat, poultry, fish, or seafood. ? When you prepare animal protein, cut pieces into small portion sizes. For most meat and fish, one serving is about the size of one deck of cards.  Eat at least 5 servings of fresh fruits and vegetables each day. To do this: ? Keep fruits and vegetables on hand for snacks. ? Eat 1 piece of fruit or a handful of berries with breakfast. ? Have a salad and fruit at lunch. ?  Have two kinds of vegetables at dinner.  Limit foods that are high in a substance called oxalate. These include: ? Spinach. ? Rhubarb. ? Beets. ? Potato chips and french fries. ? Nuts.  If you regularly take a diuretic medicine, make sure to eat at least 1-2 fruits or vegetables high in potassium each day. These include: ? Avocado. ? Banana. ? Orange, prune, carrot, or tomato juice. ? Baked potato. ? Cabbage. ? Beans and split peas. General instructions   Drink enough fluid to keep your urine clear or pale yellow. This is the most important thing you can do.  Talk to your health care provider and dietitian about taking daily supplements. Depending on your health and the cause of your kidney stones, you may be advised: ? Not to take supplements with vitamin C. ? To take a calcium supplement. ? To take a daily probiotic supplement. ? To take other supplements such as magnesium, fish oil, or vitamin B6.  Take all medicines and supplements as told by your health care provider.  Limit alcohol intake to no more than 1 drink a day for nonpregnant women and 2 drinks a day for men. One drink equals 12 oz  of beer, 5 oz of wine, or 1 oz of hard liquor.  Lose weight if told by your health care provider. Work with your dietitian to find strategies and an eating plan that works best for you. What foods are not recommended? Limit your intake of the following foods, or as told by your dietitian. Talk to your dietitian about specific foods you should avoid based on the type of kidney stones and your overall health. Grains Breads. Bagels. Rolls. Baked goods. Salted crackers. Cereal. Pasta. Vegetables Spinach. Rhubarb. Beets. Canned vegetables. Angie Fava. Olives. Meats and other protein foods Nuts. Nut butters. Large portions of meat, poultry, or fish. Salted or cured meats. Deli meats. Hot dogs. Sausages. Dairy Cheese. Beverages Regular soft drinks. Regular vegetable juice. Seasonings and other foods Seasoning blends with salt. Salad dressings. Canned soups. Soy sauce. Ketchup. Barbecue sauce. Canned pasta sauce. Casseroles. Pizza. Lasagna. Frozen meals. Potato chips. [place] fries. Summary  You can reduce your risk of kidney stones by making changes to your diet.  The most important thing you can do is drink enough fluid. You should drink enough fluid to keep your urine clear or pale yellow.  Ask your health care provider or dietitian how much protein from animal sources you should eat each day, and also how much salt and calcium you should have each day. This information is not intended to replace advice given to you by your health care provider. Make sure you discuss any questions you have with your health care provider. Document Released: 07/23/2010 Document Revised: 07/18/2018 Document Reviewed: 03/08/2016 Elsevier Patient Education  2020 [employer].

## 2019-01-20 LAB — CULTURE, URINE COMPREHENSIVE

## 2019-02-05 DIAGNOSIS — D3610 Benign neoplasm of peripheral nerves and autonomic nervous system, unspecified: Secondary | ICD-10-CM | POA: Insufficient documentation

## 2019-03-01 ENCOUNTER — Encounter: Payer: Self-pay | Admitting: Family Medicine

## 2019-04-01 ENCOUNTER — Encounter: Payer: Self-pay | Admitting: Student in an Organized Health Care Education/Training Program

## 2019-04-01 NOTE — Progress Notes (Signed)
Patient's Name: [name] [name]  MRN: [id number]  Referring Provider: Anabel Bene, MD  DOB: February 02, 1971  PCP: Perrin Maltese, MD  DOS: 04/02/2019  Note by: Gillis Santa, MD  Service setting: Ambulatory outpatient  Specialty: Interventional Pain Management  Location: [facility] Virtual Visit  Visit type: Initial Patient Evaluation  Patient type: New Patient   Virtual Encounter - Pain Management PROVIDER NOTE: Information contained herein reflects review and annotations entered in association with encounter. Patient information is provided elsewhere in the medical record. Interpretation of information and data should be left to medically trained personnel. Document created using STT technology, any transcriptional errors that may result from process are unintentional.  Contact & Pharmacy Preferred: [phone] Home: [phone] (home) Mobile: [phone] (mobile) E-mail: [email or web address]  [facility] [place], [place] - [address] [place] [address] Phone: [phone] Fax: [phone]  [facility] [id number] [address] Phone: [phone] Fax: [phone]   Pre-screening note:  Our staff contacted [name] [name] and offered her an "in person", "face-to-face" appointment versus a telephone encounter. She indicated preferring the telephone encounter, at this time.  Primary Reason(s) for Visit: Tele-Encounter for initial evaluation of one or more chronic problems (new to examiner) potentially causing chronic pain, and posing a threat to normal musculoskeletal function. (Level of risk: High) CC: Chest Pain  I contacted [name] [name] on 04/02/2019 via video conference.      I clearly identified myself as Gillis Santa, MD. I verified that I was speaking with the correct person using two identifiers (Name: [name] [name], and  date of birth: 21-Mar-1971).  Advanced Informed Consent I sought verbal advanced consent from [name] [name] for virtual visit interactions. I informed [name] [name] of possible security and privacy concerns, risks, and limitations associated with providing "not-in-person" medical evaluation and management services. I also informed [name] [name] of the availability of "in-person" appointments. Finally, I informed her that there would be a charge for the virtual visit and that she could be  personally, fully or partially, financially responsible for it. [name] [name] expressed understanding and agreed to proceed.   HPI  [name] [name] is a 48 y.o. year old, female patient, contacted today for an initial evaluation of her chronic pain. 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  Pain Assessment: Location: Left Chest Radiating: radiates around to back thoracic area Onset: More than a month ago Duration: Chronic pain Quality: Aching, Dull, Constant, Sharp, Stabbing Severity: 5 /10 (subjective, self-reported pain score)  Effect on ADL: "I cant stand for long" Timing: Constant Modifying factors: lying down, medications  Onset and Duration: Gradual and Present longer than 3 months Cause of pain: Neurofibromatosis Type I Severity: Getting worse, NAS-11 at its worse: 10/10, NAS-11 at its best: 3/10, NAS-11 now: 5/10 and NAS-11 on the average: 5/10 Timing: Morning and Not influenced by the time of the day Aggravating Factors: Prolonged standing and Walking Alleviating Factors: Lying down and Medications Associated Problems: Pain that wakes patient up and Pain that does not allow patient to sleep Quality of Pain: Aching, Constant, Sharp, Stabbing and Throbbing Previous Examinations or Tests: MRI  scan and X-rays, Lidocaine infusion Previous  Treatments: Physical Therapy  Patient is a very pleasant 48 year old female with a history of neurofibromatosis type I who presents with a chief complaint of chest pain that radiates to her left and right side and into her thoracic spine that has worsened over the last 10 years.  This pain is worse with extended walking and standing.  The pain also radiates to her upper back and underneath her arm.  Lying down also makes it worse.  She has seen Dr. Laverle Patter here at the pain clinic in the past for lidocaine infusions which were not helpful.  She is managed on baclofen, nortriptyline, gabapentin as below per neurology.  The reason for referral is that at times she does have severe pain flares that are very debilitating.  She is interested in having a as needed opioid analgesic that she can utilize when she does have these pain flares so that she does not need to be in pain or go to the ED or urgent care.  This is reasonable.  I informed the patient of steps required to become established at our clinic for chronic opioid therapy which include baseline urine toxicology screen and psychiatric evaluation.  I will place order for urine toxicology screen as well as psych evaluation.  Once patient has completed her psych eval, I recommend that she contact our clinic at which point we can discuss as needed opioid therapy for her severe/breakthrough pain.   Historic Controlled Substance Pharmacotherapy Review  Historical Background Evaluation: [place] PMP: PDMP not reviewed this encounter. Six (6) year initial data search conducted.             [place] Department of public safety, offender search: Editor, commissioning Information) Non-contributory Risk Assessment Profile: Aberrant behavior: None observed or detected today Risk factors for fatal opioid overdose: age 48-22 years old and None identified today Fatal overdose hazard ratio (HR): Calculation deferred Non-fatal overdose hazard ratio  (HR): Calculation deferred Risk of opioid abuse or dependence: 0.7-3.0% with doses ? 36 MME/day and 6.1-26% with doses ? 120 MME/day. Substance use disorder (SUD) risk level: Pending results of Medical Psychology Evaluation for SUD Personal History of Substance Abuse (SUD-Substance use disorder):  Alcohol: Negative  Illegal Drugs: Negative  Rx Drugs: Negative  ORT Risk Level calculation: Low Risk Opioid Risk Tool - 04/01/19 1250      Family History of Substance Abuse   Alcohol  Negative    Illegal Drugs  Negative    Rx Drugs  Negative      Personal History of Substance Abuse   Alcohol  Negative    Illegal Drugs  Negative    Rx Drugs  Negative      Age   Age between 6-45 years   No      History of Preadolescent Sexual Abuse   History of Preadolescent Sexual Abuse  Negative or Female      Psychological Disease   Psychological Disease  Negative    Depression  Negative      Total Score   Opioid Risk Tool Scoring  0    Opioid Risk Interpretation  Low Risk      ORT Scoring interpretation table:  Score <3 = Low Risk for SUD  Score between 4-7 = Moderate Risk for SUD  Score >8 = High Risk for Opioid Abuse   Pharmacologic Plan: As per protocol, I have not taken over any controlled substance management, pending the results of ordered tests and/or consults.  Initial impression: Pending review of available data and ordered tests.  Meds   Current Outpatient Medications:  .  acetaminophen (TYLENOL) 500 MG tablet, Take 500 mg by mouth every 8 (eight) hours as needed. 3 tabs, Disp: , Rfl:  .  albuterol (PROVENTIL HFA;VENTOLIN HFA) 108 (90 BASE) MCG/ACT inhaler, Inhale into the lungs every 6 (six) hours as needed for wheezing or shortness of breath., Disp: , Rfl:  .  baclofen (LIORESAL) 10 MG tablet, Take 10 mg by mouth 2 (two) times daily., Disp: , Rfl:  .  gabapentin (NEURONTIN) 600 MG tablet, Take 600 mg by mouth 4 (four) times daily. , Disp: , Rfl:  .  meloxicam (MOBIC)  15 MG tablet, Take 15 mg by mouth daily., Disp: , Rfl:  .  metoprolol (LOPRESSOR) 100 MG tablet, Take 100 mg by mouth daily., Disp: , Rfl:  .  nortriptyline (PAMELOR) 10 MG capsule, Take 10 mg by mouth 2 (two) times daily., Disp: , Rfl:  .  pantoprazole (PROTONIX) 40 MG tablet, Take 40 mg by mouth daily., Disp: , Rfl:  .  SUMAtriptan (IMITREX) 100 MG tablet, Take 100 mg by mouth every 2 (two) hours as needed for migraine. May repeat in 2 hours if headache persists or recurs., Disp: , Rfl:  .  amitriptyline (ELAVIL) 10 MG tablet, Take 10 mg by mouth at bedtime., Disp: , Rfl:  .  amitriptyline (ELAVIL) 100 MG tablet, Take 100 mg by mouth at bedtime., Disp: , Rfl:  .  cephALEXin (KEFLEX) 500 MG capsule, Take 1 capsule (500 mg total) by mouth 3 (three) times daily. (Patient not taking: Reported on 04/01/2019), Disp: 30 capsule, Rfl: 0 .  ranitidine (ZANTAC) 150 MG tablet, Take 150 mg by mouth daily., Disp: , Rfl:  .  simvastatin (ZOCOR) 40 MG tablet, Take 40 mg by mouth daily., Disp: , Rfl:  .  sulfamethoxazole-trimethoprim (BACTRIM DS) 800-160 MG tablet, Take 1 tablet by mouth every 12 (twelve) hours. (Patient not taking: Reported on 04/01/2019), Disp: 10 tablet, Rfl: 0  ROS  Cardiovascular: Abnormal heart rhythm and High blood pressure, tachycardia Pulmonary or Respiratory: Wheezing and difficulty taking a deep full breath (Asthma) Neurological: Curved spine Review of Past Neurological Studies:  Results for orders placed or performed during the hospital encounter of 12/23/16  CT Head Wo Contrast   Narrative   CLINICAL DATA:  Headache. Focal neural scratched at focal neuro deficits.  EXAM: CT HEAD WITHOUT CONTRAST  TECHNIQUE: Contiguous axial images were obtained from the base of the skull through the vertex without intravenous contrast.  COMPARISON:  MRI 08/24/2016  FINDINGS: Brain: No acute intracranial abnormality. Specifically, no hemorrhage, hydrocephalus, mass lesion, acute  infarction, or significant intracranial injury.  Vascular: No hyperdense vessel or unexpected calcification.  Skull: No acute calvarial abnormality.  Sinuses/Orbits: Visualized paranasal sinuses and mastoids clear. Orbital soft tissues unremarkable.  Other: None  IMPRESSION: No acute intracranial abnormality.   Electronically Signed   By: Rolm Baptise M.D.   On: 12/23/2016 11:10    Psychological-Psychiatric: No reported psychological or psychiatric signs or symptoms such as difficulty sleeping, anxiety, depression, delusions or hallucinations (schizophrenial), mood swings (bipolar disorders) or suicidal ideations or attempts Gastrointestinal: Reflux or heatburn Genitourinary: Kidney disease Hematological: No reported hematological signs or symptoms such as prolonged bleeding, low or poor functioning platelets, bruising or bleeding easily, hereditary bleeding problems, low energy levels due to low hemoglobin or being anemic Endocrine: High thyroid, partial thyroidectomy Rheumatologic: No reported rheumatological signs and symptoms such as  fatigue, joint pain, tenderness, swelling, redness, heat, stiffness, decreased range of motion, with or without associated rash Musculoskeletal: Negative for myasthenia gravis, muscular dystrophy, multiple sclerosis or malignant hyperthermia Work History:did not ask  Allergies  [name] [name] is allergic to peanuts [peanut oil] and penicillins.  Laboratory Chemistry Profile   Screening Lab Results  Component Value Date   PREGTESTUR NEGATIVE 05/12/2018    Renal Lab Results  Component Value Date   BUN 12 05/24/2018   CREATININE 0.65 05/24/2018   GFRAA >60 05/24/2018   GFRNONAA >60 05/24/2018                             Hepatic Lab Results  Component Value Date   AST 25 05/24/2018   ALT 29 05/24/2018   ALBUMIN 3.8 05/24/2018   ALKPHOS 103 05/24/2018   LIPASE 28 05/24/2018                        Electrolytes Lab Results   Component Value Date   NA 139 05/24/2018   K 3.1 (L) 05/24/2018   CL 104 05/24/2018   CALCIUM 9.0 05/24/2018                        Coagulation Lab Results  Component Value Date   PLT 292 05/24/2018                        Cardiovascular Lab Results  Component Value Date   TROPONINI <0.03 12/23/2016   HGB 11.0 (L) 05/24/2018   HCT 34.1 (L) 05/24/2018                         ID Lab Results  Component Value Date   PREGTESTUR NEGATIVE 05/12/2018   MICROTEXT  01/29/2012       COMMENT                   NO GROWTH AEROBICALLY/ANAEROBICALLY IN 5 DAYS   ANTIBIOTIC                                                       MICROTEXT  01/29/2012       COMMENT                   NO GROWTH AEROBICALLY/ANAEROBICALLY IN 5 DAYS   COMMENT                   -   ANTIBIOTIC                                                        Cancer No results found for: CEA, CA125, LABCA2                      Endocrine No results found for: TSH, FREET4, TESTOFREE, TESTOSTERONE, SHBG, ESTRADIOL, ESTRADIOLPCT, ESTRADIOLFRE, LABPREG, ACTH                      Note: Lab results reviewed.  [place]  Drug: [name] [name]  reports no history of drug use. Alcohol:  reports no history of alcohol use. Tobacco:  reports that she has never smoked. She has never used smokeless tobacco. Medical:  has a past medical history of Hypertension, Neurofibromatosis ([place]), Renal disorder, Scoliosis of thoracic spine, and Thyroid disease. Family: family history includes Cancer in her mother.  Past Surgical History:  Procedure Laterality Date  . CESAREAN SECTION    . chamberlain procedure    . THYROIDECTOMY     Active Ambulatory Problems    Diagnosis Date Noted  . Back pain of thoracolumbar region 02/18/2015  . 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 Idiopathic scoliosis 02/18/2015  . Chest wall pain, chronic 12/25/2013  . Thoracic spine pain 07/26/2013  . Neurofibroma  02/05/2019  . Neuropathic pain 07/26/2013  . Neurofibromatosis, type 1 ([place]) 07/26/2013   Resolved Ambulatory Problems    Diagnosis Date Noted  . No Resolved Ambulatory Problems   Past Medical History:  Diagnosis Date  . Hypertension   . Neurofibromatosis ([place])   . Renal disorder   . Scoliosis of thoracic spine   . 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 Diagnoses of Other idiopathic scoliosis, lumbosacral region, Back pain of thoracolumbar region, Chest wall pain, chronic, Thoracic spine pain, Neuropathic pain, and Chronic pain syndrome were also pertinent to this visit.  Visit Diagnosis (New problems to examiner): 1. 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 Other idiopathic scoliosis, lumbosacral region   3. Back pain of thoracolumbar region   4. Chest wall pain, chronic   5. Thoracic spine pain   6. Neuropathic pain   7. Chronic pain syndrome    Plan of Care (Initial workup plan)  Note: Ms. Dadisman was reminded that as per protocol, today's visit has been an evaluation only. We have not taken over the patient's controlled substance management.   Lab Orders     UDS (Comprehensive-24) ([facility]) ([facility]) (New Pt.)  Referral Orders     SUD Evaluation (Med.Psych.)  Continue gabapentin and nortriptyline as prescribed by neurology.  Provider-requested follow-up: Return for Patient will call for second visit after psych evaluation.  Future Appointments  Date Time Provider [place]  07/17/2019 10:00 AM Billey Co, MD [facility] None    Total duration of non-face-to-face encounter: 30 minutes.  Primary Care Physician: Perrin Maltese, MD Location: [facility] Note by: Gillis Santa, MD Date: 04/02/2019; Time: 9:33 AM  Note: This dictation was prepared with Dragon  dictation. Any transcriptional errors that may result from this process are unintentional.

## 2019-04-02 ENCOUNTER — Other Ambulatory Visit: Payer: Self-pay

## 2019-04-02 ENCOUNTER — Encounter: Payer: Self-pay | Admitting: Student in an Organized Health Care Education/Training Program

## 2019-04-02 ENCOUNTER — Ambulatory Visit
Payer: Medicare PPO | Attending: Student in an Organized Health Care Education/Training Program | Admitting: Student in an Organized Health Care Education/Training Program

## 2019-04-02 VITALS — Ht 61.0 in | Wt 148.0 lb

## 2019-04-02 DIAGNOSIS — G894 Chronic pain syndrome: Secondary | ICD-10-CM

## 2019-04-02 DIAGNOSIS — Q8501 Neurofibromatosis, type 1: Secondary | ICD-10-CM

## 2019-04-02 DIAGNOSIS — M792 Neuralgia and neuritis, unspecified: Secondary | ICD-10-CM

## 2019-04-02 DIAGNOSIS — M4127 Other idiopathic scoliosis, lumbosacral region: Secondary | ICD-10-CM | POA: Diagnosis not present

## 2019-04-02 DIAGNOSIS — R0789 Other chest pain: Secondary | ICD-10-CM | POA: Diagnosis not present

## 2019-04-02 DIAGNOSIS — G8929 Other chronic pain: Secondary | ICD-10-CM

## 2019-04-02 DIAGNOSIS — M545 Low back pain, unspecified: Secondary | ICD-10-CM

## 2019-04-02 DIAGNOSIS — M546 Pain in thoracic spine: Secondary | ICD-10-CM

## 2019-04-08 LAB — COMPLIANCE DRUG ANALYSIS, UR

## 2019-04-16 ENCOUNTER — Encounter: Payer: Self-pay | Admitting: Emergency Medicine

## 2019-04-16 ENCOUNTER — Emergency Department
Admission: EM | Admit: 2019-04-16 | Discharge: 2019-04-16 | Disposition: A | Discharge status: Home / Self Care | Payer: Medicare PPO | Attending: Emergency Medicine | Admitting: Emergency Medicine

## 2019-04-16 ENCOUNTER — Other Ambulatory Visit: Payer: Self-pay

## 2019-04-16 ENCOUNTER — Emergency Department: Payer: Medicare PPO

## 2019-04-16 DIAGNOSIS — I10 Essential (primary) hypertension: Secondary | ICD-10-CM | POA: Insufficient documentation

## 2019-04-16 DIAGNOSIS — N390 Urinary tract infection, site not specified: Secondary | ICD-10-CM | POA: Insufficient documentation

## 2019-04-16 DIAGNOSIS — J069 Acute upper respiratory infection, unspecified: Secondary | ICD-10-CM | POA: Diagnosis not present

## 2019-04-16 DIAGNOSIS — Z791 Long term (current) use of non-steroidal anti-inflammatories (NSAID): Secondary | ICD-10-CM | POA: Insufficient documentation

## 2019-04-16 DIAGNOSIS — R05 Cough: Secondary | ICD-10-CM | POA: Diagnosis present

## 2019-04-16 DIAGNOSIS — Z91010 Allergy to peanuts: Secondary | ICD-10-CM | POA: Diagnosis not present

## 2019-04-16 DIAGNOSIS — U071 COVID-19: Secondary | ICD-10-CM | POA: Diagnosis not present

## 2019-04-16 DIAGNOSIS — Z79899 Other long term (current) drug therapy: Secondary | ICD-10-CM | POA: Diagnosis not present

## 2019-04-16 LAB — URINALYSIS, COMPLETE (UACMP) WITH MICROSCOPIC
Bilirubin Urine: NEGATIVE
Glucose, UA: NEGATIVE mg/dL
Hgb urine dipstick: NEGATIVE
Ketones, ur: NEGATIVE mg/dL
Nitrite: NEGATIVE
Protein, ur: NEGATIVE mg/dL
Specific Gravity, Urine: 1.019 (ref 1.005–1.030)
pH: 5 (ref 5.0–8.0)

## 2019-04-16 LAB — SARS CORONAVIRUS 2 (TAT 6-24 HRS): SARS Coronavirus 2: POSITIVE — AB

## 2019-04-16 MED ORDER — PSEUDOEPH-BROMPHEN-DM 30-2-10 MG/5ML PO SYRP: 5.0000 mL | ORAL_SOLUTION | Freq: Four times a day (QID) | ORAL | 0 refills | Status: DC | PRN

## 2019-04-16 MED ORDER — ACETAMINOPHEN 325 MG PO TABS
650.0000 mg | ORAL_TABLET | Freq: Once | ORAL | Status: AC
  Administered 2019-04-16: 650 mg via ORAL
  Filled 2019-04-16: qty 2

## 2019-04-16 MED ORDER — IBUPROFEN 600 MG PO TABS: 600.0000 mg | ORAL_TABLET | Freq: Three times a day (TID) | ORAL | 0 refills | Status: DC | PRN

## 2019-04-16 MED ORDER — SULFAMETHOXAZOLE-TRIMETHOPRIM 800-160 MG PO TABS: 1.0000 | ORAL_TABLET | Freq: Two times a day (BID) | ORAL | 0 refills | Status: DC

## 2019-04-16 MED ORDER — ONDANSETRON 8 MG PO TBDP
8.0000 mg | ORAL_TABLET | Freq: Once | ORAL | Status: AC
  Administered 2019-04-16: 11:00:00 8 mg via ORAL
  Filled 2019-04-16: qty 1

## 2019-04-16 NOTE — ED Triage Notes (Signed)
Pt reports fever, headache and cough for the last 2 days.

## 2019-04-16 NOTE — Discharge Instructions (Signed)
Follow discharge care instruction take medication directed.  Advised self quarantine pending results of COVID-19 test.  You may review test results in the "MyChart app".

## 2019-04-16 NOTE — ED Provider Notes (Signed)
[facility] Emergency Department Provider Note   ____________________________________________   First MD Initiated Contact with Patient 04/16/19 1010     (approximate)  I have reviewed the triage vital signs and the nursing notes.   HISTORY  Chief Complaint Fever, Cough, and Headache    HPI [name] [name] is a 49 y.o. female patient presents with fever, headache, nausea, cough for 2 days.  Patient denies recent travel or known exposure to COVID-19.  Patient has taken flu shot for this season.  Patient stated no vomiting or diarrhea with complaint.  Patient also complains of urinary frequency but denies dysuria.  Patient denies flank pain with urinary frequency.  Patient denies vaginal discharge.  States decreased food and fluid intake secondary to nausea.  Patient state cough is nonproductive.  Patient state headache is frontal.  Patient described headache as "pressure".  Patient rates the pain as 8/10.  Patient got up pain is "achy".  No other palliative measure for complaint.         Past Medical History:  Diagnosis Date  . Hypertension    controlled  . 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 Renal disorder   . Scoliosis of thoracic spine    "C" curve, convex to left  . Thyroid disease    controlled    Patient Active Problem List   Diagnosis Date Noted  . Neurofibroma 02/05/2019  . Back pain of thoracolumbar region 02/18/2015  . 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 Idiopathic scoliosis 02/18/2015  . Chest wall pain, chronic 12/25/2013  . Thoracic spine pain 07/26/2013  . Neuropathic pain 07/26/2013  . Neurofibromatosis, type 1 ([place]) 07/26/2013    Past Surgical History:  Procedure Laterality Date  . CESAREAN SECTION    . chamberlain procedure    . THYROIDECTOMY      Prior to Admission medications   Medication Sig Start Date End Date Taking? Authorizing Provider    acetaminophen (TYLENOL) 500 MG tablet Take 500 mg by mouth every 8 (eight) hours as needed. 3 tabs    [provider]  albuterol (PROVENTIL HFA;VENTOLIN HFA) 108 (90 BASE) MCG/ACT inhaler Inhale into the lungs every 6 (six) hours as needed for wheezing or shortness of breath.    [provider]  amitriptyline (ELAVIL) 10 MG tablet Take 10 mg by mouth at bedtime.    [provider]  amitriptyline (ELAVIL) 100 MG tablet Take 100 mg by mouth at bedtime.    [provider]  baclofen (LIORESAL) 10 MG tablet Take 10 mg by mouth 2 (two) times daily.    [provider]  brompheniramine-pseudoephedrine-DM 30-2-10 MG/5ML syrup Take 5 mLs by mouth 4 (four) times daily as needed. 04/16/19   Sable Feil, PA-C  gabapentin (NEURONTIN) 600 MG tablet Take 600 mg by mouth 4 (four) times daily.     [provider]  ibuprofen (ADVIL) 600 MG tablet Take 1 tablet (600 mg total) by mouth every 8 (eight) hours as needed. 04/16/19   Sable Feil, PA-C  meloxicam (MOBIC) 15 MG tablet Take 15 mg by mouth daily.    [provider]  metoprolol (LOPRESSOR) 100 MG tablet Take 100 mg by mouth daily.    [provider]  nortriptyline (PAMELOR) 10 MG capsule Take 10 mg by mouth 2 (two) times daily.    [provider]  pantoprazole (PROTONIX) 40 MG tablet Take 40 mg by mouth daily.  [provider]  ranitidine (ZANTAC) 150 MG tablet Take 150 mg by mouth daily.    [provider]  simvastatin (ZOCOR) 40 MG tablet Take 40 mg by mouth daily.    [provider]  sulfamethoxazole-trimethoprim (BACTRIM DS) 800-160 MG tablet Take 1 tablet by mouth 2 (two) times daily. 04/16/19   Sable Feil, PA-C  SUMAtriptan (IMITREX) 100 MG tablet Take 100 mg by mouth every 2 (two) hours as needed for migraine. May repeat in 2 hours if headache persists or recurs.    [provider]    Allergies Peanuts [peanut oil] and  Penicillins  Family History  Problem Relation Age of Onset  . Cancer Mother   . Breast cancer Neg Hx     Social History Social History   Tobacco Use  . Smoking status: Never Smoker  . Smokeless tobacco: Never Used  . Tobacco comment: this patient is a nonsmoker   Substance Use Topics  . Alcohol use: No    Alcohol/week: 0.0 standard drinks  . Drug use: No    Review of Systems Constitutional fever/chills.  Body aches. Eyes: No visual changes. ENT: No sore throat. Cardiovascular: Denies chest pain. Respiratory: Denies shortness of breath.  Nonproductive cough. Gastrointestinal: No abdominal pain.  Nausea or vomiting.  No diarrhea.  No constipation. Genitourinary: Negative for dysuria.  Urinary frequency. Musculoskeletal: Negative for back pain. Skin: Negative for rash. Neurological: Positive for headaches, but denies focal weakness or numbness.  Endocrine:  Hypertension hypothyroidism. Allergic/Immunilogical: Peanuts and penicillin.  ____________________________________________   PHYSICAL EXAM:  VITAL SIGNS: ED Triage Vitals  Enc Vitals Group     BP 04/16/19 1019 131/87     Pulse Rate 04/16/19 1019 (!) 109     Resp 04/16/19 1019 20     Temp 04/16/19 1019 (!) 101 F (38.3 C)     Temp Source 04/16/19 1019 Oral     SpO2 04/16/19 1019 97 %     Weight 04/16/19 1005 149 lb (67.6 kg)     Height 04/16/19 1005 5\' 1"  (1.549 m)     Head Circumference --      Peak Flow --      Pain Score 04/16/19 1005 8     Pain Loc --      Pain Edu? --      Excl. in [place]? --    Constitutional: Alert and oriented. Well appearing and in no acute distress. Nose: Edematous nasal turbinates clear rhinorrhea.  Mouth/Throat: Mucous membranes are moist.  Oropharynx non-erythematous.  Postnasal drainage. Neck: No stridor.   Cardiovascular: Normal rate, regular rhythm. Grossly normal heart sounds.  Good peripheral circulation. Respiratory: Normal respiratory effort.  No retractions. Lungs  CTAB. Gastrointestinal: Soft and nontender. No distention. No abdominal bruits. No CVA tenderness. Genitourinary: Deferred Musculoskeletal: No lower extremity tenderness nor edema.  No joint effusions. Neurologic:  Normal speech and language. No gross focal neurologic deficits are appreciated. No gait instability. Skin:  Skin is warm, dry and intact. No rash noted. Psychiatric: Mood and affect are normal. Speech and behavior are normal.  ____________________________________________   LABS (all labs ordered are listed, but only abnormal results are displayed)  Labs Reviewed  URINALYSIS, COMPLETE (UACMP) WITH MICROSCOPIC - Abnormal; Notable for the following components:      Result Value   Color, Urine YELLOW (*)    APPearance CLOUDY (*)    Leukocytes,Ua LARGE (*)    Bacteria, UA RARE (*)    Non Squamous Epithelial PRESENT (*)  All other components within normal limits  SARS CORONAVIRUS 2 (TAT 6-24 HRS)  URINE CULTURE   ____________________________________________  EKG   ____________________________________________  RADIOLOGY  ED MD interpretation:    Official radiology report(s): DG Chest Portable 1 View  Result Date: 04/16/2019 CLINICAL DATA:  Cough and fever EXAM: PORTABLE CHEST 1 VIEW COMPARISON:  September 26, 2015 FINDINGS: Lungs are clear. Heart size and pulmonary vascular normal. No adenopathy. Upper and midthoracic levoscoliosis with rotatory component again noted. IMPRESSION: Lungs clear.  Stable cardiac silhouette.  No adenopathy. Electronically Signed   By: Lowella Grip III M.D.   On: 04/16/2019 10:57    ____________________________________________   PROCEDURES  Procedure(s) performed (including Critical Care):  Procedures   ____________________________________________   INITIAL IMPRESSION / ASSESSMENT AND PLAN / ED COURSE  As part of my medical decision making, I reviewed the following data within the [facility]     Patient  presents with 2 days of fever, headache, cough, and nausea.  Patient also complain of urinary frequency.  Discussed negative checks x-ray findings with patient.  Discussed lab results consistent with UTI.  Patient physical exam consistent with viral respiratory illness and UTI.  Patient given discharge care instruction advised take medication as directed.  Patient advised self quarantine pending results of COVID-19 test.    [name] [name] was evaluated in Emergency Department on 04/16/2019 for the symptoms described in the history of present illness. She was evaluated in the context of the global COVID-19 pandemic, which necessitated consideration that the patient might be at risk for infection with the SARS-CoV-2 virus that causes COVID-19. Institutional protocols and algorithms that pertain to the evaluation of patients at risk for COVID-19 are in a state of rapid change based on information released by regulatory bodies including the CDC and federal and state organizations. These policies and algorithms were followed during the patient's care in the ED.       ____________________________________________   FINAL CLINICAL IMPRESSION(S) / ED DIAGNOSES  Final diagnoses:  Viral upper respiratory illness  Urinary tract infection without hematuria, site unspecified     ED Discharge Orders         Ordered    sulfamethoxazole-trimethoprim (BACTRIM DS) 800-160 MG tablet  2 times daily     04/16/19 1111    ibuprofen (ADVIL) 600 MG tablet  Every 8 hours PRN     04/16/19 1111    brompheniramine-pseudoephedrine-DM 30-2-10 MG/5ML syrup  4 times daily PRN     04/16/19 1111           Note:  This document was prepared using Dragon voice recognition software and may include unintentional dictation errors.    Sable Feil, PA-C 04/16/19 1115    Harvest Dark, MD 04/16/19 1400

## 2019-04-16 NOTE — ED Notes (Signed)
See triage note  States she is having some back pain,fever body aches and h/a  States sx's started 2 days ago febrile on arrival

## 2019-04-17 ENCOUNTER — Telehealth: Payer: Self-pay | Admitting: Emergency Medicine

## 2019-04-17 LAB — URINE CULTURE

## 2019-04-17 NOTE — Telephone Encounter (Signed)
Called patient to assure she is aware of covid result.  She is aware.

## 2019-05-14 ENCOUNTER — Other Ambulatory Visit: Payer: Self-pay | Admitting: Internal Medicine

## 2019-05-14 DIAGNOSIS — R748 Abnormal levels of other serum enzymes: Secondary | ICD-10-CM

## 2019-05-20 ENCOUNTER — Telehealth: Payer: Self-pay | Admitting: Student in an Organized Health Care Education/Training Program

## 2019-05-20 NOTE — Telephone Encounter (Signed)
Patient called stating she has not heard from Dr. Andres Shad office. I gave her the phone number to follow up on referral to their office.

## 2019-05-24 ENCOUNTER — Other Ambulatory Visit: Payer: Self-pay

## 2019-05-24 ENCOUNTER — Ambulatory Visit
Admission: RE | Admit: 2019-05-24 | Discharge: 2019-05-24 | Disposition: A | Discharge status: Home / Self Care | Payer: Medicare PPO | Source: Ambulatory Visit | Attending: Internal Medicine | Admitting: Internal Medicine

## 2019-05-24 DIAGNOSIS — R748 Abnormal levels of other serum enzymes: Secondary | ICD-10-CM

## 2019-05-27 ENCOUNTER — Other Ambulatory Visit: Payer: Self-pay | Admitting: Internal Medicine

## 2019-05-27 DIAGNOSIS — Z1231 Encounter for screening mammogram for malignant neoplasm of breast: Secondary | ICD-10-CM

## 2019-06-12 ENCOUNTER — Encounter: Payer: Self-pay | Admitting: Psychiatry

## 2019-06-12 ENCOUNTER — Other Ambulatory Visit: Payer: Self-pay

## 2019-06-12 ENCOUNTER — Ambulatory Visit (INDEPENDENT_AMBULATORY_CARE_PROVIDER_SITE_OTHER): Payer: Medicare PPO | Admitting: Psychiatry

## 2019-06-12 DIAGNOSIS — G894 Chronic pain syndrome: Secondary | ICD-10-CM

## 2019-06-12 NOTE — Progress Notes (Signed)
Psychiatric Initial Adult Assessment   I connected with  [name] [name] on 06/12/19 by a video enabled telemedicine application and verified that I am speaking with the correct person using two identifiers.   I discussed the limitations of evaluation and management by telemedicine. The patient expressed understanding and agreed to proceed.    Patient Identification: [name] [name] MRN:  [id number] Date of Evaluation:  06/12/2019   Referral Source: [facility]  Chief Complaint:   " I am in lot of pain in my back and my chest."  Visit Diagnosis:    ICD-10-CM   1. Chronic pain syndrome  G89.4     History of Present Illness: This is a 49 year old female with history of neurofibromatosis type I, chronic back pain, chest wall pain, hypertension, GERD, hyperlipidemia, migraines seen for psychiatric evaluation referred by pain management. Patient reported that she has had pain in her back that radiates to her chest for the past few years.  She stated that this impacts her quality of life.  She is unable to lift her young grandchildren and be physically active for them.  She stated that she has always had some difficulty with falling asleep however once she falls asleep she has no difficulty maintaining sleep. She reported that she started seeing a neurologist recently Dr. Melrose Nakayama who prescribed her with nortriptyline 20 mg at bedtime.  She stated that that has not helped much with her sleep.  She was prescribed Elavil prior to this which did not help much either. She reported feeling sad sometimes due to the chronic pain and other health issues however she denied anhedonia.  She denied feeling helpless or hopeless.  She denied any changes in her appetite or concentration.  She denied any suicidal ideations or suicide attempts. She did mention that she has been taking extra tablets of Tylenol to help her with pain which has resulted in elevated liver enzymes.  Her PCP has recommended  that she is off on Tylenol use.  She is trying to alternate it with Motrin.  She did state that she also takes Neurontin 600 mg 4 times a day which helps with her pain to some extent. She reported that she has not had any significant anxiety issues in the past however she contracted COVID-19 infection in early January and since then has been anxious about going out and getting infection again. She denied any symptoms suggestive of mania or hypomania.  She denied any symptom suggestive of psychosis. She denied any history of traumatic events and denied any PTSD symptoms. She denied any consumption of alcohol and also denied smoking cigarettes.  She denied use of any illicit substances. I have reviewed the PDMP during this encounter.   Associated Signs/Symptoms: Depression Symptoms:  denied (Hypo) Manic Symptoms:  denied Anxiety Symptoms:  worried about going out due to ongoing pandemic Psychotic Symptoms:  denied PTSD Symptoms: Negative  Past Psychiatric History: Depression- was taking Lexapro in the past.  Previous Psychotropic Medications: Yes   Substance Abuse History in the last 12 months:  No.  Consequences of Substance Abuse: Negative  Past Medical History:  Past Medical History:  Diagnosis Date  . Hypertension    controlled  . 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 Renal disorder   . Scoliosis of thoracic spine    "C" curve, convex to left  . Thyroid disease    controlled    Past Surgical History:  Procedure Laterality Date  .  CESAREAN SECTION    . chamberlain procedure    . THYROIDECTOMY      Family Psychiatric History: Denied any psychiatric issues in the family  Family History:  Family History  Problem Relation Age of Onset  . Cancer Mother   . Breast cancer Neg Hx     Social History:   Social History   Socioeconomic History  . Marital status: Single    Spouse name: Not on file  . Number of children: Not on file  .  Years of education: Not on file  . Highest education level: Not on file  Occupational History  . Not on file  Tobacco Use  . Smoking status: Never Smoker  . Smokeless tobacco: Never Used  . Tobacco comment: this patient is a nonsmoker   Substance and Sexual Activity  . Alcohol use: No    Alcohol/week: 0.0 standard drinks  . Drug use: No  . Sexual activity: Not on file  Other Topics Concern  . Not on file  Social History Narrative  . Not on file   Social Determinants of Health   Financial Resource Strain:   . Difficulty of Paying Living Expenses: Not on file  Food Insecurity:   . Worried About Charity fundraiser in the Last Year: Not on file  . Ran Out of Food in the Last Year: Not on file  Transportation Needs:   . Lack of Transportation (Medical): Not on file  . Lack of Transportation (Non-Medical): Not on file  Physical Activity:   . Days of Exercise per Week: Not on file  . Minutes of Exercise per Session: Not on file  Stress:   . Feeling of Stress : Not on file  Social Connections:   . Frequency of Communication with Friends and Family: Not on file  . Frequency of Social Gatherings with Friends and Family: Not on file  . Attends Religious Services: Not on file  . Active Member of Clubs or Organizations: Not on file  . Attends Archivist Meetings: Not on file  . Marital Status: Not on file    Additional Social History: Lives with her children and grandchildren.  Allergies:   Allergies  Allergen Reactions  . Peanuts [Peanut Oil] Hives and Other (See Comments)    Mouth ulcers  . Penicillins Hives and Rash    Metabolic Disorder Labs: No results found for: HGBA1C, MPG No results found for: PROLACTIN No results found for: CHOL, TRIG, HDL, CHOLHDL, VLDL, LDLCALC No results found for: TSH  Therapeutic Level Labs: No results found for: LITHIUM No results found for: CBMZ No results found for: VALPROATE  Current Medications: Current Outpatient  Medications  Medication Sig Dispense Refill  . acetaminophen (TYLENOL) 500 MG tablet Take 500 mg by mouth every 8 (eight) hours as needed. 3 tabs    . albuterol (PROVENTIL HFA;VENTOLIN HFA) 108 (90 BASE) MCG/ACT inhaler Inhale into the lungs every 6 (six) hours as needed for wheezing or shortness of breath.    Marland Kitchen amitriptyline (ELAVIL) 10 MG tablet Take 10 mg by mouth at bedtime.    Marland Kitchen amitriptyline (ELAVIL) 100 MG tablet Take 100 mg by mouth at bedtime.    . baclofen (LIORESAL) 10 MG tablet Take 10 mg by mouth 2 (two) times daily.    . brompheniramine-pseudoephedrine-DM 30-2-10 MG/5ML syrup Take 5 mLs by mouth 4 (four) times daily as needed. 120 mL 0  . gabapentin (NEURONTIN) 600 MG tablet Take 600 mg by mouth 4 (four) times  daily.     . ibuprofen (ADVIL) 600 MG tablet Take 1 tablet (600 mg total) by mouth every 8 (eight) hours as needed. 15 tablet 0  . meloxicam (MOBIC) 15 MG tablet Take 15 mg by mouth daily.    . metoprolol (LOPRESSOR) 100 MG tablet Take 100 mg by mouth daily.    . nortriptyline (PAMELOR) 10 MG capsule Take 10 mg by mouth 2 (two) times daily.    . pantoprazole (PROTONIX) 40 MG tablet Take 40 mg by mouth daily.    . ranitidine (ZANTAC) 150 MG tablet Take 150 mg by mouth daily.    . simvastatin (ZOCOR) 40 MG tablet Take 40 mg by mouth daily.    Marland Kitchen sulfamethoxazole-trimethoprim (BACTRIM DS) 800-160 MG tablet Take 1 tablet by mouth 2 (two) times daily. 20 tablet 0  . SUMAtriptan (IMITREX) 100 MG tablet Take 100 mg by mouth every 2 (two) hours as needed for migraine. May repeat in 2 hours if headache persists or recurs.     No current facility-administered medications for this visit.    Musculoskeletal: Strength & Muscle Tone: unable to assess due to telemed visit Gait & Station: unable to assess due to telemed visit Patient leans: unable to assess due to telemed visit   Psychiatric Specialty Exam: Review of Systems  There were no vitals taken for this visit.There is no  height or weight on file to calculate BMI.  General Appearance: Fairly Groomed  Eye Contact:  Good  Speech:  Clear and Coherent and Normal Rate  Volume:  Normal  Mood:  Euthymic  Affect:  Appropriate and Congruent  Thought Process:  Goal Directed and Descriptions of Associations: Intact  Orientation:  Full (Time, Place, and Person)  Thought Content:  Logical  Suicidal Thoughts:  No  Homicidal Thoughts:  No  Memory:  Negative Immediate;   Good Recent;   Good  Judgement:  Good  Insight:  Good  Psychomotor Activity:  Decreased due to chronic pain  Concentration:  Concentration: Good and Attention Span: Good  Recall:  Good  Fund of Knowledge:Good  Language: Good  Akathisia:  Negative  Handed:  Right  AIMS (if indicated):  not done  Assets:  Communication Skills Desire for Improvement Financial Resources/Insurance Housing Social Support  ADL's:  Intact  Cognition: WNL  Sleep:  Fair   Screenings: PHQ2-9     Clinical Support from 10/09/2015 in [place] Office Visit from 07/15/2015 in [place] Procedure visit from 06/16/2015 in [place] Procedure visit from 05/20/2015 in [place] from 04/21/2015 in [place]  PHQ-2 Total Score  0  0  0  0  0      Assessment and Plan: This is a 49 year old female with history of neurofibromatosis type I, chronic back pain, chest wall pain, hypertension, GERD, hyperlipidemia, migraines seen for psychiatric evaluation referred by pain management.  Patient was referred to the clinic for routine assessment of possible mental health/substance abuse risk potential by her pain management provider prior to initiation of pain medications.  The following instruments were used: Clinical interview Screener and opioid  assessment for patients with pain/revised (SOAPP) Opioid risk tool Drug abuse screening questionnaire Alcohol use disorder identification test PHQ-9 GAD 7   I have reviewed the PDMP during this encounter.    Based on clinical interview and instruments used at  the time of evaluation, the risk for prescription opioid misuse is determined to be low.  Continue f/up with PCP and neurology. F/up with psychiatry as needed.  [place] Crane, MD 3/3/20219:14 AM

## 2019-06-18 ENCOUNTER — Ambulatory Visit
Payer: Medicare PPO | Attending: Student in an Organized Health Care Education/Training Program | Admitting: Student in an Organized Health Care Education/Training Program

## 2019-06-18 ENCOUNTER — Other Ambulatory Visit: Payer: Self-pay

## 2019-06-18 DIAGNOSIS — Z0289 Encounter for other administrative examinations: Secondary | ICD-10-CM

## 2019-06-18 DIAGNOSIS — R0789 Other chest pain: Secondary | ICD-10-CM

## 2019-06-18 DIAGNOSIS — M545 Low back pain, unspecified: Secondary | ICD-10-CM

## 2019-06-18 DIAGNOSIS — M4127 Other idiopathic scoliosis, lumbosacral region: Secondary | ICD-10-CM

## 2019-06-18 DIAGNOSIS — Q8501 Neurofibromatosis, type 1: Secondary | ICD-10-CM

## 2019-06-18 DIAGNOSIS — M792 Neuralgia and neuritis, unspecified: Secondary | ICD-10-CM

## 2019-06-18 DIAGNOSIS — G894 Chronic pain syndrome: Secondary | ICD-10-CM

## 2019-06-18 DIAGNOSIS — G8929 Other chronic pain: Secondary | ICD-10-CM

## 2019-06-18 DIAGNOSIS — M546 Pain in thoracic spine: Secondary | ICD-10-CM

## 2019-06-18 MED ORDER — HYDROCODONE-ACETAMINOPHEN 7.5-325 MG PO TABS: 0.5000 | ORAL_TABLET | Freq: Every day | ORAL | 0 refills | Status: DC | PRN

## 2019-06-18 NOTE — Progress Notes (Signed)
Patient: [name] [name]  Service Category: E/M  Provider: Gillis Santa, MD  DOB: 04-29-1970  DOS: 06/18/2019  Location: Office  MRN: [id number]  Setting: Ambulatory outpatient  Referring Provider: Perrin Maltese, MD  Type: Established Patient  Specialty: Interventional Pain Management  PCP: Perrin Maltese, MD  Location: Home  Delivery: TeleHealth     Virtual Encounter - Pain Management PROVIDER NOTE: Information contained herein reflects review and annotations entered in association with encounter. Interpretation of such information and data should be left to medically-trained personnel. Information provided to patient can be located elsewhere in the medical record under "Patient Instructions". Document created using STT-dictation technology, any transcriptional errors that may result from process are unintentional.    Contact & Pharmacy Preferred: [phone] Home: [phone] (home) Mobile: [phone] (mobile) E-mail: [email or web address]  [facility] [place], [place] - [address] [place] [address] Phone: [phone] Fax: [phone]  [id number] [address], [address] [place] [address] Phone: [phone] Fax: [phone]   Pre-screening  [name] [name] offered "in-person" vs "virtual" encounter. She indicated preferring virtual for this encounter.   Reason COVID-19*  Social distancing based on CDC and AMA recommendations.   I contacted [name] [name] on 06/18/2019 via telephone.      I clearly identified myself as Gillis Santa, MD. I verified that I was speaking with the correct person using two identifiers (Name: [name] [name], and date of birth: 1971/03/03).  Consent I sought verbal advanced consent from [name] [name] for virtual visit interactions. I informed [name] [name] of possible security and privacy concerns, risks, and limitations  associated with providing "not-in-person" medical evaluation and management services. I also informed [name] [name] of the availability of "in-person" appointments. Finally, I informed her that there would be a charge for the virtual visit and that she could be  personally, fully or partially, financially responsible for it. [name] [name] expressed understanding and agreed to proceed.   Historic Elements   [name] [name] is a 49 y.o. year old, female patient evaluated today after her last contact with our practice on 05/20/2019. [name] [name]  has a past medical history of Hypertension, Neurofibromatosis ([place]), Renal disorder, Scoliosis of thoracic spine, and Thyroid disease. She also  has a past surgical history that includes Cesarean section; chamberlain procedure; and Thyroidectomy. [name] [name] has a current medication list which includes the following prescription(s): acetaminophen, albuterol, amitriptyline, amitriptyline, baclofen, brompheniramine-pseudoephedrine-dm, gabapentin, ibuprofen, meloxicam, metoprolol tartrate, nortriptyline, pantoprazole, ranitidine, simvastatin, sulfamethoxazole-trimethoprim, sumatriptan, hydrocodone-acetaminophen, and [START ON 07/18/2019] hydrocodone-acetaminophen. She  reports that she has never smoked. She has never used smokeless tobacco. She reports that she does not drink alcohol or use drugs. [name] [name] is allergic to peanuts [peanut oil] and penicillins.   HPI  Today, she is being contacted for medication management.   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  Patient also has idiopathic scoliosis.  In the past she was utilizing tramadol prescribed by her PCP which became less effective and prompted a referral to pain management.  Patient is on a multimodal pain regimen managed by her PCP.  She has completed her psychiatric evaluation and is at low risk for opioid misuse abuse.  We will start low-dose hydrocodone 7.5 mg, 0.5 tablet to 1 tablet to be taken  daily as needed for breakthrough pain.  Risks and benefits of  this medication were reviewed.  Pharmacotherapy Assessment  Analgesic: Initiate hydrocodone, 7.5 mg, 0.5 tablet to 1 tablet daily as needed breakthrough pain  Monitoring: [place] PMP: PDMP reviewed during this encounter.       Pharmacotherapy: No side-effects or adverse reactions reported. Compliance: No problems identified. Effectiveness: Clinically acceptable. Plan: Refer to "POC".  UDS:  Summary  Date Value Ref Range Status  04/03/2019 Note  Final    Comment:    ==================================================================== Compliance Drug Analysis, Ur ==================================================================== Test                             Result       Flag       Units Drug Present and Declared for Prescription Verification   Gabapentin                     PRESENT      EXPECTED   Baclofen                       PRESENT      EXPECTED   Nortriptyline                  PRESENT      EXPECTED    Nortriptyline may be administered as a prescription drug; it is also    an expected metabolite of amitriptyline.   Acetaminophen                  PRESENT      EXPECTED   Metoprolol                     PRESENT      EXPECTED Drug Present not Declared for Prescription Verification   Ibuprofen                      PRESENT      UNEXPECTED Drug Absent but Declared for Prescription Verification   Amitriptyline                  Not Detected UNEXPECTED    Amitriptyline is almost always present in patients taking this drug    consistently. Absence of amitriptyline could be due to lapse of time    since the last dose or unusual pharmacokinetics (rapid metabolism). ==================================================================== Test                      Result    Flag   Units      Ref Range   Creatinine              198              mg/dL       >=20 ==================================================================== Declared Medications:  The flagging and interpretation on this report are based on the  following declared medications.  Unexpected results may arise from  inaccuracies in the declared medications.  **Note: The testing scope of this panel includes these medications:  Amitriptyline  Baclofen  Gabapentin  Metoprolol  Nortriptyline  **Note: The testing scope of this panel does not include small to  moderate amounts of these reported medications:  Acetaminophen  **Note: The testing scope of this panel does not include the  following reported medications:  Albuterol  Cephalexin  Meloxicam  Pantoprazole  Ranitidine  Simvastatin  Sulfamethoxazole  Sumatriptan  Trimethoprim ==================================================================== For clinical consultation, please call [phone]. ====================================================================    Laboratory Chemistry Profile   Renal Lab Results  Component Value Date   BUN 12 05/24/2018   CREATININE 0.65 05/24/2018   GFRAA >60 05/24/2018   GFRNONAA >60 05/24/2018    Hepatic Lab Results  Component Value Date   AST 25 05/24/2018   ALT 29 05/24/2018   ALBUMIN 3.8 05/24/2018   ALKPHOS 103 05/24/2018   LIPASE 28 05/24/2018    Electrolytes Lab Results  Component Value Date   NA 139 05/24/2018   K 3.1 (L) 05/24/2018   CL 104 05/24/2018   CALCIUM 9.0 05/24/2018    Bone No results found for: VD25OH, VD125OH2TOT, TX7741SE3, TR3202BX4, 25OHVITD1, 25OHVITD2, 25OHVITD3, TESTOFREE, TESTOSTERONE  Inflammation (CRP: Acute Phase) (ESR: Chronic Phase) No results found for: CRP, ESRSEDRATE, LATICACIDVEN    Note: Above Lab results reviewed.  Imaging  US Abdomen Limited RUQ CLINICAL DATA:  Initial evaluation for elevated liver enzymes. History of prior cholecystectomy.  EXAM: ULTRASOUND ABDOMEN LIMITED RIGHT UPPER  QUADRANT  COMPARISON:  Prior CT from 08/28/2017.  FINDINGS: Gallbladder:  Prior cholecystectomy.  No abnormality about the gallbladder fossa.  Common bile duct:  Diameter: 4.1 mm  Liver:  No focal lesion identified. Within normal limits in parenchymal echogenicity. Portal vein is patent on color Doppler imaging with normal direction of blood flow towards the liver.  Other: None.  IMPRESSION: 1. Prior cholecystectomy. 2. Otherwise unremarkable right upper quadrant ultrasound. No biliary dilatation. Normal sonographic appearance of the liver.  Electronically Signed   By: Jeannine Boga M.D.   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 Diagnoses of Other idiopathic scoliosis, lumbosacral region, Back pain of thoracolumbar region, Chest wall pain, chronic, Thoracic spine pain, Neuropathic pain, Chronic pain syndrome, and Pain medication agreement signed were also pertinent to this visit.  Plan of Care   [name] [name] has a current medication list which includes the following long-term medication(s): albuterol, amitriptyline, amitriptyline, gabapentin, metoprolol tartrate, nortriptyline, pantoprazole, ranitidine, simvastatin, and sumatriptan.  Pharmacotherapy (Medications Ordered): Meds ordered this encounter  Medications  . HYDROcodone-acetaminophen (NORCO) 7.5-325 MG tablet    Sig: Take 0.5-1 tablets by mouth daily as needed for severe pain. Must last 30 days.    Dispense:  30 tablet    Refill:  0    Chronic Pain. (STOP Act - Not applicable). Fill one day early if closed on scheduled refill date.  Marland Kitchen HYDROcodone-acetaminophen (NORCO) 7.5-325 MG tablet    Sig: Take 0.5-1 tablets by mouth daily as needed for severe pain. Must last 30 days.    Dispense:  30 tablet    Refill:  0    Chronic Pain. (STOP Act - Not applicable). Fill one day early if closed on scheduled refill  date.   Follow-up plan:   Return in about 8 weeks (around 08/13/2019) for Medication Management, in person.     Recent Visits Date Type Provider Dept  04/02/19 Office Visit Gillis Santa, MD [facility]  Showing recent visits within past 90 days and meeting all other requirements   Today's Visits Date Type Provider Dept  06/18/19 Office Visit Gillis Santa, MD [facility]  Showing today's visits and meeting all other requirements   Future Appointments No visits were found meeting these conditions.  Showing future appointments within next 90 days and meeting all other requirements   I discussed the assessment and treatment plan with the patient. The patient was provided  an opportunity to ask questions and all were answered. The patient agreed with the plan and demonstrated an understanding of the instructions.  Patient advised to call back or seek an in-person evaluation if the symptoms or condition worsens.  Duration of encounter: 76mnutes.  Note by: BGillis Santa MD Date: 06/18/2019; Time: 3:30 PM

## 2019-06-19 ENCOUNTER — Ambulatory Visit
Admission: RE | Admit: 2019-06-19 | Discharge: 2019-06-19 | Disposition: A | Discharge status: Home / Self Care | Payer: Medicare PPO | Source: Ambulatory Visit | Attending: Internal Medicine | Admitting: Internal Medicine

## 2019-06-19 DIAGNOSIS — Z1231 Encounter for screening mammogram for malignant neoplasm of breast: Secondary | ICD-10-CM | POA: Diagnosis present

## 2019-07-15 ENCOUNTER — Ambulatory Visit: Payer: Self-pay | Admitting: Urology

## 2019-07-17 ENCOUNTER — Ambulatory Visit: Payer: Self-pay | Admitting: Urology

## 2019-08-12 ENCOUNTER — Other Ambulatory Visit: Payer: Self-pay

## 2019-08-12 ENCOUNTER — Ambulatory Visit
Payer: Medicare PPO | Attending: Student in an Organized Health Care Education/Training Program | Admitting: Student in an Organized Health Care Education/Training Program

## 2019-08-12 ENCOUNTER — Encounter: Payer: Self-pay | Admitting: Student in an Organized Health Care Education/Training Program

## 2019-08-12 DIAGNOSIS — M792 Neuralgia and neuritis, unspecified: Secondary | ICD-10-CM | POA: Diagnosis not present

## 2019-08-12 DIAGNOSIS — Q8501 Neurofibromatosis, type 1: Secondary | ICD-10-CM | POA: Diagnosis not present

## 2019-08-12 DIAGNOSIS — G894 Chronic pain syndrome: Secondary | ICD-10-CM | POA: Diagnosis not present

## 2019-08-12 MED ORDER — HYDROCODONE-ACETAMINOPHEN 7.5-325 MG PO TABS: 0.5000 | ORAL_TABLET | Freq: Every day | ORAL | 0 refills | Status: DC | PRN

## 2019-08-12 NOTE — Progress Notes (Signed)
Nursing Pain Medication Assessment:  Safety precautions to be maintained throughout the outpatient stay will include: orient to surroundings, keep bed in low position, maintain call bell within reach at all times, provide assistance with transfer out of bed and ambulation.  Medication Inspection Compliance: Pill count conducted under aseptic conditions, in front of the patient. Neither the pills nor the bottle was removed from the patient's sight at any time. Once count was completed pills were immediately returned to the patient in their original bottle.  Medication: Hydrocodone/APAP Pill/Patch Count: 8 of 30 pills remain Pill/Patch Appearance: Markings consistent with prescribed medication Bottle Appearance: Standard pharmacy container. Clearly labeled. Filled Date: 04 / 11 / 2021 Last Medication intake:  Today

## 2019-08-12 NOTE — Progress Notes (Signed)
PROVIDER NOTE: Information contained herein reflects review and annotations entered in association with encounter. Interpretation of such information and data should be left to medically-trained personnel. Information provided to patient can be located elsewhere in the medical record under "Patient Instructions". Document created using STT-dictation technology, any transcriptional errors that may result from process are unintentional.    Patient: [name] [name]  Service Category: E/M  Provider: Gillis Santa, MD  DOB: September 23, 1970  DOS: 08/12/2019  Referring Provider: Perrin Maltese, MD  MRN: [id number]  Setting: Ambulatory outpatient  PCP: Perrin Maltese, MD  Type: Established Patient  Specialty: Interventional Pain Management    Location: Office  Delivery: Face-to-face     Primary Reason(s) for Visit: Encounter for prescription drug management. (Level of risk: moderate)  CC: Generalized Body Aches (neurofibromatosis NF1.  tumors throughout nerves on body )  HPI  [name] [name] is a 49 y.o. year old, female patient, who comes today for a medication management evaluation. She has Back pain of thoracolumbar region; Neurofibromatosis, type 1 (von Recklinghausen's disease) ([name] [name]); Idiopathic scoliosis; Chest wall pain, chronic; Thoracic spine pain; Neurofibroma; Neuropathic pain; Neurofibromatosis, type 1 ([name] [name]); and Chronic pain syndrome on their problem list. Her primarily concern today is the Generalized Body Aches (neurofibromatosis NF1.  tumors throughout nerves on body )  Pain Assessment: Location: Other (Comment)(different locations on body) Other (Comment)(nerve pain coming from tumors that are growing) Radiating: denies Onset: More than a month ago Duration: Chronic pain Quality: Discomfort, Constant, Stabbing, Sharp(electrical impulses) Severity: 3 /10 (subjective, self-reported pain score)  Note: Reported level is compatible with observation.                         When using our objective Pain  Scale, levels between 6 and 10/10 are said to belong in an emergency room, as it progressively worsens from a 6/10, described as severely limiting, requiring emergency care not usually available at an outpatient pain management facility. At a 6/10 level, communication becomes difficult and requires great effort. Assistance to reach the emergency department may be required. Facial flushing and profuse sweating along with potentially dangerous increases in heart rate and blood pressure will be evident. Effect on ADL: limiting. Timing: Constant Modifying factors: medications or laying/sitting down. BP: (!) 139/95  HR: (!) 123  [name] [name] was last scheduled for an appointment on 06/18/2019 for medication management. During today's appointment we reviewed [name] [name]'s chronic pain status, as well as her outpatient medication regimen.  No change in medical history since last visit.  Patient's pain is at baseline.  Patient continues multimodal pain regimen as prescribed.  States that it provides pain relief and improvement in functional status.  The patient  reports no history of drug use. Her body mass index is 27.7 kg/m.  Further details on both, my assessment(s), as well as the proposed treatment plan, please see below.  Controlled Substance Pharmacotherapy Assessment REMS (Risk Evaluation and Mitigation Strategy)  Analgesic: 07/21/2019  1   06/18/2019  Hydrocodone-Acetamin 7.5-325  30.00  30 Bi Lat   [id number]   [facility] (5798)   0  7.50 MME  Medicare   [place]     [name] Billow, RN  08/12/2019  1:37 PM  Sign when Signing Visit Nursing Pain Medication Assessment:  Safety precautions to be maintained throughout the outpatient stay will include: orient to surroundings, keep bed in low position, maintain call bell within reach at all times, provide assistance with transfer out of  bed and ambulation.  Medication Inspection Compliance: Pill count conducted under aseptic conditions, in front of the patient.  Neither the pills nor the bottle was removed from the patient's sight at any time. Once count was completed pills were immediately returned to the patient in their original bottle.  Medication: Hydrocodone/APAP Pill/Patch Count: 8 of 30 pills remain Pill/Patch Appearance: Markings consistent with prescribed medication Bottle Appearance: Standard pharmacy container. Clearly labeled. Filled Date: 04 / 11 / 2021 Last Medication intake:  Today  Pharmacokinetics: Liberation and absorption (onset of action): WNL Distribution (time to peak effect): WNL Metabolism and excretion (duration of action): WNL         Pharmacodynamics: Desired effects: Analgesia: [name] [name] reports >50% benefit. Functional ability: Patient reports that medication allows her to accomplish basic ADLs Clinically meaningful improvement in function (CMIF): Sustained CMIF goals met Perceived effectiveness: Described as relatively effective, allowing for increase in activities of daily living (ADL) Undesirable effects: Side-effects or Adverse reactions: None reported Monitoring: [place] PMP: PDMP not reviewed this encounter. Online review of the past 45-monthperiod conducted. Compliant with practice rules and regulations Last UDS on record: Summary  Date Value Ref Range Status  04/03/2019 Note  Final    Comment:    ==================================================================== Compliance Drug Analysis, Ur ==================================================================== Test                             Result       Flag       Units Drug Present and Declared for Prescription Verification   Gabapentin                     PRESENT      EXPECTED   Baclofen                       PRESENT      EXPECTED   Nortriptyline                  PRESENT      EXPECTED    Nortriptyline may be administered as a prescription drug; it is also    an expected metabolite of amitriptyline.   Acetaminophen                  PRESENT       EXPECTED   Metoprolol                     PRESENT      EXPECTED Drug Present not Declared for Prescription Verification   Ibuprofen                      PRESENT      UNEXPECTED Drug Absent but Declared for Prescription Verification   Amitriptyline                  Not Detected UNEXPECTED    Amitriptyline is almost always present in patients taking this drug    consistently. Absence of amitriptyline could be due to lapse of time    since the last dose or unusual pharmacokinetics (rapid metabolism). ==================================================================== Test                      Result    Flag   Units      Ref Range   Creatinine  198              mg/dL      >=20 ==================================================================== Declared Medications:  The flagging and interpretation on this report are based on the  following declared medications.  Unexpected results may arise from  inaccuracies in the declared medications.  **Note: The testing scope of this panel includes these medications:  Amitriptyline  Baclofen  Gabapentin  Metoprolol  Nortriptyline  **Note: The testing scope of this panel does not include small to  moderate amounts of these reported medications:  Acetaminophen  **Note: The testing scope of this panel does not include the  following reported medications:  Albuterol  Cephalexin  Meloxicam  Pantoprazole  Ranitidine  Simvastatin  Sulfamethoxazole  Sumatriptan  Trimethoprim ==================================================================== For clinical consultation, please call [phone]. ====================================================================    UDS interpretation: Compliant          Medication Assessment Form: Reviewed. Patient indicates being compliant with therapy Treatment compliance: Compliant Risk Assessment Profile: Aberrant behavior: See initial evaluations. None observed or detected today Comorbid  factors increasing risk of overdose: See initial evaluation. No additional risks detected today Opioid risk tool (ORT):  Opioid Risk  08/12/2019  Alcohol 0  Illegal Drugs 0  Rx Drugs 0  Alcohol 0  Illegal Drugs 0  Rx Drugs 0  Age between 16-45 years  0  History of Preadolescent Sexual Abuse -  Psychological Disease 0  Depression 0  Opioid Risk Tool Scoring 0  Opioid Risk Interpretation Low Risk    ORT Scoring interpretation table:  Score <3 = Low Risk for SUD  Score between 4-7 = Moderate Risk for SUD  Score >8 = High Risk for Opioid Abuse   Risk of substance use disorder (SUD): Low  Risk Mitigation Strategies:  Patient Counseling: Covered Patient-Prescriber Agreement (PPA): Present and active  Notification to other healthcare providers: Done  Pharmacologic Plan: No change in therapy, at this time.             Laboratory Chemistry Profile   Renal Lab Results  Component Value Date   BUN 12 05/24/2018   CREATININE 0.65 05/24/2018   GFRAA >60 05/24/2018   GFRNONAA >60 05/24/2018   SPECGRAV >1.030 (H) 01/17/2019   PHUR 5.5 01/17/2019   PROTEINUR NEGATIVE 04/16/2019     Electrolytes Lab Results  Component Value Date   NA 139 05/24/2018   K 3.1 (L) 05/24/2018   CL 104 05/24/2018   CALCIUM 9.0 05/24/2018     Hepatic Lab Results  Component Value Date   AST 25 05/24/2018   ALT 29 05/24/2018   ALBUMIN 3.8 05/24/2018   ALKPHOS 103 05/24/2018   LIPASE 28 05/24/2018     ID Lab Results  Component Value Date   SARSCOV2NAA POSITIVE (A) 04/16/2019   PREGTESTUR NEGATIVE 05/12/2018     Bone No results found for: [place], [id number], [id number], UX8333OV2, 25OHVITD1, 25OHVITD2, 25OHVITD3, TESTOFREE, TESTOSTERONE   Endocrine Lab Results  Component Value Date   GLUCOSE 93 05/24/2018   GLUCOSEU NEGATIVE 04/16/2019     Neuropathy No results found for: VITAMINB12, FOLATE, HGBA1C, HIV   CNS No results found for: COLORCSF, APPEARCSF, RBCCOUNTCSF, WBCCSF, POLYSCSF,  LYMPHSCSF, EOSCSF, PROTEINCSF, GLUCCSF, JCVIRUS, CSFOLI, IGGCSF, LABACHR, ACETBL, LABACHR, ACETBL   Inflammation (CRP: Acute  ESR: Chronic) No results found for: CRP, ESRSEDRATE, LATICACIDVEN   Rheumatology No results found for: RF, ANA, LABURIC, URICUR, LYMEIGGIGMAB, LYMEABIGMQN, HLAB27   Coagulation Lab Results  Component Value Date   PLT 292 05/24/2018  Cardiovascular Lab Results  Component Value Date   TROPONINI <0.03 12/23/2016   HGB 11.0 (L) 05/24/2018   HCT 34.1 (L) 05/24/2018     Screening Lab Results  Component Value Date   SARSCOV2NAA POSITIVE (A) 04/16/2019   PREGTESTUR NEGATIVE 05/12/2018     Cancer No results found for: CEA, CA125, LABCA2   Allergens No results found for: ALMOND, APPLE, ASPARAGUS, AVOCADO, BANANA, BARLEY, BASIL, BAYLEAF, GREENBEAN, LIMABEAN, WHITEBEAN, BEEFIGE, REDBEET, BLUEBERRY, BROCCOLI, CABBAGE, MELON, CARROT, CASEIN, CASHEWNUT, CAULIFLOWER, CELERY     Note: Lab results reviewed.   Recent Diagnostic Imaging Results  MM 3D SCREEN BREAST BILATERAL CLINICAL DATA:  Screening.  EXAM: DIGITAL SCREENING BILATERAL MAMMOGRAM WITH TOMO AND CAD  COMPARISON:  Previous exam(s).  ACR Breast Density Category c: The breast tissue is heterogeneously dense, which may obscure small masses.  FINDINGS: There are no findings suspicious for malignancy. Images were processed with CAD.  IMPRESSION: No mammographic evidence of malignancy. A result letter of this screening mammogram will be mailed directly to the patient.  RECOMMENDATION: Screening mammogram in one year. (Code:[id number]-B-01Y)  BI-RADS CATEGORY  1: Negative.  Electronically Signed   By: Claudie Revering M.D.   On: 06/19/2019 14:42  Complexity Note: Imaging results reviewed. Results shared with [name] [name], using Layman's terms.                               Meds   Current Outpatient Medications:  .  acetaminophen (TYLENOL) 500 MG tablet, Take 500 mg by mouth every 8 (eight) hours  as needed. 3 tabs, Disp: , Rfl:  .  albuterol (PROVENTIL HFA;VENTOLIN HFA) 108 (90 BASE) MCG/ACT inhaler, Inhale into the lungs every 6 (six) hours as needed for wheezing or shortness of breath., Disp: , Rfl:  .  amitriptyline (ELAVIL) 10 MG tablet, Take 10 mg by mouth at bedtime., Disp: , Rfl:  .  baclofen (LIORESAL) 10 MG tablet, Take 10 mg by mouth 2 (two) times daily., Disp: , Rfl:  .  gabapentin (NEURONTIN) 600 MG tablet, Take 600 mg by mouth 4 (four) times daily. , Disp: , Rfl:  .  [START ON 08/19/2019] HYDROcodone-acetaminophen (NORCO) 7.5-325 MG tablet, Take 0.5-1 tablets by mouth daily as needed for severe pain. Must last 30 days., Disp: 30 tablet, Rfl: 0 .  [START ON 09/18/2019] HYDROcodone-acetaminophen (NORCO) 7.5-325 MG tablet, Take 0.5-1 tablets by mouth daily as needed for severe pain. Must last 30 days., Disp: 30 tablet, Rfl: 0 .  [START ON 10/18/2019] HYDROcodone-acetaminophen (NORCO) 7.5-325 MG tablet, Take 0.5-1 tablets by mouth daily as needed for severe pain. Must last 30 days., Disp: 30 tablet, Rfl: 0 .  ibuprofen (ADVIL) 600 MG tablet, Take 1 tablet (600 mg total) by mouth every 8 (eight) hours as needed., Disp: 15 tablet, Rfl: 0 .  metoprolol (LOPRESSOR) 100 MG tablet, Take 100 mg by mouth daily., Disp: , Rfl:  .  nortriptyline (PAMELOR) 10 MG capsule, Take 10 mg by mouth 2 (two) times daily., Disp: , Rfl:  .  pantoprazole (PROTONIX) 40 MG tablet, Take 40 mg by mouth daily., Disp: , Rfl:  .  rosuvastatin (CRESTOR) 40 MG tablet, Take 40 mg by mouth daily., Disp: , Rfl:  .  SUMAtriptan (IMITREX) 100 MG tablet, Take 100 mg by mouth every 2 (two) hours as needed for migraine. May repeat in 2 hours if headache persists or recurs., Disp: , Rfl:  .  meloxicam (MOBIC) 15 MG  tablet, Take 15 mg by mouth daily., Disp: , Rfl:  .  ranitidine (ZANTAC) 150 MG tablet, Take 150 mg by mouth daily., Disp: , Rfl:  .  simvastatin (ZOCOR) 40 MG tablet, Take 40 mg by mouth daily., Disp: , Rfl:   ROS   Constitutional: Denies any fever or chills Gastrointestinal: No reported hemesis, hematochezia, vomiting, or acute GI distress Musculoskeletal: Denies any acute onset joint swelling, redness, loss of ROM, or weakness Neurological: No reported episodes of acute onset apraxia, aphasia, dysarthria, agnosia, amnesia, paralysis, loss of coordination, or loss of consciousness  Allergies  [name] [name] is allergic to peanuts [peanut oil] and penicillins.  [place]  Drug: [name] [name]  reports no history of drug use. Alcohol:  reports no history of alcohol use. Tobacco:  reports that she has never smoked. She has never used smokeless tobacco. Medical:  has a past medical history of Hypertension, Neurofibromatosis ([place]), Renal disorder, Scoliosis of thoracic spine, and Thyroid disease. Surgical: [name] [name]  has a past surgical history that includes Cesarean section; chamberlain procedure; and Thyroidectomy. Family: family history includes Cancer in her mother.  Constitutional Exam  General appearance: Well nourished, well developed, and well hydrated. In no apparent acute distress Vitals:   08/12/19 1330  BP: (!) 139/95  Pulse: (!) 123  Resp: 16  Temp: (!) 97.3 F (36.3 C)  TempSrc: Temporal  SpO2: 100%  Weight: 149 lb (67.6 kg)  Height: 5' 1.5" (1.562 m)   BMI Assessment: Estimated body mass index is 27.7 kg/m as calculated from the following:   Height as of this encounter: 5' 1.5" (1.562 m).   Weight as of this encounter: 149 lb (67.6 kg).  BMI interpretation table: BMI level Category Range association with higher incidence of chronic pain  <18 kg/m2 Underweight   18.5-24.9 kg/m2 Ideal body weight   25-29.9 kg/m2 Overweight Increased incidence by 20%  30-34.9 kg/m2 Obese (Class I) Increased incidence by 68%  35-39.9 kg/m2 Severe obesity (Class II) Increased incidence by 136%  >40 kg/m2 Extreme obesity (Class III) Increased incidence by 254%   Patient's current BMI Ideal Body weight   Body mass index is 27.7 kg/m. Ideal body weight: 48.9 kg (107 lb 14.6 oz) Adjusted ideal body weight: 56.4 kg (124 lb 5.6 oz)   BMI Readings from Last 4 Encounters:  08/12/19 27.70 kg/m  04/16/19 28.15 kg/m  04/01/19 27.96 kg/m  01/17/19 28.34 kg/m   Wt Readings from Last 4 Encounters:  08/12/19 149 lb (67.6 kg)  04/16/19 149 lb (67.6 kg)  04/01/19 148 lb (67.1 kg)  01/17/19 150 lb (68 kg)    Psych/Mental status: Alert, oriented x 3 (person, place, & time)       Eyes: PERLA Respiratory: No evidence of acute respiratory distress  Cervical Spine Exam  Skin & Axial Inspection: No masses, redness, edema, swelling, or associated skin lesions Alignment: Symmetrical Functional ROM: Unrestricted ROM      Stability: No instability detected Muscle Tone/Strength: Functionally intact. No obvious neuro-muscular anomalies detected. Sensory (Neurological): Unimpaired Palpation: No palpable anomalies              Upper Extremity (UE) Exam    Side: Right upper extremity  Side: Left upper extremity  Skin & Extremity Inspection: Skin color, temperature, and hair growth are WNL. No peripheral edema or cyanosis. No masses, redness, swelling, asymmetry, or associated skin lesions. No contractures.  Skin & Extremity Inspection: Skin color, temperature, and hair growth are WNL. No peripheral edema or cyanosis. No  masses, redness, swelling, asymmetry, or associated skin lesions. No contractures.  Functional ROM: Unrestricted ROM          Functional ROM: Unrestricted ROM          Muscle Tone/Strength: Functionally intact. No obvious neuro-muscular anomalies detected.  Muscle Tone/Strength: Functionally intact. No obvious neuro-muscular anomalies detected.  Sensory (Neurological): Unimpaired          Sensory (Neurological): Unimpaired          Palpation: No palpable anomalies              Palpation: No palpable anomalies              Provocative Test(s):  Phalen's test: deferred Tinel's test:  deferred Apley's scratch test (touch opposite shoulder):  Action 1 (Across chest): deferred Action 2 (Overhead): deferred Action 3 (LB reach): deferred   Provocative Test(s):  Phalen's test: deferred Tinel's test: deferred Apley's scratch test (touch opposite shoulder):  Action 1 (Across chest): deferred Action 2 (Overhead): deferred Action 3 (LB reach): deferred    Thoracic Spine Area Exam  Skin & Axial Inspection: No masses, redness, or swelling Alignment: Symmetrical Functional ROM: Unrestricted ROM Stability: No instability detected Muscle Tone/Strength: Functionally intact. No obvious neuro-muscular anomalies detected. Sensory (Neurological): Neurogenic pain pattern Muscle strength & Tone: No palpable anomalies  Lumbar Exam  Skin & Axial Inspection: No masses, redness, or swelling Alignment: Symmetrical Functional ROM: Unrestricted ROM       Stability: No instability detected Muscle Tone/Strength: Functionally intact. No obvious neuro-muscular anomalies detected. Sensory (Neurological): Neurogenic pain pattern   Gait & Posture Assessment  Ambulation: Unassisted Gait: Relatively normal for age and body habitus Posture: WNL   Lower Extremity Exam    Side: Right lower extremity  Side: Left lower extremity  Stability: No instability observed          Stability: No instability observed          Skin & Extremity Inspection: Skin color, temperature, and hair growth are WNL. No peripheral edema or cyanosis. No masses, redness, swelling, asymmetry, or associated skin lesions. No contractures.  Skin & Extremity Inspection: Skin color, temperature, and hair growth are WNL. No peripheral edema or cyanosis. No masses, redness, swelling, asymmetry, or associated skin lesions. No contractures.  Functional ROM: Unrestricted ROM                  Functional ROM: Unrestricted ROM                  Muscle Tone/Strength: Functionally intact. No obvious neuro-muscular anomalies detected.   Muscle Tone/Strength: Functionally intact. No obvious neuro-muscular anomalies detected.  Sensory (Neurological): Neurogenic pain pattern        Sensory (Neurological): Neurogenic pain pattern        DTR: Patellar: deferred today Achilles: deferred today Plantar: deferred today  DTR: Patellar: deferred today Achilles: deferred today Plantar: deferred today  Palpation: No palpable anomalies  Palpation: No palpable anomalies   Assessment   Status Diagnosis  Controlled Controlled Controlled 1. Neurofibromatosis, type 1 (von Recklinghausen's disease) ([place])   2. Neuropathic pain   3. Chronic pain syndrome      1. Neurofibromatosis, type 1 (von Recklinghausen's disease) ([place]) - HYDROcodone-acetaminophen (NORCO) 7.5-325 MG tablet; Take 0.5-1 tablets by mouth daily as needed for severe pain. Must last 30 days.  Dispense: 30 tablet; Refill: 0 - HYDROcodone-acetaminophen (NORCO) 7.5-325 MG tablet; Take 0.5-1 tablets by mouth daily as needed for severe pain. Must last 30 days.  Dispense: 30 tablet; Refill: 0 - HYDROcodone-acetaminophen (NORCO) 7.5-325 MG tablet; Take 0.5-1 tablets by mouth daily as needed for severe pain. Must last 30 days.  Dispense: 30 tablet; Refill: 0  2. Neuropathic pain - HYDROcodone-acetaminophen (NORCO) 7.5-325 MG tablet; Take 0.5-1 tablets by mouth daily as needed for severe pain. Must last 30 days.  Dispense: 30 tablet; Refill: 0 - HYDROcodone-acetaminophen (NORCO) 7.5-325 MG tablet; Take 0.5-1 tablets by mouth daily as needed for severe pain. Must last 30 days.  Dispense: 30 tablet; Refill: 0 - HYDROcodone-acetaminophen (NORCO) 7.5-325 MG tablet; Take 0.5-1 tablets by mouth daily as needed for severe pain. Must last 30 days.  Dispense: 30 tablet; Refill: 0  3. Chronic pain syndrome - HYDROcodone-acetaminophen (NORCO) 7.5-325 MG tablet; Take 0.5-1 tablets by mouth daily as needed for severe pain. Must last 30 days.  Dispense: 30 tablet; Refill: 0 -  HYDROcodone-acetaminophen (NORCO) 7.5-325 MG tablet; Take 0.5-1 tablets by mouth daily as needed for severe pain. Must last 30 days.  Dispense: 30 tablet; Refill: 0 - HYDROcodone-acetaminophen (NORCO) 7.5-325 MG tablet; Take 0.5-1 tablets by mouth daily as needed for severe pain. Must last 30 days.  Dispense: 30 tablet; Refill: 0   Plan of Care  Pharmacotherapy (Medications Ordered): Meds ordered this encounter  Medications  . HYDROcodone-acetaminophen (NORCO) 7.5-325 MG tablet    Sig: Take 0.5-1 tablets by mouth daily as needed for severe pain. Must last 30 days.    Dispense:  30 tablet    Refill:  0    Chronic Pain. (STOP Act - Not applicable). Fill one day early if closed on scheduled refill date.  Marland Kitchen HYDROcodone-acetaminophen (NORCO) 7.5-325 MG tablet    Sig: Take 0.5-1 tablets by mouth daily as needed for severe pain. Must last 30 days.    Dispense:  30 tablet    Refill:  0    Chronic Pain. (STOP Act - Not applicable). Fill one day early if closed on scheduled refill date.  Marland Kitchen HYDROcodone-acetaminophen (NORCO) 7.5-325 MG tablet    Sig: Take 0.5-1 tablets by mouth daily as needed for severe pain. Must last 30 days.    Dispense:  30 tablet    Refill:  0    Chronic Pain. (STOP Act - Not applicable). Fill one day early if closed on scheduled refill date.    Planned follow-up:   Return in about 3 months (around 11/12/2019) for Medication Management, in person.    Primary Care Physician: Perrin Maltese, MD Location: [facility] Note by: Gillis Santa, MD Date: 08/12/2019; Time: 1:58 PM  Note: This dictation was prepared with Dragon dictation. Any transcriptional errors that may result from this process are unintentional.

## 2019-08-20 ENCOUNTER — Telehealth: Payer: Self-pay | Admitting: Student in an Organized Health Care Education/Training Program

## 2019-08-20 ENCOUNTER — Other Ambulatory Visit: Payer: Self-pay

## 2019-08-20 NOTE — Telephone Encounter (Signed)
I  called again, spoke to a different pharmacy staff member, they verified that the prescription for Hydrocodone for May is not at the pharmacy. Contacted patient, informed her that I will ask Dr. Dossie Arbour to prescribe.

## 2019-08-20 NOTE — Telephone Encounter (Signed)
I called [facility] in [place]. They confirmed they did not receive script dated 08-19-19. They did receive the ones dated 09-18-19 and 10-18-19.

## 2019-08-20 NOTE — Telephone Encounter (Signed)
Patient called pharmacy and was told they do not have script for May, only for June and July ( Hydrocodone) Please check and let patient know status.

## 2019-08-21 ENCOUNTER — Other Ambulatory Visit: Payer: Self-pay | Admitting: Pain Medicine

## 2019-08-21 DIAGNOSIS — M792 Neuralgia and neuritis, unspecified: Secondary | ICD-10-CM

## 2019-08-21 DIAGNOSIS — Q8501 Neurofibromatosis, type 1: Secondary | ICD-10-CM

## 2019-08-21 DIAGNOSIS — G894 Chronic pain syndrome: Secondary | ICD-10-CM

## 2019-08-21 MED ORDER — HYDROCODONE-ACETAMINOPHEN 7.5-325 MG PO TABS: 0.5000 | ORAL_TABLET | Freq: Every day | ORAL | 0 refills | Status: DC | PRN

## 2019-08-21 NOTE — Progress Notes (Unsigned)
Dr. Dossie Arbour covering for Dr. Gillis Santa during vacation.

## 2019-10-03 DIAGNOSIS — R519 Headache, unspecified: Secondary | ICD-10-CM | POA: Insufficient documentation

## 2019-10-03 DIAGNOSIS — M542 Cervicalgia: Secondary | ICD-10-CM | POA: Insufficient documentation

## 2019-11-07 ENCOUNTER — Encounter: Payer: Medicare PPO | Admitting: Student in an Organized Health Care Education/Training Program

## 2019-11-14 ENCOUNTER — Ambulatory Visit
Payer: Medicare PPO | Attending: Student in an Organized Health Care Education/Training Program | Admitting: Student in an Organized Health Care Education/Training Program

## 2019-11-14 ENCOUNTER — Other Ambulatory Visit: Payer: Self-pay

## 2019-11-14 ENCOUNTER — Encounter: Payer: Self-pay | Admitting: Student in an Organized Health Care Education/Training Program

## 2019-11-14 VITALS — BP 123/92 | HR 96 | Temp 97.9°F | Resp 16 | Ht 61.5 in | Wt 149.0 lb

## 2019-11-14 DIAGNOSIS — M4127 Other idiopathic scoliosis, lumbosacral region: Secondary | ICD-10-CM | POA: Insufficient documentation

## 2019-11-14 DIAGNOSIS — M545 Low back pain: Secondary | ICD-10-CM | POA: Diagnosis present

## 2019-11-14 DIAGNOSIS — G894 Chronic pain syndrome: Secondary | ICD-10-CM | POA: Diagnosis present

## 2019-11-14 DIAGNOSIS — Q8501 Neurofibromatosis, type 1: Secondary | ICD-10-CM | POA: Diagnosis present

## 2019-11-14 DIAGNOSIS — M792 Neuralgia and neuritis, unspecified: Secondary | ICD-10-CM | POA: Insufficient documentation

## 2019-11-14 DIAGNOSIS — M546 Pain in thoracic spine: Secondary | ICD-10-CM | POA: Diagnosis present

## 2019-11-14 DIAGNOSIS — Z0289 Encounter for other administrative examinations: Secondary | ICD-10-CM | POA: Diagnosis present

## 2019-11-14 MED ORDER — HYDROCODONE-ACETAMINOPHEN 7.5-325 MG PO TABS: 1.0000 | ORAL_TABLET | Freq: Every day | ORAL | 0 refills | Status: DC | PRN

## 2019-11-14 NOTE — Progress Notes (Signed)
PROVIDER NOTE: Information contained herein reflects review and annotations entered in association with encounter. Interpretation of such information and data should be left to medically-trained personnel. Information provided to patient can be located elsewhere in the medical record under "Patient Instructions". Document created using STT-dictation technology, any transcriptional errors that may result from process are unintentional.    Patient: [name] [name]  Service Category: E/M  Provider: Gillis Santa, MD  DOB: 03/18/1971  DOS: 11/14/2019  Specialty: Interventional Pain Management  MRN: [id number]  Setting: Ambulatory outpatient  PCP: Perrin Maltese, MD  Type: Established Patient    Referring Provider: Perrin Maltese, MD  Location: Office  Delivery: Face-to-face     HPI  Reason for encounter: [name] [name], a 49 y.o. 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 [name] [name] primary complain today is Back Pain (thoracic) Last encounter: Practice (08/20/2019). My last encounter with her was on 08/20/2019. Pertinent problems: [name] [name] has Back pain of thoracolumbar region; Neurofibromatosis, type 1 (von Recklinghausen's disease) ([place]); Idiopathic scoliosis; Chest wall pain, chronic; Thoracic spine pain; Neurofibroma; Neuropathic pain; Neurofibromatosis, type 1 ([place]); and Chronic pain syndrome on their pertinent problem list. Pain Assessment: Severity of Chronic pain is reported as a 7 /10. Location: Back Mid/sometimes radiates to base of skull. Onset: More than a month ago. Quality: Throbbing, Sharp. Timing: Constant. Modifying factor(s): meds, sitting. Vitals:  height is 5' 1.5" (1.562 m) and weight is 149 lb (67.6 kg). Her temporal temperature is 97.9 F (36.6 C). Her blood pressure is 123/92 (abnormal) and her pulse is 96. Her respiration is 16 and oxygen saturation is 100%.   No change in medical  history since last visit.  Patient's pain is at baseline.  Patient continues multimodal pain regimen as prescribed.  States that it provides pain relief and improvement in functional status. On occasion, patient does take 1.5 tablets total when she has increased pain.  In order to minimize her being without her medication, we will modify her monthly quantity from 30-45 so that she can take 1 to 1.5 tablets daily as needed for her pain syndrome.   Pharmacotherapy Assessment   10/21/2019  1   08/12/2019  Hydrocodone-Acetamin 7.5-325  30.00  30 Bi Lat   [id number]   [facility] ([id number])   0/0  7.50 MME  Medicare   [place]     Analgesic: Increase 1 tablet daily as needed to 1.5 tablets daily as needed, quantity 45/month  Monitoring: [place] PMP: PDMP reviewed during this encounter.       Pharmacotherapy: No side-effects or adverse reactions reported. Compliance: No problems identified. Effectiveness: Clinically acceptable.  [name], RN  11/14/2019  1:47 PM  Sign when Signing Visit Nursing Pain Medication Assessment:  Safety precautions to be maintained throughout the outpatient stay will include: orient to surroundings, keep bed in low position, maintain call bell within reach at all times, provide assistance with transfer out of bed and ambulation.  Medication Inspection Compliance: Pill count conducted under aseptic conditions, in front of the patient. Neither the pills nor the bottle was removed from the patient's sight at any time. Once count was completed pills were immediately returned to the patient in their original bottle.  Medication: Hydrocodone/APAP Pill/Patch Count: 6 of 30 pills remain Pill/Patch Appearance: Markings consistent with prescribed medication Bottle Appearance: Standard pharmacy container. Clearly labeled. Filled Date: 07 / 12 / 2021 Last Medication intake:  Yesterday  UDS:  Summary  Date Value Ref Range Status  04/03/2019 Note  Final    Comment:     ==================================================================== Compliance Drug Analysis, Ur ==================================================================== Test                             Result       Flag       Units Drug Present and Declared for Prescription Verification   Gabapentin                     PRESENT      EXPECTED   Baclofen                       PRESENT      EXPECTED   Nortriptyline                  PRESENT      EXPECTED    Nortriptyline may be administered as a prescription drug; it is also    an expected metabolite of amitriptyline.   Acetaminophen                  PRESENT      EXPECTED   Metoprolol                     PRESENT      EXPECTED Drug Present not Declared for Prescription Verification   Ibuprofen                      PRESENT      UNEXPECTED Drug Absent but Declared for Prescription Verification   Amitriptyline                  Not Detected UNEXPECTED    Amitriptyline is almost always present in patients taking this drug    consistently. Absence of amitriptyline could be due to lapse of time    since the last dose or unusual pharmacokinetics (rapid metabolism). ==================================================================== Test                      Result    Flag   Units      Ref Range   Creatinine              198              mg/dL      >=20 ==================================================================== Declared Medications:  The flagging and interpretation on this report are based on the  following declared medications.  Unexpected results may arise from  inaccuracies in the declared medications.  **Note: The testing scope of this panel includes these medications:  Amitriptyline  Baclofen  Gabapentin  Metoprolol  Nortriptyline  **Note: The testing scope of this panel does not include small to  moderate amounts of these reported medications:  Acetaminophen  **Note: The testing scope of this panel does not include the  following  reported medications:  Albuterol  Cephalexin  Meloxicam  Pantoprazole  Ranitidine  Simvastatin  Sulfamethoxazole  Sumatriptan  Trimethoprim ==================================================================== For clinical consultation, please call [phone]. ====================================================================      ROS  Constitutional: Denies any fever or chills Gastrointestinal: No reported hemesis, hematochezia, vomiting, or acute GI distress Musculoskeletal: Denies any acute onset joint swelling, redness, loss of ROM, or weakness Neurological: Neuropathic pain  Medication Review  HYDROcodone-acetaminophen, SUMAtriptan, acetaminophen, albuterol, amitriptyline, baclofen, gabapentin, ibuprofen, metoprolol tartrate,  nortriptyline, pantoprazole, and rosuvastatin  History Review  Allergy: [name] [name] is allergic to peanuts [peanut oil] and penicillins. Drug: [name] [name]  reports no history of drug use. Alcohol:  reports no history of alcohol use. Tobacco:  reports that she has never smoked. She has never used smokeless tobacco. Social: [name] [name]  reports that she has never smoked. She has never used smokeless tobacco. She reports that she does not drink alcohol and does not use drugs. Medical:  has a past medical history of Hypertension, Neurofibromatosis ([place]), Renal disorder, Scoliosis of thoracic spine, and Thyroid disease. Surgical: [name] [name]  has a past surgical history that includes Cesarean section; chamberlain procedure; and Thyroidectomy. Family: family history includes Cancer in her mother.  Laboratory Chemistry Profile   Renal Lab Results  Component Value Date   BUN 12 05/24/2018   CREATININE 0.65 05/24/2018   GFRAA >60 05/24/2018   GFRNONAA >60 05/24/2018     Hepatic Lab Results  Component Value Date   AST 25 05/24/2018   ALT 29 05/24/2018   ALBUMIN 3.8 05/24/2018   ALKPHOS 103 05/24/2018   LIPASE 28 05/24/2018     Electrolytes Lab  Results  Component Value Date   NA 139 05/24/2018   K 3.1 (L) 05/24/2018   CL 104 05/24/2018   CALCIUM 9.0 05/24/2018     Bone No results found for: VD25OH, VD125OH2TOT, ZE0923RA0, TM2263FH5, 25OHVITD1, 25OHVITD2, 25OHVITD3, TESTOFREE, TESTOSTERONE   Inflammation (CRP: Acute Phase) (ESR: Chronic Phase) No results found for: CRP, ESRSEDRATE, LATICACIDVEN     Note: Above Lab results reviewed.  Recent Imaging Review  MM 3D SCREEN BREAST BILATERAL CLINICAL DATA:  Screening.  EXAM: DIGITAL SCREENING BILATERAL MAMMOGRAM WITH TOMO AND CAD  COMPARISON:  Previous exam(s).  ACR Breast Density Category c: The breast tissue is heterogeneously dense, which may obscure small masses.  FINDINGS: There are no findings suspicious for malignancy. Images were processed with CAD.  IMPRESSION: No mammographic evidence of malignancy. A result letter of this screening mammogram will be mailed directly to the patient.  RECOMMENDATION: Screening mammogram in one year. (Code:SM-B-01Y)  BI-RADS CATEGORY  1: Negative.  Electronically Signed   By: Claudie Revering M.D.   On: 06/19/2019 14:42 Note: Reviewed        Physical Exam  General appearance: Well nourished, well developed, and well hydrated. In no apparent acute distress Mental status: Alert, oriented x 3 (person, place, & time)       Respiratory: No evidence of acute respiratory distress Eyes: PERLA Vitals: BP (!) 123/92   Pulse 96   Temp 97.9 F (36.6 C) (Temporal)   Resp 16   Ht 5' 1.5" (1.562 m)   Wt 149 lb (67.6 kg)   SpO2 100%   BMI 27.70 kg/m  BMI: Estimated body mass index is 27.7 kg/m as calculated from the following:   Height as of this encounter: 5' 1.5" (1.562 m).   Weight as of this encounter: 149 lb (67.6 kg). Ideal: Ideal body weight: 48.9 kg (107 lb 14.6 oz) Adjusted ideal body weight: 56.4 kg (124 lb 5.6 oz)  Lumbar Exam  Skin & Axial Inspection: No masses, redness, or swelling Alignment:  Symmetrical Functional ROM: Unrestricted ROM       Stability: No instability detected Muscle Tone/Strength: Functionally intact. No obvious neuro-muscular anomalies detected. Sensory (Neurological): Neurogenic pain pattern   Gait & Posture Assessment  Ambulation: Unassisted Gait: Relatively normal for age and body habitus Posture: WNL   Lower Extremity Exam  Side: Right lower extremity  Side: Left lower extremity  Stability: No instability observed          Stability: No instability observed          Skin & Extremity Inspection: Skin color, temperature, and hair growth are WNL. No peripheral edema or cyanosis. No masses, redness, swelling, asymmetry, or associated skin lesions. No contractures.  Skin & Extremity Inspection: Skin color, temperature, and hair growth are WNL. No peripheral edema or cyanosis. No masses, redness, swelling, asymmetry, or associated skin lesions. No contractures.  Functional ROM: Unrestricted ROM                  Functional ROM: Unrestricted ROM                  Muscle Tone/Strength: Functionally intact. No obvious neuro-muscular anomalies detected.  Muscle Tone/Strength: Functionally intact. No obvious neuro-muscular anomalies detected.  Sensory (Neurological): Neurogenic pain pattern        Sensory (Neurological): Neurogenic pain pattern        DTR: Patellar: deferred today Achilles: deferred today Plantar: deferred today  DTR: Patellar: deferred today Achilles: deferred today Plantar: deferred today  Palpation: No palpable anomalies  Palpation: No palpable anomalies     Assessment   Status Diagnosis  Controlled Controlled Controlled 1. Neurofibromatosis, type 1 (von Recklinghausen's disease) ([place])   2. Neuropathic pain   3. Other idiopathic scoliosis, lumbosacral region   4. Back pain of thoracolumbar region   5. Thoracic spine pain   6. Pain medication agreement signed   7. Chronic pain syndrome      Updated  Problems: Problem  Chronic Pain Syndrome  Neurofibroma  Back Pain of Thoracolumbar Region  Neurofibromatosis, Type 1 (Von Recklinghausen's Disease) ([facility])  Idiopathic Scoliosis  Chest Wall Pain, Chronic  Thoracic Spine Pain  Neuropathic Pain  Neurofibromatosis, Type 1 ([facility])    Plan of Care  [name] [name] has a current medication list which includes the following long-term medication(s): albuterol, amitriptyline, gabapentin, [START ON 11/20/2019] hydrocodone-acetaminophen, [START ON 12/20/2019] hydrocodone-acetaminophen, [START ON 01/19/2020] hydrocodone-acetaminophen, metoprolol tartrate, nortriptyline, pantoprazole, rosuvastatin, sumatriptan, hydrocodone-acetaminophen, and hydrocodone-acetaminophen.  Pharmacotherapy (Medications Ordered): Meds ordered this encounter  Medications  . HYDROcodone-acetaminophen ([place]) 7.5-325 MG tablet    Sig: Take 1-2 tablets by mouth daily as needed for severe pain. Must last 30 days.    Dispense:  45 tablet    Refill:  0    Chronic Pain. (STOP Act - Not applicable). Fill one day early if closed on scheduled refill date.  Marland Kitchen HYDROcodone-acetaminophen ([place]) 7.5-325 MG tablet    Sig: Take 1-2 tablets by mouth daily as needed for severe pain. Must last 30 days.    Dispense:  45 tablet    Refill:  0    Chronic Pain. (STOP Act - Not applicable). Fill one day early if closed on scheduled refill date.  Marland Kitchen HYDROcodone-acetaminophen ([place]) 7.5-325 MG tablet    Sig: Take 1-2 tablets by mouth daily as needed for severe pain. Must last 30 days.    Dispense:  45 tablet    Refill:  0    Chronic Pain. (STOP Act - Not applicable). Fill one day early if closed on scheduled refill date.    Follow-up plan:   Return if symptoms worsen or fail to improve.   Recent Visits No visits were found meeting these conditions. Showing recent visits within past 90 days and meeting all other requirements Today's Visits Date Type Provider Dept  11/14/19  Office Visit  Gillis Santa, MD [facility]  Showing today's visits and meeting all other requirements Future Appointments No visits were found meeting these conditions. Showing future appointments within next 90 days and meeting all other requirements  I discussed the assessment and treatment plan with the patient. The patient was provided an opportunity to ask questions and all were answered. The patient agreed with the plan and demonstrated an understanding of the instructions.  Patient advised to call back or seek an in-person evaluation if the symptoms or condition worsens.  Duration of encounter: 30 minutes.  Note by: Gillis Santa, MD Date: 11/14/2019; Time: 2:27 PM

## 2019-11-14 NOTE — Progress Notes (Signed)
Nursing Pain Medication Assessment:  Safety precautions to be maintained throughout the outpatient stay will include: orient to surroundings, keep bed in low position, maintain call bell within reach at all times, provide assistance with transfer out of bed and ambulation.  Medication Inspection Compliance: Pill count conducted under aseptic conditions, in front of the patient. Neither the pills nor the bottle was removed from the patient's sight at any time. Once count was completed pills were immediately returned to the patient in their original bottle.  Medication: Hydrocodone/APAP Pill/Patch Count: 6 of 30 pills remain Pill/Patch Appearance: Markings consistent with prescribed medication Bottle Appearance: Standard pharmacy container. Clearly labeled. Filled Date: 07 / 12 / 2021 Last Medication intake:  Yesterday

## 2019-12-09 ENCOUNTER — Ambulatory Visit: Payer: Medicare PPO | Attending: Internal Medicine

## 2019-12-09 DIAGNOSIS — Z23 Encounter for immunization: Secondary | ICD-10-CM

## 2019-12-09 NOTE — Progress Notes (Signed)
   Covid-19 Vaccination Clinic  Name:  [name] [name]    MRN: [id number] DOB: 1970-08-14  12/09/2019  [name] was observed post Covid-19 immunization for 15 minutes without incident. She was provided with Vaccine Information Sheet and instruction to access the V-Safe system.   [name] was instructed to call 911 with any severe reactions post vaccine: Marland Kitchen Difficulty breathing  . Swelling of face and throat  . A fast heartbeat  . A bad rash all over body  . Dizziness and weakness   Immunizations Administered    Name Date Dose VIS Date Route   Pfizer COVID-19 Vaccine 12/09/2019  9:34 AM 0.3 mL 06/05/2018 Intramuscular   Manufacturer: [place]   Lot: [id number]   [place]: [address]-4

## 2019-12-30 ENCOUNTER — Ambulatory Visit: Payer: Medicare PPO | Attending: Internal Medicine

## 2019-12-30 DIAGNOSIS — Z23 Encounter for immunization: Secondary | ICD-10-CM

## 2019-12-30 NOTE — Progress Notes (Signed)
   Covid-19 Vaccination Clinic  Name:  [name] [name]    MRN: [id number] DOB: 1970/05/09  12/30/2019  [name] was observed post Covid-19 immunization for 15 minutes without incident. She was provided with Vaccine Information Sheet and instruction to access the V-Safe system.   [name] was instructed to call 911 with any severe reactions post vaccine: Marland Kitchen Difficulty breathing  . Swelling of face and throat  . A fast heartbeat  . A bad rash all over body  . Dizziness and weakness   Immunizations Administered    Name Date Dose VIS Date Route   Pfizer COVID-19 Vaccine 12/30/2019  4:00 PM 0.3 mL 06/05/2018 Intramuscular   Manufacturer: [place]   Lot: [id number]   [place]: [address]-4

## 2020-02-01 ENCOUNTER — Emergency Department
Admission: EM | Admit: 2020-02-01 | Discharge: 2020-02-01 | Disposition: A | Discharge status: Home / Self Care | Payer: Medicare PPO | Attending: Emergency Medicine | Admitting: Emergency Medicine

## 2020-02-01 DIAGNOSIS — I10 Essential (primary) hypertension: Secondary | ICD-10-CM | POA: Insufficient documentation

## 2020-02-01 DIAGNOSIS — Z91010 Allergy to peanuts: Secondary | ICD-10-CM | POA: Diagnosis not present

## 2020-02-01 DIAGNOSIS — N390 Urinary tract infection, site not specified: Secondary | ICD-10-CM | POA: Diagnosis not present

## 2020-02-01 DIAGNOSIS — E039 Hypothyroidism, unspecified: Secondary | ICD-10-CM | POA: Diagnosis not present

## 2020-02-01 DIAGNOSIS — Z79899 Other long term (current) drug therapy: Secondary | ICD-10-CM | POA: Diagnosis not present

## 2020-02-01 LAB — URINALYSIS, COMPLETE (UACMP) WITH MICROSCOPIC
Bilirubin Urine: NEGATIVE
Glucose, UA: NEGATIVE mg/dL
Hgb urine dipstick: NEGATIVE
Ketones, ur: 5 mg/dL — AB
Nitrite: NEGATIVE
Protein, ur: NEGATIVE mg/dL
Specific Gravity, Urine: 1.021 (ref 1.005–1.030)
pH: 5 (ref 5.0–8.0)

## 2020-02-01 MED ORDER — CEFTRIAXONE SODIUM 1 G IJ SOLR
1.0000 g | Freq: Once | INTRAMUSCULAR | Status: AC
  Administered 2020-02-01: 1 g via INTRAMUSCULAR
  Filled 2020-02-01: qty 10

## 2020-02-01 MED ORDER — CEPHALEXIN 500 MG PO CAPS: 500.0000 mg | ORAL_CAPSULE | Freq: Three times a day (TID) | ORAL | 0 refills | Status: AC

## 2020-02-01 NOTE — ED Triage Notes (Signed)
UTI being treated with three rounds of antibiotics. Still remains without any relief. Continuing to get worse. Today is complaining of bilateral flank pain right greater than left.

## 2020-02-01 NOTE — ED Provider Notes (Signed)
[facility] Emergency Department Provider Note   ____________________________________________    I have reviewed the triage vital signs and the nursing notes.   HISTORY  Chief Complaint Recurrent UTI     HPI [name] [name] is a 49 y.o. female with a history of neurofibromatosis, hypertension who presents with complaints of recurrent UTI.  Patient reports she was last on antibiotics 3 weeks ago and had been doing better although now is having the same symptoms that she always has when she has UTI which she describes as some cramping in her lower abdomen and urinary frequency.  Denies fevers or chills.  No nausea or vomiting  Past Medical History:  Diagnosis Date  . Hypertension    controlled  . 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 Renal disorder   . Scoliosis of thoracic spine    "C" curve, convex to left  . Thyroid disease    controlled    Patient Active Problem List   Diagnosis Date Noted  . Chronic pain syndrome 06/12/2019  . Neurofibroma 02/05/2019  . Back pain of thoracolumbar region 02/18/2015  . 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 Idiopathic scoliosis 02/18/2015  . Chest wall pain, chronic 12/25/2013  . Thoracic spine pain 07/26/2013  . Neuropathic pain 07/26/2013  . Neurofibromatosis, type 1 ([place]) 07/26/2013    Past Surgical History:  Procedure Laterality Date  . CESAREAN SECTION    . chamberlain procedure    . THYROIDECTOMY      Prior to Admission medications   Medication Sig Start Date End Date Taking? Authorizing Provider  acetaminophen (TYLENOL) 500 MG tablet Take 500 mg by mouth every 8 (eight) hours as needed. 3 tabs    [provider]  albuterol (PROVENTIL HFA;VENTOLIN HFA) 108 (90 BASE) MCG/ACT inhaler Inhale into the lungs every 6 (six) hours as needed for wheezing or shortness of breath.    [provider]  amitriptyline  (ELAVIL) 10 MG tablet Take 10 mg by mouth at bedtime.    [provider]  baclofen (LIORESAL) 10 MG tablet Take 10 mg by mouth 2 (two) times daily.    [provider]  cephALEXin (KEFLEX) 500 MG capsule Take 1 capsule (500 mg total) by mouth 3 (three) times daily for 7 days. 02/01/20 02/08/20  Lavonia Drafts, MD  gabapentin (NEURONTIN) 600 MG tablet Take 600 mg by mouth 4 (four) times daily.     [provider]  HYDROcodone-acetaminophen (NORCO) 7.5-325 MG tablet Take 0.5-1 tablets by mouth daily as needed for severe pain. Must last 30 days. 09/18/19 10/18/19  Gillis Santa, MD  HYDROcodone-acetaminophen (NORCO) 7.5-325 MG tablet Take 0.5-1 tablets by mouth daily as needed for severe pain. Must last 30 days. 08/21/19 09/20/19  Milinda Pointer, MD  HYDROcodone-acetaminophen ([place]) 7.5-325 MG tablet Take 1-2 tablets by mouth daily as needed for severe pain. Must last 30 days. 11/20/19 12/20/19  Gillis Santa, MD  HYDROcodone-acetaminophen (NORCO) 7.5-325 MG tablet Take 1-2 tablets by mouth daily as needed for severe pain. Must last 30 days. 12/20/19 01/19/20  Gillis Santa, MD  HYDROcodone-acetaminophen (NORCO) 7.5-325 MG tablet Take 1-2 tablets by mouth daily as needed for severe pain. Must last 30 days. 01/19/20 02/18/20  Gillis Santa, MD  ibuprofen (ADVIL) 600 MG tablet Take 1 tablet (600 mg total) by mouth every 8 (eight) hours as needed. 04/16/19   Sable Feil, PA-C  metoprolol (LOPRESSOR) 100 MG tablet Take  100 mg by mouth daily.    [provider]  nortriptyline (PAMELOR) 10 MG capsule Take 10 mg by mouth 2 (two) times daily.    [provider]  pantoprazole (PROTONIX) 40 MG tablet Take 40 mg by mouth daily.    [provider]  rosuvastatin (CRESTOR) 40 MG tablet Take 40 mg by mouth daily. 08/07/19   [provider]  SUMAtriptan (IMITREX) 100 MG tablet Take 100 mg by mouth every 2 (two) hours as needed for migraine. May repeat in 2 hours  if headache persists or recurs.    [provider]     Allergies Peanuts [peanut oil] and Penicillins  Family History  Problem Relation Age of Onset  . Cancer Mother   . Breast cancer Neg Hx     Social History Social History   Tobacco Use  . Smoking status: Never Smoker  . Smokeless tobacco: Never Used  . Tobacco comment: this patient is a nonsmoker   Substance Use Topics  . Alcohol use: No    Alcohol/week: 0.0 standard drinks  . Drug use: No    Review of Systems  Constitutional: No fever/chills Eyes: No visual changes.  ENT: No sore throat. Cardiovascular: Denies chest pain. Respiratory: Denies shortness of breath. Gastrointestinal: As above Genitourinary: Negative for dysuria.  Positive for frequency Musculoskeletal: Negative for back pain. Skin: Negative for rash. Neurological: Negative for headaches or weakness   ____________________________________________   PHYSICAL EXAM:  VITAL SIGNS: ED Triage Vitals  Enc Vitals Group     BP 02/01/20 1014 (!) 126/97     Pulse Rate 02/01/20 1014 72     Resp 02/01/20 1014 16     Temp 02/01/20 1014 97.9 F (36.6 C)     Temp Source 02/01/20 1014 Oral     SpO2 02/01/20 1014 100 %     Weight 02/01/20 1018 67.1 kg (148 lb)     Height 02/01/20 1018 1.549 m (5\' 1" )     Head Circumference --      Peak Flow --      Pain Score 02/01/20 1018 8     Pain Loc --      Pain Edu? --      Excl. in [place]? --     Constitutional: Alert and oriented.  Nose: No congestion/rhinnorhea. Mouth/Throat: Mucous membranes are moist.    Cardiovascular: Normal rate, regular rhythm.  Good peripheral circulation. Respiratory: Normal respiratory effort.  No retractions.  Gastrointestinal: Soft and nontender. No distention.  No CVA tenderness.  Reassuring exam  Musculoskeletal: No lower extremity tenderness nor edema.  Warm and well perfused Neurologic:  Normal speech and language. No gross focal neurologic deficits are appreciated.   Skin:  Skin is warm, dry and intact. No rash noted. Psychiatric: Mood and affect are normal. Speech and behavior are normal.  ____________________________________________   LABS (all labs ordered are listed, but only abnormal results are displayed)  Labs Reviewed  URINALYSIS, COMPLETE (UACMP) WITH MICROSCOPIC - Abnormal; Notable for the following components:      Result Value   Color, Urine YELLOW (*)    APPearance CLOUDY (*)    Ketones, ur 5 (*)    Leukocytes,Ua LARGE (*)    Bacteria, UA RARE (*)    Crystals PRESENT (*)    All other components within normal limits  URINE CULTURE   ____________________________________________  EKG  None ____________________________________________  RADIOLOGY  None ____________________________________________   PROCEDURES  Procedure(s) performed: No  Procedures   Critical  Care performed: No ____________________________________________   INITIAL IMPRESSION / ASSESSMENT AND PLAN / ED COURSE  Pertinent labs & imaging results that were available during my care of the patient were reviewed by me and considered in my medical decision making (see chart for details).  Patient presents with symptoms that she says are consistent with a urinary tract infection for her.  Indeed her urinalysis is suspicious for UTI although 11-20 squamous epithelial cells.  However given her symptoms we will start her on Keflex p.o. 3 times daily after IM Rocephin injection.  Penicillin allergy noted however it is mild and very little cross-reactivity with cephalosporins    ____________________________________________   FINAL CLINICAL IMPRESSION(S) / ED DIAGNOSES  Final diagnoses:  Lower urinary tract infection        Note:  This document was prepared using Dragon voice recognition software and may include unintentional dictation errors.   Lavonia Drafts, MD 02/01/20 1148

## 2020-02-02 LAB — URINE CULTURE: Culture: NO GROWTH

## 2020-02-12 DIAGNOSIS — R200 Anesthesia of skin: Secondary | ICD-10-CM | POA: Insufficient documentation

## 2020-02-12 HISTORY — DX: Anesthesia of skin: R20.0

## 2020-02-13 ENCOUNTER — Encounter: Payer: Self-pay | Admitting: Student in an Organized Health Care Education/Training Program

## 2020-02-13 ENCOUNTER — Other Ambulatory Visit: Payer: Self-pay

## 2020-02-13 ENCOUNTER — Ambulatory Visit
Payer: Medicare PPO | Attending: Student in an Organized Health Care Education/Training Program | Admitting: Student in an Organized Health Care Education/Training Program

## 2020-02-13 VITALS — BP 137/99 | HR 126 | Temp 97.2°F | Resp 18 | Ht 61.5 in | Wt 149.0 lb

## 2020-02-13 DIAGNOSIS — Z0289 Encounter for other administrative examinations: Secondary | ICD-10-CM | POA: Diagnosis present

## 2020-02-13 DIAGNOSIS — M545 Low back pain, unspecified: Secondary | ICD-10-CM | POA: Insufficient documentation

## 2020-02-13 DIAGNOSIS — M546 Pain in thoracic spine: Secondary | ICD-10-CM | POA: Diagnosis present

## 2020-02-13 DIAGNOSIS — G8929 Other chronic pain: Secondary | ICD-10-CM | POA: Insufficient documentation

## 2020-02-13 DIAGNOSIS — Q8501 Neurofibromatosis, type 1: Secondary | ICD-10-CM | POA: Diagnosis not present

## 2020-02-13 DIAGNOSIS — M792 Neuralgia and neuritis, unspecified: Secondary | ICD-10-CM | POA: Diagnosis not present

## 2020-02-13 DIAGNOSIS — G894 Chronic pain syndrome: Secondary | ICD-10-CM | POA: Diagnosis present

## 2020-02-13 DIAGNOSIS — M4127 Other idiopathic scoliosis, lumbosacral region: Secondary | ICD-10-CM

## 2020-02-13 DIAGNOSIS — R0789 Other chest pain: Secondary | ICD-10-CM | POA: Insufficient documentation

## 2020-02-13 MED ORDER — HYDROCODONE-ACETAMINOPHEN 7.5-325 MG PO TABS: 1.0000 | ORAL_TABLET | Freq: Every day | ORAL | 0 refills | Status: DC | PRN

## 2020-02-13 NOTE — Progress Notes (Signed)
Nursing Pain Medication Assessment:  Safety precautions to be maintained throughout the outpatient stay will include: orient to surroundings, keep bed in low position, maintain call bell within reach at all times, provide assistance with transfer out of bed and ambulation.  Medication Inspection Compliance: Pill count conducted under aseptic conditions, in front of the patient. Neither the pills nor the bottle was removed from the patient's sight at any time. Once count was completed pills were immediately returned to the patient in their original bottle.  Medication: Hydrocodone/APAP Pill/Patch Count: 2.5 of 45 pills remain Pill/Patch Appearance: Markings consistent with prescribed medication Bottle Appearance: Standard pharmacy container. Clearly labeled. Filled Date: 10 / 10 / 2021 Last Medication intake:  Today

## 2020-02-13 NOTE — Progress Notes (Signed)
PROVIDER NOTE: Information contained herein reflects review and annotations entered in association with encounter. Interpretation of such information and data should be left to medically-trained personnel. Information provided to patient can be located elsewhere in the medical record under "Patient Instructions". Document created using STT-dictation technology, any transcriptional errors that may result from process are unintentional.    Patient: [name] [name]  Service Category: E/M  Provider: Gillis Santa, MD  DOB: 1970/12/13  DOS: 02/13/2020  Specialty: Interventional Pain Management  MRN: [id number]  Setting: Ambulatory outpatient  PCP: Perrin Maltese, MD  Type: Established Patient    Referring Provider: Perrin Maltese, MD  Location: Office  Delivery: Face-to-face     HPI  Ms. CHERIL SLATTERY, a 49 y.o. 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 Ms. Oesterle's primary complain today is Back Pain (low on the right, mid back bilaterally) Last encounter: My last encounter with her was on 11/14/2019. Pertinent problems: Ms. 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 Pain Assessment: Severity of Chronic pain is reported as a 8 /10. Location: Back Mid/low. Onset: More than a month ago. Quality: Constant, Sharp, Burning. Timing: Intermittent. Modifying factor(s): medications, rest. Vitals:  height is 5' 1.5" (1.562 m) and weight is 149 lb (67.6 kg). Her temporal temperature is 97.2 F (36.2 C) (abnormal). Her blood pressure is 137/99 (abnormal) and her pulse is 126 (abnormal). Her respiration is 18 and oxygen saturation is 100%.   Reason for encounter: medication management.   No  change in medical history since last visit.  Patient's pain is at baseline.  Patient continues multimodal pain regimen as prescribed.  States that it provides pain relief and improvement in functional status. Continues gabapentin 600 mg 4 times daily, nortriptyline 30 mg nightly as prescribed by neurology.  Pharmacotherapy Assessment   01/19/2020  11/14/2019   1  Hydrocodone-Acetamin 7.5-325 45.00  22  [place]  [id number]  [facility] ([id number])  0/0  15.34 MME  Medicare  [place]      Analgesic: Hydrocodone 7.5 mg twice daily as needed, quantity 45/month; MME equals 15    Monitoring: [place] PMP: PDMP reviewed during this encounter.       Pharmacotherapy: No side-effects or adverse reactions reported. Compliance: No problems identified. Effectiveness: Clinically acceptable.  Hart Rochester, RN  02/13/2020  1:46 PM  Sign when Signing Visit Nursing Pain Medication Assessment:  Safety precautions to be maintained throughout the outpatient stay will include: orient to surroundings, keep bed in low position, maintain call bell within reach at all times, provide assistance with transfer out of bed and ambulation.  Medication Inspection Compliance: Pill count conducted under aseptic conditions, in front of the patient. Neither the pills nor the bottle was removed from the patient's sight at any time. Once count was completed pills were immediately returned to the patient in their original bottle.  Medication: Hydrocodone/APAP Pill/Patch Count: 2.5 of 45 pills remain Pill/Patch Appearance: Markings consistent with prescribed medication Bottle Appearance: Standard pharmacy container. Clearly labeled. Filled Date: 10 / 10 / 2021 Last Medication intake:  Today    UDS:  Summary  Date Value Ref Range Status  04/03/2019 Note  Final    Comment:    ==================================================================== Compliance Drug Analysis, Ur ==================================================================== Test  Result       Flag       Units Drug Present and Declared for Prescription Verification   Gabapentin                     PRESENT      EXPECTED   Baclofen                       PRESENT      EXPECTED   Nortriptyline                  PRESENT      EXPECTED    Nortriptyline may be administered as a prescription drug; it is also    an expected metabolite of amitriptyline.   Acetaminophen                  PRESENT      EXPECTED   Metoprolol                     PRESENT      EXPECTED Drug Present not Declared for Prescription Verification   Ibuprofen                      PRESENT      UNEXPECTED Drug Absent but Declared for Prescription Verification   Amitriptyline                  Not Detected UNEXPECTED    Amitriptyline is almost always present in patients taking this drug    consistently. Absence of amitriptyline could be due to lapse of time    since the last dose or unusual pharmacokinetics (rapid metabolism). ==================================================================== Test                      Result    Flag   Units      Ref Range   Creatinine              198              mg/dL      >=20 ==================================================================== Declared Medications:  The flagging and interpretation on this report are based on the  following declared medications.  Unexpected results may arise from  inaccuracies in the declared medications.  **Note: The testing scope of this panel includes these medications:  Amitriptyline  Baclofen  Gabapentin  Metoprolol  Nortriptyline  **Note: The testing scope of this panel does not include small to  moderate amounts of these reported medications:  Acetaminophen  **Note: The testing scope of this panel does not include the  following reported medications:  Albuterol  Cephalexin  Meloxicam  Pantoprazole  Ranitidine  Simvastatin  Sulfamethoxazole  Sumatriptan   Trimethoprim ==================================================================== For clinical consultation, please call [phone]. ====================================================================      ROS  Constitutional: Denies any fever or chills Gastrointestinal: No reported hemesis, hematochezia, vomiting, or acute GI distress Musculoskeletal: Denies any acute onset joint swelling, redness, loss of ROM, or weakness Neurological: No reported episodes of acute onset apraxia, aphasia, dysarthria, agnosia, amnesia, paralysis, loss of coordination, or loss of consciousness  Medication Review  HYDROcodone-acetaminophen, SUMAtriptan, acetaminophen, albuterol, amitriptyline, baclofen, gabapentin, ibuprofen, metoprolol tartrate, nortriptyline, pantoprazole, and rosuvastatin  History Review  Allergy: Ms. Coaxum is allergic to peanuts [peanut oil] and penicillins. Drug: Ms. Posada  reports no history of drug use. Alcohol:  reports no history of alcohol use. Tobacco:  reports  that she has never smoked. She has never used smokeless tobacco. Social: Ms. Tancredi  reports that she has never smoked. She has never used smokeless tobacco. She reports that she does not drink alcohol and does not use drugs. Medical:  has a past medical history of Hypertension, Neurofibromatosis ([place]), Renal disorder, Scoliosis of thoracic spine, and Thyroid disease. Surgical: Ms. Coover  has a past surgical history that includes Cesarean section; chamberlain procedure; and Thyroidectomy. Family: family history includes Cancer in her mother.  Laboratory Chemistry Profile   Renal Lab Results  Component Value Date   BUN 12 05/24/2018   CREATININE 0.65 05/24/2018   GFRAA >60 05/24/2018   GFRNONAA >60 05/24/2018     Hepatic Lab Results  Component Value Date   AST 25 05/24/2018   ALT 29 05/24/2018   ALBUMIN 3.8 05/24/2018   ALKPHOS 103 05/24/2018   LIPASE 28 05/24/2018     Electrolytes Lab Results   Component Value Date   NA 139 05/24/2018   K 3.1 (L) 05/24/2018   CL 104 05/24/2018   CALCIUM 9.0 05/24/2018     Bone No results found for: VD25OH, VD125OH2TOT, ZR0076AU6, [id number], 25OHVITD1, 25OHVITD2, 25OHVITD3, TESTOFREE, TESTOSTERONE   Inflammation (CRP: Acute Phase) (ESR: Chronic Phase) No results found for: CRP, ESRSEDRATE, LATICACIDVEN     Note: Above Lab results reviewed.  Recent Imaging Review  MM 3D SCREEN BREAST BILATERAL CLINICAL DATA:  Screening.  EXAM: DIGITAL SCREENING BILATERAL MAMMOGRAM WITH TOMO AND CAD  COMPARISON:  Previous exam(s).  ACR Breast Density Category c: The breast tissue is heterogeneously dense, which may obscure small masses.  FINDINGS: There are no findings suspicious for malignancy. Images were processed with CAD.  IMPRESSION: No mammographic evidence of malignancy. A result letter of this screening mammogram will be mailed directly to the patient.  RECOMMENDATION: Screening mammogram in one year. (Code:SM-B-01Y)  BI-RADS CATEGORY  1: Negative.  Electronically Signed   By: Claudie Revering M.D.   On: 06/19/2019 14:42 Note: Reviewed        Physical Exam  General appearance: Well nourished, well developed, and well hydrated. In no apparent acute distress Mental status: Alert, oriented x 3 (person, place, & time)       Respiratory: No evidence of acute respiratory distress Eyes: PERLA Vitals: BP (!) 137/99   Pulse (!) 126   Temp (!) 97.2 F (36.2 C) (Temporal)   Resp 18   Ht 5' 1.5" (1.562 m)   Wt 149 lb (67.6 kg)   SpO2 100%   BMI 27.70 kg/m  BMI: Estimated body mass index is 27.7 kg/m as calculated from the following:   Height as of this encounter: 5' 1.5" (1.562 m).   Weight as of this encounter: 149 lb (67.6 kg). Ideal: Ideal body weight: 48.9 kg (107 lb 14.6 oz) Adjusted ideal body weight: 56.4 kg (124 lb 5.6 oz)  Lumbar Exam  Skin & Axial Inspection:No masses, redness, or  swelling Alignment:Symmetrical Functional WLS:[id number] ROM Stability:No instability detected Muscle Tone/Strength:Functionally intact. No obvious neuro-muscular anomalies detected. Sensory (Neurological):Neurogenic pain pattern   Gait & Posture Assessment  Ambulation:Unassisted Gait:Relatively normal for age and body habitus Posture:WNL  Lower Extremity Exam    Side:Right lower extremity  Side:Left lower extremity  Stability:No instability observed  Stability:No instability observed  Skin & Extremity Inspection:Skin color, temperature, and hair growth are WNL. No peripheral edema or cyanosis. No masses, redness, swelling, asymmetry, or associated skin lesions. No contractures.  Skin & Extremity Inspection:Skin color, temperature, and hair growth  are WNL. No peripheral edema or cyanosis. No masses, redness, swelling, asymmetry, or associated skin lesions. No contractures.  Functional YDS:[id number] ROM   Functional BIP:[id number] ROM   Muscle Tone/Strength:Functionally intact. No obvious neuro-muscular anomalies detected.  Muscle Tone/Strength:Functionally intact. No obvious neuro-muscular anomalies detected.  Sensory (Neurological):Neurogenic pain pattern  Sensory (Neurological):Neurogenic pain pattern  DTR: Patellar:deferred today Achilles:deferred today Plantar:deferred today  DTR: Patellar:deferred today Achilles:deferred today Plantar:deferred today  Palpation:No palpable anomalies  Palpation:No palpable anomalies     Assessment   Status Diagnosis  Controlled Controlled Controlled 1. 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 Neuropathic pain   3. Other idiopathic scoliosis, lumbosacral region   4. Back pain of thoracolumbar region   5. Thoracic spine pain   6. Pain medication agreement signed   7. Chest wall pain, chronic   8.  Chronic pain syndrome      Plan of Care  Ms. Giani L Zorn has a current medication list which includes the following long-term medication(s): albuterol, amitriptyline, gabapentin, metoprolol tartrate, nortriptyline, pantoprazole, rosuvastatin, sumatriptan, [START ON 02/18/2020] hydrocodone-acetaminophen, [START ON 03/19/2020] hydrocodone-acetaminophen, and [START ON 04/18/2020] hydrocodone-acetaminophen.  Pharmacotherapy (Medications Ordered): Meds ordered this encounter  Medications  . HYDROcodone-acetaminophen (NORCO) 7.5-325 MG tablet    Sig: Take 1-2 tablets by mouth daily as needed for severe pain. Must last 30 days.    Dispense:  45 tablet    Refill:  0    Chronic Pain. (STOP Act - Not applicable). Fill one day early if closed on scheduled refill date.  Marland Kitchen HYDROcodone-acetaminophen (NORCO) 7.5-325 MG tablet    Sig: Take 1-2 tablets by mouth daily as needed for severe pain. Must last 30 days.    Dispense:  45 tablet    Refill:  0    Chronic Pain. (STOP Act - Not applicable). Fill one day early if closed on scheduled refill date.  Marland Kitchen HYDROcodone-acetaminophen (NORCO) 7.5-325 MG tablet    Sig: Take 1-2 tablets by mouth daily as needed for severe pain. Must last 30 days.    Dispense:  45 tablet    Refill:  0    Chronic Pain. (STOP Act - Not applicable). Fill one day early if closed on scheduled refill date.   Continue gabapentin and nortriptyline as prescribed by neurology.  Continue with physical activity and exercise.  Follow-up plan:   Return in about 3 months (around 05/15/2020) for Medication Management, in person.   Recent Visits No visits were found meeting these conditions. Showing recent visits within past 90 days and meeting all other requirements Today's Visits Date Type Provider Dept  02/13/20 Office Visit Gillis Santa, MD [facility]  Showing today's visits and meeting all other requirements Future Appointments No visits were found meeting these  conditions. Showing future appointments within next 90 days and meeting all other requirements  I discussed the assessment and treatment plan with the patient. The patient was provided an opportunity to ask questions and all were answered. The patient agreed with the plan and demonstrated an understanding of the instructions.  Patient advised to call back or seek an in-person evaluation if the symptoms or condition worsens.  Duration of encounter: 8mnutes.  Note by: BGillis Santa MD Date: 02/13/2020; Time: 1:59 PM

## 2020-02-20 ENCOUNTER — Other Ambulatory Visit: Payer: Self-pay | Admitting: Nephrology

## 2020-02-20 DIAGNOSIS — N200 Calculus of kidney: Secondary | ICD-10-CM

## 2020-02-20 DIAGNOSIS — N1831 Chronic kidney disease, stage 3a: Secondary | ICD-10-CM

## 2020-02-20 DIAGNOSIS — I10 Essential (primary) hypertension: Secondary | ICD-10-CM

## 2020-02-20 DIAGNOSIS — R809 Proteinuria, unspecified: Secondary | ICD-10-CM

## 2020-02-26 ENCOUNTER — Other Ambulatory Visit: Payer: Self-pay

## 2020-02-26 ENCOUNTER — Ambulatory Visit
Admission: RE | Admit: 2020-02-26 | Discharge: 2020-02-26 | Disposition: A | Discharge status: Home / Self Care | Payer: Medicare PPO | Source: Ambulatory Visit | Attending: Nephrology | Admitting: Nephrology

## 2020-02-26 DIAGNOSIS — R809 Proteinuria, unspecified: Secondary | ICD-10-CM | POA: Insufficient documentation

## 2020-02-26 DIAGNOSIS — N1831 Chronic kidney disease, stage 3a: Secondary | ICD-10-CM

## 2020-02-26 DIAGNOSIS — I10 Essential (primary) hypertension: Secondary | ICD-10-CM | POA: Diagnosis present

## 2020-02-26 DIAGNOSIS — N200 Calculus of kidney: Secondary | ICD-10-CM

## 2020-04-15 DIAGNOSIS — N200 Calculus of kidney: Secondary | ICD-10-CM | POA: Insufficient documentation

## 2020-05-14 ENCOUNTER — Other Ambulatory Visit: Payer: Self-pay

## 2020-05-14 ENCOUNTER — Encounter: Payer: Self-pay | Admitting: Student in an Organized Health Care Education/Training Program

## 2020-05-14 ENCOUNTER — Ambulatory Visit
Payer: Medicare PPO | Attending: Student in an Organized Health Care Education/Training Program | Admitting: Student in an Organized Health Care Education/Training Program

## 2020-05-14 VITALS — BP 123/88 | HR 125 | Temp 97.4°F | Resp 18 | Ht 61.5 in | Wt 145.0 lb

## 2020-05-14 DIAGNOSIS — G894 Chronic pain syndrome: Secondary | ICD-10-CM | POA: Insufficient documentation

## 2020-05-14 DIAGNOSIS — M792 Neuralgia and neuritis, unspecified: Secondary | ICD-10-CM | POA: Insufficient documentation

## 2020-05-14 DIAGNOSIS — Q8501 Neurofibromatosis, type 1: Secondary | ICD-10-CM | POA: Insufficient documentation

## 2020-05-14 MED ORDER — HYDROCODONE-ACETAMINOPHEN 7.5-325 MG PO TABS: 1.0000 | ORAL_TABLET | Freq: Every day | ORAL | 0 refills | Status: DC | PRN

## 2020-05-14 NOTE — Progress Notes (Signed)
Nursing Pain Medication Assessment:  Safety precautions to be maintained throughout the outpatient stay will include: orient to surroundings, keep bed in low position, maintain call bell within reach at all times, provide assistance with transfer out of bed and ambulation.  Medication Inspection Compliance: Pill count conducted under aseptic conditions, in front of the patient. Neither the pills nor the bottle was removed from the patient's sight at any time. Once count was completed pills were immediately returned to the patient in their original bottle.  Medication: Hydrocodone/APAP Pill/Patch Count: 15 of 45 pills remain Pill/Patch Appearance: Markings consistent with prescribed medication Bottle Appearance: Standard pharmacy container. Clearly labeled. Filled Date: 1 / 10 / 22 Last Medication intake:  Yesterday

## 2020-05-14 NOTE — Patient Instructions (Signed)
Hydrocodone to last until 08/19/20 has been escribed to your pharmacy.

## 2020-05-14 NOTE — Progress Notes (Signed)
PROVIDER NOTE: Information contained herein reflects review and annotations entered in association with encounter. Interpretation of such information and data should be left to medically-trained personnel. Information provided to patient can be located elsewhere in the medical record under "Patient Instructions". Document created using STT-dictation technology, any transcriptional errors that may result from process are unintentional.    Patient: [name] [name]  Service Category: E/M  Provider: Gillis Santa, MD  DOB: June 30, 1970  DOS: 05/14/2020  Specialty: Interventional Pain Management  MRN: [id number]  Setting: Ambulatory outpatient  PCP: Perrin Maltese, MD  Type: Established Patient    Referring Provider: Perrin Maltese, MD  Location: Office  Delivery: Face-to-face     HPI  [name], a 50 y.o. 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 [name]'s primary complain today is Back Pain (mid), Chest Pain, and Hip Pain (right) Last encounter: My last encounter with her was on 02/13/2020. Pertinent problems: [name] has Back pain of thoracolumbar region; Neurofibromatosis, type 1 (von Recklinghausen's disease) ([place]); Idiopathic scoliosis; Chest wall pain, chronic; Thoracic spine pain; Neurofibroma; Neuropathic pain; Neurofibromatosis, type 1 ([place]); and Chronic pain syndrome on their pertinent problem list. Pain Assessment: Severity of Chronic pain is reported as a 5 /10. Location: Back Mid/sometimes radiates to chest. Onset: More than a month ago. Quality: Throbbing,Sharp,Stabbing. Timing: Intermittent. Modifying factor(s): meds. Vitals:  height is 5' 1.5" (1.562 m) and weight is 145 lb (65.8 kg). Her temporal temperature is 97.4 F (36.3 C) (abnormal). Her blood pressure is 123/88 and her pulse is 125 (abnormal). Her respiration is 18 and oxygen saturation is 100%.   Reason for encounter: medication management.    No  change in medical history since last visit.  Patient's pain is at baseline.  Patient continues multimodal pain regimen as prescribed.  States that it provides pain relief and improvement in functional status.  Pharmacotherapy Assessment   Analgesic: Hydrocodone 7.5 mg twice daily as needed, quantity 45/month; MME equals 15    Monitoring: [place] PMP: PDMP reviewed during this encounter.       Pharmacotherapy: No side-effects or adverse reactions reported. Compliance: No problems identified. Effectiveness: Clinically acceptable.  [name] Patience, RN  05/14/2020 11:52 AM  Sign when Signing Visit Nursing Pain Medication Assessment:  Safety precautions to be maintained throughout the outpatient stay will include: orient to surroundings, keep bed in low position, maintain call bell within reach at all times, provide assistance with transfer out of bed and ambulation.  Medication Inspection Compliance: Pill count conducted under aseptic conditions, in front of the patient. Neither the pills nor the bottle was removed from the patient's sight at any time. Once count was completed pills were immediately returned to the patient in their original bottle.  Medication: Hydrocodone/APAP Pill/Patch Count: 15 of 45 pills remain Pill/Patch Appearance: Markings consistent with prescribed medication Bottle Appearance: Standard pharmacy container. Clearly labeled. Filled Date: 1 / 10 / 22 Last Medication intake:  Yesterday    UDS:  Summary  Date Value Ref Range Status  04/03/2019 Note  Final    Comment:    ==================================================================== Compliance Drug Analysis, Ur ==================================================================== Test                             Result       Flag       Units Drug Present and Declared for Prescription Verification   Gabapentin  PRESENT      EXPECTED   Baclofen                       PRESENT      EXPECTED    Nortriptyline                  PRESENT      EXPECTED    Nortriptyline may be administered as a prescription drug; it is also    an expected metabolite of amitriptyline.   Acetaminophen                  PRESENT      EXPECTED   Metoprolol                     PRESENT      EXPECTED Drug Present not Declared for Prescription Verification   Ibuprofen                      PRESENT      UNEXPECTED Drug Absent but Declared for Prescription Verification   Amitriptyline                  Not Detected UNEXPECTED    Amitriptyline is almost always present in patients taking this drug    consistently. Absence of amitriptyline could be due to lapse of time    since the last dose or unusual pharmacokinetics (rapid metabolism). ==================================================================== Test                      Result    Flag   Units      Ref Range   Creatinine              198              mg/dL      >=20 ==================================================================== Declared Medications:  The flagging and interpretation on this report are based on the  following declared medications.  Unexpected results may arise from  inaccuracies in the declared medications.  **Note: The testing scope of this panel includes these medications:  Amitriptyline  Baclofen  Gabapentin  Metoprolol  Nortriptyline  **Note: The testing scope of this panel does not include small to  moderate amounts of these reported medications:  Acetaminophen  **Note: The testing scope of this panel does not include the  following reported medications:  Albuterol  Cephalexin  Meloxicam  Pantoprazole  Ranitidine  Simvastatin  Sulfamethoxazole  Sumatriptan  Trimethoprim ==================================================================== For clinical consultation, please call [phone]. ====================================================================      ROS  Constitutional: Denies any fever or  chills Gastrointestinal: No reported hemesis, hematochezia, vomiting, or acute GI distress Musculoskeletal: mid back, right hip pain Neurological: No reported episodes of acute onset apraxia, aphasia, dysarthria, agnosia, amnesia, paralysis, loss of coordination, or loss of consciousness  Medication Review  HYDROcodone-acetaminophen, SUMAtriptan, acetaminophen, albuterol, amitriptyline, baclofen, gabapentin, metoprolol tartrate, nortriptyline, and pantoprazole  History Review  Allergy: Ms. Penland is allergic to peanuts [peanut oil] and penicillins. Drug: Ms. Baksh  reports no history of drug use. Alcohol:  reports no history of alcohol use. Tobacco:  reports that she has never smoked. She has never used smokeless tobacco. Social: Ms. Mireles  reports that she has never smoked. She has never used smokeless tobacco. She reports that she does not drink alcohol and does not use drugs. Medical:  has a past medical history of Hypertension, Neurofibromatosis ([place]), Renal disorder,  Scoliosis of thoracic spine, and Thyroid disease. Surgical: Ms. Matsen  has a past surgical history that includes Cesarean section; chamberlain procedure; and Thyroidectomy. Family: family history includes Cancer in her mother.  Laboratory Chemistry Profile   Renal Lab Results  Component Value Date   BUN 12 05/24/2018   CREATININE 0.65 05/24/2018   GFRAA >60 05/24/2018   GFRNONAA >60 05/24/2018     Hepatic Lab Results  Component Value Date   AST 25 05/24/2018   ALT 29 05/24/2018   ALBUMIN 3.8 05/24/2018   ALKPHOS 103 05/24/2018   LIPASE 28 05/24/2018     Electrolytes Lab Results  Component Value Date   NA 139 05/24/2018   K 3.1 (L) 05/24/2018   CL 104 05/24/2018   CALCIUM 9.0 05/24/2018     Bone No results found for: VD25OH, VD125OH2TOT, [id number], [id number], 25OHVITD1, 25OHVITD2, 25OHVITD3, TESTOFREE, TESTOSTERONE   Inflammation (CRP: Acute Phase) (ESR: Chronic Phase) No results found for: CRP,  ESRSEDRATE, LATICACIDVEN     Note: Above Lab results reviewed.  Recent Imaging Review  US RENAL CLINICAL DATA:  CKD  EXAM: RENAL / URINARY TRACT ULTRASOUND COMPLETE  COMPARISON:  CT renal stone protocol 08/28/2017  FINDINGS: Right Kidney:  Renal measurements: 11.1 x 3.9 x 5.4 cm = volume: 123 mL. Mild cortical thinning. Echogenicity appears slightly increased. No mass or hydronephrosis visualized.  Left Kidney:  Renal measurements: 10.5 x 5.5 x 3.4 cm = volume: 103 mL. Echogenicity appears mildly increased. No mass or hydronephrosis visualized.  Bladder:  Decompressed.  Other:  None.  IMPRESSION: Mild left greater than right increased echogenicity and slight right cortical thinning, as can be seen in medical renal disease.  Electronically Signed   By: Audie Pinto M.D.   On: 02/27/2020 11:22 Note: Reviewed        Physical Exam  General appearance: Well nourished, well developed, and well hydrated. In no apparent acute distress Mental status: Alert, oriented x 3 (person, place, & time)       Respiratory: No evidence of acute respiratory distress Eyes: PERLA Vitals: BP 123/88   Pulse (!) 125   Temp (!) 97.4 F (36.3 C) (Temporal)   Resp 18   Ht 5' 1.5" (1.562 m)   Wt 145 lb (65.8 kg)   SpO2 100%   BMI 26.95 kg/m  BMI: Estimated body mass index is 26.95 kg/m as calculated from the following:   Height as of this encounter: 5' 1.5" (1.562 m).   Weight as of this encounter: 145 lb (65.8 kg). Ideal: Ideal body weight: 48.9 kg (107 lb 14.6 oz) Adjusted ideal body weight: 55.7 kg (122 lb 12 oz)  5 out of 5 strength bilateral lower extremity: Plantar flexion, dorsiflexion, knee flexion, knee extension.   Assessment   Status Diagnosis  Controlled Controlled Controlled 1. Neurofibromatosis, type 1 (von Recklinghausen's disease) ([place])   2. Neuropathic pain   3. Chronic pain syndrome      Plan of Care  Ms. Keelin L Schaben has a current medication  list which includes the following long-term medication(s): albuterol, amitriptyline, gabapentin, [START ON 05/21/2020] hydrocodone-acetaminophen, [START ON 06/20/2020] hydrocodone-acetaminophen, [START ON 07/20/2020] hydrocodone-acetaminophen, metoprolol tartrate, nortriptyline, pantoprazole, and sumatriptan.  Pharmacotherapy (Medications Ordered): Meds ordered this encounter  Medications  . HYDROcodone-acetaminophen (NORCO) 7.5-325 MG tablet    Sig: Take 1-2 tablets by mouth daily as needed for severe pain. Must last 30 days.    Dispense:  45 tablet    Refill:  0    Chronic Pain. (  STOP Act - Not applicable). Fill one day early if closed on scheduled refill date.  Marland Kitchen HYDROcodone-acetaminophen (NORCO) 7.5-325 MG tablet    Sig: Take 1-2 tablets by mouth daily as needed for severe pain. Must last 30 days.    Dispense:  45 tablet    Refill:  0    Chronic Pain. (STOP Act - Not applicable). Fill one day early if closed on scheduled refill date.  Marland Kitchen HYDROcodone-acetaminophen (NORCO) 7.5-325 MG tablet    Sig: Take 1-2 tablets by mouth daily as needed for severe pain. Must last 30 days.    Dispense:  45 tablet    Refill:  0    Chronic Pain. (STOP Act - Not applicable). Fill one day early if closed on scheduled refill date.   Orders:  Orders Placed This Encounter  Procedures  . ToxASSURE Select 13 (MW), Urine    Volume: 30 ml(s). Minimum 3 ml of urine is needed. Document temperature of fresh sample. Indications: Long term (current) use of opiate analgesic [phone])    Order Specific Question:   Release to patient    Answer:   Immediate   Follow-up plan:   Return in about 3 months (around 08/11/2020) for Medication Management, in person.   Recent Visits No visits were found meeting these conditions. Showing recent visits within past 90 days and meeting all other requirements Today's Visits Date Type Provider Dept  05/14/20 Office Visit Gillis Santa, MD [facility]  Showing today's  visits and meeting all other requirements Future Appointments Date Type Provider Dept  07/30/20 Appointment Gillis Santa, MD [facility]  Showing future appointments within next 90 days and meeting all other requirements  I discussed the assessment and treatment plan with the patient. The patient was provided an opportunity to ask questions and all were answered. The patient agreed with the plan and demonstrated an understanding of the instructions.  Patient advised to call back or seek an in-person evaluation if the symptoms or condition worsens.  Duration of encounter: 30 minutes.  Note by: Gillis Santa, MD Date: 05/14/2020; Time: 12:59 PM

## 2020-05-20 LAB — TOXASSURE SELECT 13 (MW), URINE

## 2020-06-08 ENCOUNTER — Other Ambulatory Visit: Payer: Self-pay | Admitting: Internal Medicine

## 2020-06-08 DIAGNOSIS — Z1231 Encounter for screening mammogram for malignant neoplasm of breast: Secondary | ICD-10-CM

## 2020-06-26 ENCOUNTER — Ambulatory Visit
Admission: RE | Admit: 2020-06-26 | Discharge: 2020-06-26 | Disposition: A | Discharge status: Home / Self Care | Payer: Medicare PPO | Source: Ambulatory Visit | Attending: Internal Medicine | Admitting: Internal Medicine

## 2020-06-26 ENCOUNTER — Other Ambulatory Visit: Payer: Self-pay

## 2020-06-26 DIAGNOSIS — Z1231 Encounter for screening mammogram for malignant neoplasm of breast: Secondary | ICD-10-CM | POA: Insufficient documentation

## 2020-07-30 ENCOUNTER — Other Ambulatory Visit: Payer: Self-pay

## 2020-07-30 ENCOUNTER — Encounter: Payer: Self-pay | Admitting: Student in an Organized Health Care Education/Training Program

## 2020-07-30 ENCOUNTER — Ambulatory Visit
Payer: Medicare PPO | Attending: Student in an Organized Health Care Education/Training Program | Admitting: Student in an Organized Health Care Education/Training Program

## 2020-07-30 DIAGNOSIS — Q8501 Neurofibromatosis, type 1: Secondary | ICD-10-CM | POA: Insufficient documentation

## 2020-07-30 DIAGNOSIS — G894 Chronic pain syndrome: Secondary | ICD-10-CM | POA: Insufficient documentation

## 2020-07-30 DIAGNOSIS — M792 Neuralgia and neuritis, unspecified: Secondary | ICD-10-CM | POA: Diagnosis not present

## 2020-07-30 MED ORDER — HYDROCODONE-ACETAMINOPHEN 7.5-325 MG PO TABS: 1.0000 | ORAL_TABLET | Freq: Every day | ORAL | 0 refills | Status: DC | PRN

## 2020-07-30 NOTE — Progress Notes (Signed)
Nursing Pain Medication Assessment:  Safety precautions to be maintained throughout the outpatient stay will include: orient to surroundings, keep bed in low position, maintain call bell within reach at all times, provide assistance with transfer out of bed and ambulation.  Medication Inspection Compliance: Pill count conducted under aseptic conditions, in front of the patient. Neither the pills nor the bottle was removed from the patient's sight at any time. Once count was completed pills were immediately returned to the patient in their original bottle.  Medication: Hydrocodone/APAP Pill/Patch Count: 36 of 45 pills remain Pill/Patch Appearance: Markings consistent with prescribed medication Bottle Appearance: Standard pharmacy container. Clearly labeled. Filled Date:04 / 12 / 2022 Last Medication intake:  Today

## 2020-07-30 NOTE — Progress Notes (Signed)
PROVIDER NOTE: Information contained herein reflects review and annotations entered in association with encounter. Interpretation of such information and data should be left to medically-trained personnel. Information provided to patient can be located elsewhere in the medical record under "Patient Instructions". Document created using STT-dictation technology, any transcriptional errors that may result from process are unintentional.    Patient: [name] [name]  Service Category: E/M  Provider: Gillis Santa, MD  DOB: November 26, 1970  DOS: 07/30/2020  Specialty: Interventional Pain Management  MRN: [id number]  Setting: Ambulatory outpatient  PCP: [name] Maltese, MD  Type: Established Patient    Referring Provider: Perrin Maltese, MD  Location: Office  Delivery: Face-to-face     HPI  [name], a 50 y.o. year old female, is here today because of her No primary diagnosis found.. [name]'s primary complain today is Back Pain (thoracic) Last encounter: My last encounter with her was on 05/14/2020. Pertinent problems: 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 Pain Assessment: Severity of Chronic pain is reported as a 6 /10. Location: Back  /radiates to around left underarm and chest. Onset: More than a month ago. Quality: Sharp,Stabbing,Burning. Timing: Intermittent (frequent). Modifying factor(s): rest and mediction. Vitals:  height is 5' 1" (1.549 m) and weight is 140 lb (63.5 kg). Her temperature is 97.5 F (36.4 C) (abnormal). Her blood pressure is 119/92 (abnormal) and her pulse is 89. Her respiration is 16 and oxygen saturation is 100%.   Reason for encounter: medication management.   No change in medical history since last visit.   Patient's pain is at baseline.  Patient continues multimodal pain regimen as prescribed.  States that it provides pain relief and improvement in functional status.  Patient was started on Lexapro 10 mg by her primary care provider for depression.  She is discontinued nortriptyline.  She continues gabapentin 600 mg 4 times a day.  Pharmacotherapy Assessment   Analgesic: Hydrocodone 7.5 mg twice daily as needed, quantity 45/month; MME equals 15    Monitoring: [place] PMP: PDMP reviewed during this encounter.       Pharmacotherapy: No side-effects or adverse reactions reported. Compliance: No problems identified. Effectiveness: Clinically acceptable.  Dewayne Shorter, RN  07/30/2020  9:38 AM  Signed Nursing Pain Medication Assessment:  Safety precautions to be maintained throughout the outpatient stay will include: orient to surroundings, keep bed in low position, maintain call bell within reach at all times, provide assistance with transfer out of bed and ambulation.  Medication Inspection Compliance: Pill count conducted under aseptic conditions, in front of the patient. Neither the pills nor the bottle was removed from the patient's sight at any time. Once count was completed pills were immediately returned to the patient in their original bottle.  Medication: Hydrocodone/APAP Pill/Patch Count: 36 of 45 pills remain Pill/Patch Appearance: Markings consistent with prescribed medication Bottle Appearance: Standard pharmacy container. Clearly labeled. Filled Date:04 / 12 / 2022 Last Medication intake:  Today    UDS:  Summary  Date Value Ref Range Status  05/14/2020 Note  Final    Comment:    ==================================================================== [employer] (MW) ==================================================================== Test                             Result  Flag       Units  Drug Present and Declared for Prescription Verification   Hydrocodone                     374          EXPECTED   ng/mg creat   Hydromorphone                  78           EXPECTED   ng/mg creat   Dihydrocodeine                 49           EXPECTED   ng/mg creat   Norhydrocodone                 965          EXPECTED   ng/mg creat    Sources of hydrocodone include scheduled prescription medications.    Hydromorphone, dihydrocodeine and norhydrocodone are expected    metabolites of hydrocodone. Hydromorphone and dihydrocodeine are    also available as scheduled prescription medications.  ==================================================================== Test                      Result    Flag   Units      Ref Range   Creatinine              299              mg/dL      >=20 ==================================================================== Declared Medications:  The flagging and interpretation on this report are based on the  following declared medications.  Unexpected results may arise from  inaccuracies in the declared medications.   **Note: The testing scope of this panel includes these medications:   Hydrocodone (Norco)   **Note: The testing scope of this panel does not include the  following reported medications:   Acetaminophen (Tylenol)  Acetaminophen (Norco)  Albuterol (Ventolin HFA)  Amitriptyline (Elavil)  Baclofen (Lioresal)  Gabapentin (Neurontin)  Metoprolol (Lopressor)  Nortriptyline (Pamelor)  Pantoprazole (Protonix)  Sumatriptan (Imitrex) ==================================================================== For clinical consultation, please call [phone]. ====================================================================      ROS  Constitutional: Denies any fever or chills Gastrointestinal: No reported hemesis, hematochezia, vomiting, or acute GI distress Musculoskeletal: Denies any acute onset joint swelling, redness, loss of ROM, or weakness Neurological: No reported episodes of acute onset apraxia, aphasia, dysarthria,  agnosia, amnesia, paralysis, loss of coordination, or loss of consciousness  Medication Review  HYDROcodone-acetaminophen, SUMAtriptan, acetaminophen, albuterol, baclofen, ergocalciferol, escitalopram, gabapentin, metoprolol succinate, metoprolol tartrate, ondansetron, and pantoprazole  History Review  Allergy: Ms. Nuccio is allergic to peanuts [peanut oil] and penicillins. Drug: Ms. Pottenger  reports no history of drug use. Alcohol:  reports no history of alcohol use. Tobacco:  reports that she has never smoked. She has never used smokeless tobacco. Social: Ms. Tortorelli  reports that she has never smoked. She has never used smokeless tobacco. She reports that she does not drink alcohol and does not use drugs. Medical:  has a past medical history of Hypertension, Neurofibromatosis ([place]), Renal disorder, Scoliosis of thoracic spine, and Thyroid disease. Surgical: Ms. Kennerson  has a past surgical history that includes Cesarean section; chamberlain procedure; and Thyroidectomy. Family: family history includes Cancer in her mother.  Laboratory Chemistry Profile   Renal Lab Results  Component Value Date   BUN 12 05/24/2018   CREATININE 0.65 05/24/2018  GFRAA >60 05/24/2018   GFRNONAA >60 05/24/2018     Hepatic Lab Results  Component Value Date   AST 25 05/24/2018   ALT 29 05/24/2018   ALBUMIN 3.8 05/24/2018   ALKPHOS 103 05/24/2018   LIPASE 28 05/24/2018     Electrolytes Lab Results  Component Value Date   NA 139 05/24/2018   K 3.1 (L) 05/24/2018   CL 104 05/24/2018   CALCIUM 9.0 05/24/2018     Bone No results found for: VD25OH, VD125OH2TOT, FT7322GU5, KY7062BJ6, 25OHVITD1, 25OHVITD2, 25OHVITD3, TESTOFREE, TESTOSTERONE   Inflammation (CRP: Acute Phase) (ESR: Chronic Phase) No results found for: CRP, ESRSEDRATE, LATICACIDVEN     Note: Above Lab results reviewed.  Recent Imaging Review  MM 3D SCREEN BREAST BILATERAL CLINICAL DATA:  Screening.  EXAM: DIGITAL SCREENING  BILATERAL MAMMOGRAM WITH TOMOSYNTHESIS AND CAD  TECHNIQUE: Bilateral screening digital craniocaudal and mediolateral oblique mammograms were obtained. Bilateral screening digital breast tomosynthesis was performed. The images were evaluated with computer-aided detection.  COMPARISON:  Previous exam(s).  ACR Breast Density Category b: There are scattered areas of fibroglandular density.  FINDINGS: There are no findings suspicious for malignancy. The images were evaluated with computer-aided detection.  IMPRESSION: No mammographic evidence of malignancy. A result letter of this screening mammogram will be mailed directly to the patient.  RECOMMENDATION: Screening mammogram in one year. (Code:SM-B-01Y)  BI-RADS CATEGORY  1: Negative.  Electronically Signed   By: Lillia Mountain M.D.   On: 06/29/2020 13:06 Note: Reviewed        Physical Exam  General appearance: Well nourished, well developed, and well hydrated. In no apparent acute distress Mental status: Alert, oriented x 3 (person, place, & time)       Respiratory: No evidence of acute respiratory distress Eyes: PERLA Vitals: BP (!) 119/92   Pulse 89   Temp (!) 97.5 F (36.4 C)   Resp 16   Ht 5' 1" (1.549 m)   Wt 140 lb (63.5 kg)   SpO2 100%   BMI 26.45 kg/m  BMI: Estimated body mass index is 26.45 kg/m as calculated from the following:   Height as of this encounter: 5' 1" (1.549 m).   Weight as of this encounter: 140 lb (63.5 kg). Ideal: Ideal body weight: 47.8 kg (105 lb 6.1 oz) Adjusted ideal body weight: 54.1 kg (119 lb 3.7 oz)  Assessment   Status Diagnosis  Controlled Controlled Controlled 1. 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 Neuropathic pain   3. Chronic pain syndrome        Plan of Care   Ms. Velva L Murrillo has a current medication list which includes the following long-term medication(s): albuterol, escitalopram, gabapentin, [START ON 08/19/2020]  hydrocodone-acetaminophen, [START ON 09/18/2020] hydrocodone-acetaminophen, [START ON 10/18/2020] hydrocodone-acetaminophen, metoprolol tartrate, metoprolol succinate, pantoprazole, and sumatriptan.  Pharmacotherapy (Medications Ordered): Meds ordered this encounter  Medications  . HYDROcodone-acetaminophen (NORCO) 7.5-325 MG tablet    Sig: Take 1-2 tablets by mouth daily as needed for severe pain. Must last 30 days.    Dispense:  45 tablet    Refill:  0    Chronic Pain. (STOP Act - Not applicable). Fill one day early if closed on scheduled refill date.  Marland Kitchen HYDROcodone-acetaminophen (NORCO) 7.5-325 MG tablet    Sig: Take 1-2 tablets by mouth daily as needed for severe pain. Must last 30 days.    Dispense:  45 tablet    Refill:  0    Chronic Pain. (STOP Act - Not  applicable). Fill one day early if closed on scheduled refill date.  Marland Kitchen HYDROcodone-acetaminophen (NORCO) 7.5-325 MG tablet    Sig: Take 1-2 tablets by mouth daily as needed for severe pain. Must last 30 days.    Dispense:  45 tablet    Refill:  0    Chronic Pain. (STOP Act - Not applicable). Fill one day early if closed on scheduled refill date.   Follow-up plan:   Return in about 3 months (around 10/29/2020) for Medication Management, in person.   Recent Visits Date Type Provider Dept  05/14/20 Office Visit Gillis Santa, MD [facility]  Showing recent visits within past 90 days and meeting all other requirements Today's Visits Date Type Provider Dept  07/30/20 Office Visit Gillis Santa, MD [facility]  Showing today's visits and meeting all other requirements Future Appointments Date Type Provider Dept  09/29/20 Appointment Gillis Santa, MD [facility]  Showing future appointments within next 90 days and meeting all other requirements  I discussed the assessment and treatment plan with the patient. The patient was provided an opportunity to ask questions and all were answered. The patient  agreed with the plan and demonstrated an understanding of the instructions.  Patient advised to call back or seek an in-person evaluation if the symptoms or condition worsens.  Duration of encounter:30 minutes.  Note by: Gillis Santa, MD Date: 07/30/2020; Time: 9:58 AM

## 2020-09-28 DIAGNOSIS — R300 Dysuria: Secondary | ICD-10-CM | POA: Diagnosis not present

## 2020-09-28 DIAGNOSIS — F411 Generalized anxiety disorder: Secondary | ICD-10-CM | POA: Diagnosis not present

## 2020-09-28 DIAGNOSIS — F410 Panic disorder [episodic paroxysmal anxiety] without agoraphobia: Secondary | ICD-10-CM | POA: Diagnosis not present

## 2020-09-28 DIAGNOSIS — N390 Urinary tract infection, site not specified: Secondary | ICD-10-CM | POA: Diagnosis not present

## 2020-09-28 DIAGNOSIS — F4320 Adjustment disorder, unspecified: Secondary | ICD-10-CM | POA: Diagnosis not present

## 2020-09-28 DIAGNOSIS — F332 Major depressive disorder, recurrent severe without psychotic features: Secondary | ICD-10-CM | POA: Diagnosis not present

## 2020-09-29 ENCOUNTER — Encounter: Payer: Medicare PPO | Admitting: Student in an Organized Health Care Education/Training Program

## 2020-10-07 DIAGNOSIS — F332 Major depressive disorder, recurrent severe without psychotic features: Secondary | ICD-10-CM | POA: Diagnosis not present

## 2020-10-07 DIAGNOSIS — N390 Urinary tract infection, site not specified: Secondary | ICD-10-CM | POA: Diagnosis not present

## 2020-10-07 DIAGNOSIS — F4320 Adjustment disorder, unspecified: Secondary | ICD-10-CM | POA: Diagnosis not present

## 2020-10-22 ENCOUNTER — Other Ambulatory Visit: Payer: Self-pay

## 2020-10-31 ENCOUNTER — Emergency Department
Admission: EM | Admit: 2020-10-31 | Discharge: 2020-10-31 | Disposition: A | Discharge status: Home / Self Care | Payer: Medicare Other | Attending: Student in an Organized Health Care Education/Training Program | Admitting: Student in an Organized Health Care Education/Training Program

## 2020-10-31 ENCOUNTER — Other Ambulatory Visit: Payer: Self-pay

## 2020-10-31 DIAGNOSIS — M546 Pain in thoracic spine: Secondary | ICD-10-CM | POA: Diagnosis not present

## 2020-10-31 DIAGNOSIS — R0781 Pleurodynia: Secondary | ICD-10-CM | POA: Insufficient documentation

## 2020-10-31 DIAGNOSIS — Z5321 Procedure and treatment not carried out due to patient leaving prior to being seen by health care provider: Secondary | ICD-10-CM | POA: Insufficient documentation

## 2020-10-31 LAB — COMPREHENSIVE METABOLIC PANEL
ALT: 12 U/L (ref 0–44)
AST: 19 U/L (ref 15–41)
Albumin: 4.2 g/dL (ref 3.5–5.0)
Alkaline Phosphatase: 106 U/L (ref 38–126)
Anion gap: 8 (ref 5–15)
BUN: 15 mg/dL (ref 6–20)
CO2: 24 mmol/L (ref 22–32)
Calcium: 9.3 mg/dL (ref 8.9–10.3)
Chloride: 106 mmol/L (ref 98–111)
Creatinine, Ser: 0.82 mg/dL (ref 0.44–1.00)
GFR, Estimated: >60 mL/min (ref >60)
Glucose, Bld: 115 mg/dL — ABNORMAL HIGH (ref 70–99)
Potassium: 3.6 mmol/L (ref 3.5–5.1)
Sodium: 138 mmol/L (ref 135–145)
Total Bilirubin: 0.6 mg/dL (ref 0.3–1.2)
Total Protein: 7.7 g/dL (ref 6.5–8.1)

## 2020-10-31 LAB — CBC
HCT: 36.8 % (ref 36.0–46.0)
Hemoglobin: 12.1 g/dL (ref 12.0–15.0)
MCH: 30.6 pg (ref 26.0–34.0)
MCHC: 32.9 g/dL (ref 30.0–36.0)
MCV: 92.9 fL (ref 80.0–100.0)
Platelets: 364 10*3/uL (ref 150–400)
RBC: 3.96 MIL/uL (ref 3.87–5.11)
RDW: 12.5 % (ref 11.5–15.5)
WBC: 8.1 10*3/uL (ref 4.0–10.5)
nRBC: 0 % (ref 0.0–0.2)

## 2020-10-31 NOTE — ED Triage Notes (Signed)
Pt states she believes she has a kidney infection. Pt states was treated for same approx on month pta. Pt complains of mid back pain that radiates around under ribs. Pt states has had nausea and diarrhea, denies shob.

## 2020-11-01 DIAGNOSIS — R197 Diarrhea, unspecified: Secondary | ICD-10-CM | POA: Diagnosis not present

## 2020-11-01 DIAGNOSIS — M545 Low back pain, unspecified: Secondary | ICD-10-CM | POA: Diagnosis not present

## 2020-11-01 DIAGNOSIS — N12 Tubulo-interstitial nephritis, not specified as acute or chronic: Secondary | ICD-10-CM | POA: Diagnosis not present

## 2020-11-01 DIAGNOSIS — R1013 Epigastric pain: Secondary | ICD-10-CM | POA: Diagnosis not present

## 2020-11-01 DIAGNOSIS — Z87440 Personal history of urinary (tract) infections: Secondary | ICD-10-CM | POA: Diagnosis not present

## 2020-11-01 DIAGNOSIS — R000 Tachycardia, unspecified: Secondary | ICD-10-CM | POA: Diagnosis not present

## 2020-11-01 DIAGNOSIS — R112 Nausea with vomiting, unspecified: Secondary | ICD-10-CM | POA: Diagnosis not present

## 2020-11-01 DIAGNOSIS — R509 Fever, unspecified: Secondary | ICD-10-CM | POA: Diagnosis not present

## 2020-11-01 DIAGNOSIS — R1031 Right lower quadrant pain: Secondary | ICD-10-CM | POA: Diagnosis not present

## 2020-11-01 DIAGNOSIS — N189 Chronic kidney disease, unspecified: Secondary | ICD-10-CM | POA: Diagnosis not present

## 2020-11-01 DIAGNOSIS — Z880 Allergy status to penicillin: Secondary | ICD-10-CM | POA: Diagnosis not present

## 2020-11-01 DIAGNOSIS — Z87442 Personal history of urinary calculi: Secondary | ICD-10-CM | POA: Diagnosis not present

## 2020-11-01 DIAGNOSIS — I129 Hypertensive chronic kidney disease with stage 1 through stage 4 chronic kidney disease, or unspecified chronic kidney disease: Secondary | ICD-10-CM | POA: Diagnosis not present

## 2020-11-03 ENCOUNTER — Other Ambulatory Visit: Payer: Self-pay

## 2020-11-03 ENCOUNTER — Ambulatory Visit
Payer: Medicare Other | Attending: Student in an Organized Health Care Education/Training Program | Admitting: Student in an Organized Health Care Education/Training Program

## 2020-11-03 ENCOUNTER — Encounter: Payer: Self-pay | Admitting: Student in an Organized Health Care Education/Training Program

## 2020-11-03 VITALS — BP 125/87 | HR 79 | Temp 97.2°F | Resp 16 | Ht 61.5 in | Wt 132.0 lb

## 2020-11-03 DIAGNOSIS — R0789 Other chest pain: Secondary | ICD-10-CM

## 2020-11-03 DIAGNOSIS — M545 Low back pain, unspecified: Secondary | ICD-10-CM | POA: Diagnosis not present

## 2020-11-03 DIAGNOSIS — Q8501 Neurofibromatosis, type 1: Secondary | ICD-10-CM | POA: Diagnosis present

## 2020-11-03 DIAGNOSIS — Z0289 Encounter for other administrative examinations: Secondary | ICD-10-CM | POA: Diagnosis not present

## 2020-11-03 DIAGNOSIS — M546 Pain in thoracic spine: Secondary | ICD-10-CM | POA: Diagnosis not present

## 2020-11-03 DIAGNOSIS — G894 Chronic pain syndrome: Secondary | ICD-10-CM | POA: Insufficient documentation

## 2020-11-03 DIAGNOSIS — G8929 Other chronic pain: Secondary | ICD-10-CM | POA: Diagnosis not present

## 2020-11-03 DIAGNOSIS — M792 Neuralgia and neuritis, unspecified: Secondary | ICD-10-CM | POA: Insufficient documentation

## 2020-11-03 DIAGNOSIS — M4127 Other idiopathic scoliosis, lumbosacral region: Secondary | ICD-10-CM | POA: Diagnosis not present

## 2020-11-03 MED ORDER — HYDROCODONE-ACETAMINOPHEN 7.5-325 MG PO TABS: 1.0000 | ORAL_TABLET | Freq: Every day | ORAL | 0 refills | Status: DC | PRN

## 2020-11-03 MED ORDER — CYCLOBENZAPRINE HCL 10 MG PO TABS: 10.0000 mg | ORAL_TABLET | Freq: Three times a day (TID) | ORAL | 3 refills | Status: DC | PRN

## 2020-11-03 NOTE — Progress Notes (Signed)
PROVIDER NOTE: Information contained herein reflects review and annotations entered in association with encounter. Interpretation of such information and data should be left to medically-trained personnel. Information provided to patient can be located elsewhere in the medical record under "Patient Instructions". Document created using STT-dictation technology, any transcriptional errors that may result from process are unintentional.    Patient: [name] [name]  Service Category: E/M  Provider: Gillis Santa, MD  DOB: [birth year]  DOS: 11/03/2020  Specialty: Interventional Pain Management  MRN: [id number]  Setting: Ambulatory outpatient  PCP: Perrin Maltese, MD  Type: Established Patient    Referring Provider: Perrin Maltese, MD  Location: Office  Delivery: Face-to-face     HPI  [name] [name], a 50 y.o. 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 [name] [name]'s primary complain today is Back Pain (Upper to mid back ) and Leg Pain (Right ) Last encounter: My last encounter with her was on 07/30/2020. 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 Pain Assessment: Severity of Chronic pain is reported as a 7 /10. Location: Back (leg right side) Mid, Upper/back pain into left shoulder and left arm.. Onset: More than a month ago. Quality: Discomfort, Constant, Sharp, Stabbing, Other (Comment) (sometimes electrifying.). Timing: Constant. Modifying factor(s): medications, rest, heat. Vitals:  height is 5' 1.5" (1.562 m) and weight is 132 lb (59.9 kg). Her temporal temperature is 97.2 F (36.2 C) (abnormal). Her blood pressure is 125/87 and her pulse is 79. Her  respiration is 16 and oxygen saturation is 100%.   Reason for encounter: medication management.   No change in medical history since last visit.  Patient's pain is at baseline.  Patient continues multimodal pain regimen as prescribed.  States that it provides pain relief and improvement in functional status.  Stopped Lexapro 10 mg. Started on  Effexor instead.  She continues gabapentin 600 mg 4 times a day.  ED visit on "Sunday, on Keflex QID for total course of 10 days for UTI (states that she has about 3-4 UTI's in the last 6 months). Has seen nephrology and urology in the past about this.  Patient would like to discontinue baclofen and try Flexeril which she has had benefit with in the past.  Pharmacotherapy Assessment  Analgesic: Hydrocodone 7.5 mg twice daily as needed, quantity 45/month; MME equals 15    Monitoring: [place] PMP: PDMP reviewed during this encounter.       Pharmacotherapy: No side-effects or adverse reactions reported. Compliance: No problems identified. Effectiveness: Clinically acceptable.  UDS:  Summary  Date Value Ref Range Status  05/14/2020 Note  Final    Comment:    ==================================================================== [employer] (MW) ==================================================================== Test                             Result       Flag       Units  Drug Present and Declared for Prescription Verification   Hydrocodone                    37" 4          EXPECTED   ng/mg creat   Hydromorphone  78           EXPECTED   ng/mg creat   Dihydrocodeine                 49           EXPECTED   ng/mg creat   Norhydrocodone                 965          EXPECTED   ng/mg creat    Sources of hydrocodone include scheduled prescription medications.    Hydromorphone, dihydrocodeine and norhydrocodone are expected    metabolites of hydrocodone. Hydromorphone and dihydrocodeine are    also available as scheduled prescription  medications.  ==================================================================== Test                      Result    Flag   Units      Ref Range   Creatinine              299              mg/dL      >=20 ==================================================================== Declared Medications:  The flagging and interpretation on this report are based on the  following declared medications.  Unexpected results may arise from  inaccuracies in the declared medications.   **Note: The testing scope of this panel includes these medications:   Hydrocodone (Norco)   **Note: The testing scope of this panel does not include the  following reported medications:   Acetaminophen (Tylenol)  Acetaminophen (Norco)  Albuterol (Ventolin HFA)  Amitriptyline (Elavil)  Baclofen (Lioresal)  Gabapentin (Neurontin)  Metoprolol (Lopressor)  Nortriptyline (Pamelor)  Pantoprazole (Protonix)  Sumatriptan (Imitrex) ==================================================================== For clinical consultation, please call [phone]. ====================================================================       ROS  Constitutional: Denies any fever or chills Gastrointestinal: No reported hemesis, hematochezia, vomiting, or acute GI distress Musculoskeletal: Denies any acute onset joint swelling, redness, loss of ROM, or weakness Neurological: No reported episodes of acute onset apraxia, aphasia, dysarthria, agnosia, amnesia, paralysis, loss of coordination, or loss of consciousness  Medication Review  HYDROcodone-acetaminophen, SUMAtriptan, acetaminophen, albuterol, cephALEXin, clonazePAM, cyclobenzaprine, desvenlafaxine, ergocalciferol, gabapentin, metoprolol succinate, ondansetron, oxybutynin, pantoprazole, and phenazopyridine  History Review  Allergy: [name] [name] is allergic to peanuts [peanut oil] and penicillins. Drug: [name] [name]  reports no history of drug use. Alcohol:  reports no history of  alcohol use. Tobacco:  reports that she has never smoked. She has never used smokeless tobacco. Social: [name] [name]  reports that she has never smoked. She has never used smokeless tobacco. She reports that she does not drink alcohol and does not use drugs. Medical:  has a past medical history of Hypertension, Neurofibromatosis ([place]), Renal disorder, Scoliosis of thoracic spine, and Thyroid disease. Surgical: [name] [name]  has a past surgical history that includes Cesarean section; chamberlain procedure; and Thyroidectomy. Family: family history includes Cancer in her mother.  Laboratory Chemistry Profile   Renal Lab Results  Component Value Date   BUN 15 10/31/2020   CREATININE 0.82 10/31/2020   GFRAA >60 05/24/2018   GFRNONAA >60 10/31/2020     Hepatic Lab Results  Component Value Date   AST 19 10/31/2020   ALT 12 10/31/2020   ALBUMIN 4.2 10/31/2020   ALKPHOS 106 10/31/2020   LIPASE 28 05/24/2018     Electrolytes Lab Results  Component Value Date   NA 138 10/31/2020   K 3.6 10/31/2020   CL 106 10/31/2020  CALCIUM 9.3 10/31/2020     Bone No results found for: VD25OH, [id number], [id number], DV7616WV3, 25OHVITD1, 25OHVITD2, 25OHVITD3, TESTOFREE, TESTOSTERONE   Inflammation (CRP: Acute Phase) (ESR: Chronic Phase) No results found for: CRP, ESRSEDRATE, LATICACIDVEN     Note: Above Lab results reviewed.  Recent Imaging Review  MM 3D SCREEN BREAST BILATERAL CLINICAL DATA:  Screening.  EXAM: DIGITAL SCREENING BILATERAL MAMMOGRAM WITH TOMOSYNTHESIS AND CAD  TECHNIQUE: Bilateral screening digital craniocaudal and mediolateral oblique mammograms were obtained. Bilateral screening digital breast tomosynthesis was performed. The images were evaluated with computer-aided detection.  COMPARISON:  Previous exam(s).  ACR Breast Density Category b: There are scattered areas of fibroglandular density.  FINDINGS: There are no findings suspicious for malignancy. The images  were evaluated with computer-aided detection.  IMPRESSION: No mammographic evidence of malignancy. A result letter of this screening mammogram will be mailed directly to the patient.  RECOMMENDATION: Screening mammogram in one year. (Code:SM-B-01Y)  BI-RADS CATEGORY  1: Negative.  Electronically Signed   By: Lillia Mountain M.D.   On: 06/29/2020 13:06 Note: Reviewed        Physical Exam  General appearance: Well nourished, well developed, and well hydrated. In no apparent acute distress Mental status: Alert, oriented x 3 (person, place, & time)       Respiratory: No evidence of acute respiratory distress Eyes: PERLA Vitals: BP 125/87 (BP Location: Right Arm, Patient Position: Sitting, Cuff Size: Normal)   Pulse 79   Temp (!) 97.2 F (36.2 C) (Temporal)   Resp 16   Ht 5' 1.5" (1.562 m)   Wt 132 lb (59.9 kg)   SpO2 100%   BMI 24.54 kg/m  BMI: Estimated body mass index is 24.54 kg/m as calculated from the following:   Height as of this encounter: 5' 1.5" (1.562 m).   Weight as of this encounter: 132 lb (59.9 kg). Ideal: Ideal body weight: 48.9 kg (107 lb 14.6 oz) Adjusted ideal body weight: 53.3 kg (117 lb 8.8 oz)  +low back pain  5 out of 5 strength bilateral lower extremity: Plantar flexion, dorsiflexion, knee flexion, knee extension.   Assessment   Status Diagnosis  Controlled Controlled Controlled 1. 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 Other idiopathic scoliosis, lumbosacral region   3. Back pain of thoracolumbar region   4. Pain medication agreement signed   5. Chest wall pain, chronic   6. Neuropathic pain   7. Chronic pain syndrome        Plan of Care   [name] [name] has a current medication list which includes the following long-term medication(s): albuterol, clonazepam, desvenlafaxine, gabapentin, metoprolol succinate, pantoprazole, sumatriptan, [START ON 11/22/2020] hydrocodone-acetaminophen, [START ON 12/22/2020]  hydrocodone-acetaminophen, and [START ON 01/21/2021] hydrocodone-acetaminophen.  Pharmacotherapy (Medications Ordered): Meds ordered this encounter  Medications   HYDROcodone-acetaminophen (NORCO) 7.5-325 MG tablet    Sig: Take 1-2 tablets by mouth daily as needed for severe pain. Must last 30 days.    Dispense:  45 tablet    Refill:  0    Chronic Pain. (STOP Act - Not applicable). Fill one day early if closed on scheduled refill date.   HYDROcodone-acetaminophen (NORCO) 7.5-325 MG tablet    Sig: Take 1-2 tablets by mouth daily as needed for severe pain. Must last 30 days.    Dispense:  45 tablet    Refill:  0    Chronic Pain. (STOP Act - Not applicable). Fill one day early if closed on scheduled refill date.  HYDROcodone-acetaminophen (NORCO) 7.5-325 MG tablet    Sig: Take 1-2 tablets by mouth daily as needed for severe pain. Must last 30 days.    Dispense:  45 tablet    Refill:  0    Chronic Pain. (STOP Act - Not applicable). Fill one day early if closed on scheduled refill date.   cyclobenzaprine (FLEXERIL) 10 MG tablet    Sig: Take 1 tablet (10 mg total) by mouth 3 (three) times daily as needed for muscle spasms.    Dispense:  90 tablet    Refill:  3    Do not place this medication, or any other prescription from our practice, on "Automatic Refill". Patient may have prescription filled one day early if pharmacy is closed on scheduled refill date.    Follow-up plan:   Return in about 4 months (around 02/18/2021) for Medication Management, in person.   Recent Visits No visits were found meeting these conditions. Showing recent visits within past 90 days and meeting all other requirements Today's Visits Date Type Provider Dept  11/03/20 Office Visit Gillis Santa, MD [facility]  Showing today's visits and meeting all other requirements Future Appointments No visits were found meeting these conditions. Showing future appointments within next 90 days and meeting all  other requirements I discussed the assessment and treatment plan with the patient. The patient was provided an opportunity to ask questions and all were answered. The patient agreed with the plan and demonstrated an understanding of the instructions.  Patient advised to call back or seek an in-person evaluation if the symptoms or condition worsens.  Duration of encounter:30 minutes.  Note by: Gillis Santa, MD Date: 11/03/2020; Time: 2:40 PM

## 2020-11-03 NOTE — Progress Notes (Signed)
Nursing Pain Medication Assessment:  Safety precautions to be maintained throughout the outpatient stay will include: orient to surroundings, keep bed in low position, maintain call bell within reach at all times, provide assistance with transfer out of bed and ambulation.  Medication Inspection Compliance: Pill count conducted under aseptic conditions, in front of the patient. Neither the pills nor the bottle was removed from the patient's sight at any time. Once count was completed pills were immediately returned to the patient in their original bottle.  Medication: Hydrocodone/APAP Pill/Patch Count:  29 of 45 pills remain Pill/Patch Appearance: Markings consistent with prescribed medication Bottle Appearance: Standard pharmacy container. Clearly labeled. Filled Date: 07 / 14 / 2022 Last Medication intake:  Today

## 2020-12-17 DIAGNOSIS — R519 Headache, unspecified: Secondary | ICD-10-CM | POA: Diagnosis not present

## 2020-12-17 DIAGNOSIS — D3610 Benign neoplasm of peripheral nerves and autonomic nervous system, unspecified: Secondary | ICD-10-CM | POA: Diagnosis not present

## 2020-12-17 DIAGNOSIS — M542 Cervicalgia: Secondary | ICD-10-CM | POA: Diagnosis not present

## 2020-12-17 DIAGNOSIS — R200 Anesthesia of skin: Secondary | ICD-10-CM | POA: Diagnosis not present

## 2021-02-17 ENCOUNTER — Encounter: Payer: Medicare Other | Admitting: Student in an Organized Health Care Education/Training Program

## 2021-02-18 ENCOUNTER — Encounter: Payer: Medicare Other | Admitting: Student in an Organized Health Care Education/Training Program

## 2021-02-22 ENCOUNTER — Other Ambulatory Visit: Payer: Self-pay

## 2021-02-22 ENCOUNTER — Ambulatory Visit
Payer: Medicare Other | Attending: Student in an Organized Health Care Education/Training Program | Admitting: Student in an Organized Health Care Education/Training Program

## 2021-02-22 ENCOUNTER — Encounter: Payer: Self-pay | Admitting: Student in an Organized Health Care Education/Training Program

## 2021-02-22 VITALS — BP 128/95 | HR 113 | Temp 97.0°F | Resp 16 | Ht 62.0 in | Wt 140.0 lb

## 2021-02-22 DIAGNOSIS — G894 Chronic pain syndrome: Secondary | ICD-10-CM | POA: Diagnosis not present

## 2021-02-22 DIAGNOSIS — M546 Pain in thoracic spine: Secondary | ICD-10-CM | POA: Insufficient documentation

## 2021-02-22 DIAGNOSIS — M545 Low back pain, unspecified: Secondary | ICD-10-CM | POA: Diagnosis not present

## 2021-02-22 DIAGNOSIS — M792 Neuralgia and neuritis, unspecified: Secondary | ICD-10-CM | POA: Insufficient documentation

## 2021-02-22 DIAGNOSIS — Q8501 Neurofibromatosis, type 1: Secondary | ICD-10-CM | POA: Insufficient documentation

## 2021-02-22 MED ORDER — HYDROCODONE-ACETAMINOPHEN 7.5-325 MG PO TABS: 1.0000 | ORAL_TABLET | Freq: Every day | ORAL | 0 refills | Status: DC | PRN

## 2021-02-22 NOTE — Progress Notes (Signed)
Nursing Pain Medication Assessment:  Safety precautions to be maintained throughout the outpatient stay will include: orient to surroundings, keep bed in low position, maintain call bell within reach at all times, provide assistance with transfer out of bed and ambulation.  Medication Inspection Compliance: Pill count conducted under aseptic conditions, in front of the patient. Neither the pills nor the bottle was removed from the patient's sight at any time. Once count was completed pills were immediately returned to the patient in their original bottle.  Medication: Hydrocodone/APAP Pill/Patch Count:  4 of 45 pills remain Pill/Patch Appearance: Markings consistent with prescribed medication Bottle Appearance: Standard pharmacy container. Clearly labeled. Filled Date: 14 / 15 / 2022 Last Medication intake:  Today

## 2021-02-22 NOTE — Progress Notes (Signed)
PROVIDER NOTE: Information contained herein reflects review and annotations entered in association with encounter. Interpretation of such information and data should be left to medically-trained personnel. Information provided to patient can be located elsewhere in the medical record under "Patient Instructions". Document created using STT-dictation technology, any transcriptional errors that may result from process are unintentional.    Patient: [name] [name]  Service Category: E/M  Provider: Gillis Santa, MD  DOB: 01/06/1971  DOS: 02/22/2021  Specialty: Interventional Pain Management  MRN: [id number]  Setting: Ambulatory outpatient  PCP: [name] Maltese, MD  Type: Established Patient    Referring Provider: Perrin Maltese, MD  Location: Office  Delivery: Face-to-face     HPI  [name], a 50 y.o. 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 [name]'s primary complain today is Back Pain (Thoracic and lumbar right ) Last encounter: My last encounter with her was on 11/03/20 Pertinent problems: [name] has Back pain of thoracolumbar region; Neurofibromatosis, type 1 (von Recklinghausen's disease) ([place]); Idiopathic scoliosis; Chest wall pain, chronic; Thoracic spine pain; Neurofibroma; Neuropathic pain; Neurofibromatosis, type 1 ([place]); and Chronic pain syndrome on their pertinent problem list. Pain Assessment: Severity of Chronic pain is reported as a 8 /10. Location: Back Mid, Lower, Right/denies. Onset: More than a month ago. Quality: Discomfort, Constant, Sharp, Aching (thoracic area sharp and electrical). Timing: Constant. Modifying factor(s): rest, medications. Vitals:  height is '5\' 2"'  (1.575 m) and weight is 140 lb (63.5 kg). Her temporal temperature is 97 F (36.1 C) (abnormal). Her blood pressure is 128/95 (abnormal) and her pulse is 113 (abnormal). Her respiration is 16 and oxygen saturation is 100%.   Reason  for encounter: medication management.   Patient continues multimodal pain regimen as prescribed.  States that it provides pain relief and improvement in functional status. Patient having increased lower back and bilateral hip pain.  No inciting or traumatic event.  States that it is getting better.  I have ordered x-rays of her lumbar spine and hips should things worsen. We also briefly discussed spinal cord stimulation today.   Pharmacotherapy Assessment  Analgesic: Hydrocodone 7.5 mg twice daily as needed, quantity 45/month; MME equals 15    Monitoring: [place] PMP: PDMP reviewed during this encounter.       Pharmacotherapy: No side-effects or adverse reactions reported. Compliance: No problems identified. Effectiveness: Clinically acceptable.  UDS:  Summary  Date Value Ref Range Status  05/14/2020 Note  Final    Comment:    ==================================================================== [employer] Select 13 (MW) ==================================================================== Test                             Result       Flag       Units  Drug Present and Declared for Prescription Verification   Hydrocodone                    374          EXPECTED   ng/mg creat   Hydromorphone                  78           EXPECTED   ng/mg creat   Dihydrocodeine                 49           EXPECTED  ng/mg creat   Norhydrocodone                 965          EXPECTED   ng/mg creat    Sources of hydrocodone include scheduled prescription medications.    Hydromorphone, dihydrocodeine and norhydrocodone are expected    metabolites of hydrocodone. Hydromorphone and dihydrocodeine are    also available as scheduled prescription medications.  ==================================================================== Test                      Result    Flag   Units      Ref Range   Creatinine              299              mg/dL       >=20 ==================================================================== Declared Medications:  The flagging and interpretation on this report are based on the  following declared medications.  Unexpected results may arise from  inaccuracies in the declared medications.   **Note: The testing scope of this panel includes these medications:   Hydrocodone (Norco)   **Note: The testing scope of this panel does not include the  following reported medications:   Acetaminophen (Tylenol)  Acetaminophen (Norco)  Albuterol (Ventolin HFA)  Amitriptyline (Elavil)  Baclofen (Lioresal)  Gabapentin (Neurontin)  Metoprolol (Lopressor)  Nortriptyline (Pamelor)  Pantoprazole (Protonix)  Sumatriptan (Imitrex) ==================================================================== For clinical consultation, please call [phone]. ====================================================================       ROS  Constitutional: Denies any fever or chills Gastrointestinal: No reported hemesis, hematochezia, vomiting, or acute GI distress Musculoskeletal:  Low back, bilateral hip pain Neurological: No reported episodes of acute onset apraxia, aphasia, dysarthria, agnosia, amnesia, paralysis, loss of coordination, or loss of consciousness  Medication Review  HYDROcodone-acetaminophen, SUMAtriptan, acetaminophen, albuterol, cyclobenzaprine, desvenlafaxine, ergocalciferol, gabapentin, metoprolol succinate, nortriptyline, ondansetron, oxybutynin, pantoprazole, and phenazopyridine  History Review  Allergy: [name] is allergic to peanuts [peanut oil] and penicillins. Drug: [name]  reports no history of drug use. Alcohol:  reports no history of alcohol use. Tobacco:  reports that she has never smoked. She has never used smokeless tobacco. Social: [name]  reports that she has never smoked. She has never used smokeless tobacco. She reports that she does not drink alcohol and does not use  drugs. Medical:  has a past medical history of Hypertension, Neurofibromatosis ([place]), Renal disorder, Scoliosis of thoracic spine, and Thyroid disease. Surgical: [name]  has a past surgical history that includes Cesarean section; chamberlain procedure; and Thyroidectomy. Family: family history includes Cancer in her mother.  Laboratory Chemistry Profile   Renal Lab Results  Component Value Date   BUN 15 10/31/2020   CREATININE 0.82 10/31/2020   GFRAA >60 05/24/2018   GFRNONAA >60 10/31/2020     Hepatic Lab Results  Component Value Date   AST 19 10/31/2020   ALT 12 10/31/2020   ALBUMIN 4.2 10/31/2020   ALKPHOS 106 10/31/2020   LIPASE 28 05/24/2018     Electrolytes Lab Results  Component Value Date   NA 138 10/31/2020   K 3.6 10/31/2020   CL 106 10/31/2020   CALCIUM 9.3 10/31/2020     Bone No results found for: VD25OH, VD125OH2TOT, UY4034VQ2, VZ5638VF6, 25OHVITD1, 25OHVITD2, 25OHVITD3, TESTOFREE, TESTOSTERONE   Inflammation (CRP: Acute Phase) (ESR: Chronic Phase) No results found for: CRP, ESRSEDRATE, LATICACIDVEN     Note: Above Lab results reviewed.  Recent Imaging Review  MM 3D SCREEN  BREAST BILATERAL CLINICAL DATA:  Screening.  EXAM: DIGITAL SCREENING BILATERAL MAMMOGRAM WITH TOMOSYNTHESIS AND CAD  TECHNIQUE: Bilateral screening digital craniocaudal and mediolateral oblique mammograms were obtained. Bilateral screening digital breast tomosynthesis was performed. The images were evaluated with computer-aided detection.  COMPARISON:  Previous exam(s).  ACR Breast Density Category b: There are scattered areas of fibroglandular density.  FINDINGS: There are no findings suspicious for malignancy. The images were evaluated with computer-aided detection.  IMPRESSION: No mammographic evidence of malignancy. A result letter of this screening mammogram will be mailed directly to the patient.  RECOMMENDATION: Screening mammogram in one year.  (Code:SM-B-01Y)  BI-RADS CATEGORY  1: Negative.  Electronically Signed   By: Lillia Mountain M.D.   On: 06/29/2020 13:06 Note: Reviewed        Physical Exam  General appearance: Well nourished, well developed, and well hydrated. In no apparent acute distress Mental status: Alert, oriented x 3 (person, place, & time)       Respiratory: No evidence of acute respiratory distress Eyes: PERLA Vitals: BP (!) 128/95 (BP Location: Right Arm, Patient Position: Sitting, Cuff Size: Normal)   Pulse (!) 113   Temp (!) 97 F (36.1 C) (Temporal)   Resp 16   Ht '5\' 2"'  (1.575 m)   Wt 140 lb (63.5 kg)   SpO2 100%   BMI 25.61 kg/m  BMI: Estimated body mass index is 25.61 kg/m as calculated from the following:   Height as of this encounter: '5\' 2"'  (1.575 m).   Weight as of this encounter: 140 lb (63.5 kg). Ideal: Ideal body weight: 50.1 kg (110 lb 7.2 oz) Adjusted ideal body weight: 55.5 kg (122 lb 4.3 oz)  +low back pain, bilateral hip pain  5 out of 5 strength bilateral lower extremity: Plantar flexion, dorsiflexion, knee flexion, knee extension.   Assessment   Status Diagnosis  Controlled Controlled Controlled 1. Neurofibromatosis, type 1 (von Recklinghausen's disease) ([place])   2. Neuropathic pain   3. Chronic pain syndrome   4. Back pain of thoracolumbar region         Plan of Care   Ms. Naylin L Fuson has a current medication list which includes the following long-term medication(s): albuterol, desvenlafaxine, gabapentin, metoprolol succinate, pantoprazole, sumatriptan, hydrocodone-acetaminophen, [START ON 03/24/2021] hydrocodone-acetaminophen, and [START ON 04/23/2021] hydrocodone-acetaminophen.  Pharmacotherapy (Medications Ordered): Meds ordered this encounter  Medications   HYDROcodone-acetaminophen (NORCO) 7.5-325 MG tablet    Sig: Take 1-2 tablets by mouth daily as needed for severe pain. Must last 30 days.    Dispense:  45 tablet    Refill:  0    Chronic Pain. (STOP Act -  Not applicable). Fill one day early if closed on scheduled refill date.   HYDROcodone-acetaminophen (NORCO) 7.5-325 MG tablet    Sig: Take 1-2 tablets by mouth daily as needed for severe pain. Must last 30 days.    Dispense:  45 tablet    Refill:  0    Chronic Pain. (STOP Act - Not applicable). Fill one day early if closed on scheduled refill date.   HYDROcodone-acetaminophen (NORCO) 7.5-325 MG tablet    Sig: Take 1-2 tablets by mouth daily as needed for severe pain. Must last 30 days.    Dispense:  45 tablet    Refill:  0    Chronic Pain. (STOP Act - Not applicable). Fill one day early if closed on scheduled refill date.   Continue Flexeril, nortriptyline, Pristiq as prescribed.  Follow-up plan:   Return in about 3  months (around 05/25/2021) for Medication Management, in person.   Recent Visits No visits were found meeting these conditions. Showing recent visits within past 90 days and meeting all other requirements Today's Visits Date Type Provider Dept  02/22/21 Office Visit [name] Santa, MD [facility]  Showing today's visits and meeting all other requirements Future Appointments Date Type Provider Dept  05/20/21 Appointment [name] Santa, MD [facility]  Showing future appointments within next 90 days and meeting all other requirements I discussed the assessment and treatment plan with the patient. The patient was provided an opportunity to ask questions and all were answered. The patient agreed with the plan and demonstrated an understanding of the instructions.  Patient advised to call back or seek an in-person evaluation if the symptoms or condition worsens.  Duration of encounter:30 minutes.  Note by: [name] Santa, MD Date: 02/22/2021; Time: 2:56 PM

## 2021-03-16 DIAGNOSIS — R300 Dysuria: Secondary | ICD-10-CM | POA: Diagnosis not present

## 2021-03-16 DIAGNOSIS — N390 Urinary tract infection, site not specified: Secondary | ICD-10-CM | POA: Diagnosis not present

## 2021-04-16 ENCOUNTER — Other Ambulatory Visit: Payer: Self-pay | Admitting: Student in an Organized Health Care Education/Training Program

## 2021-04-16 DIAGNOSIS — Q8501 Neurofibromatosis, type 1: Secondary | ICD-10-CM

## 2021-04-16 DIAGNOSIS — M545 Low back pain, unspecified: Secondary | ICD-10-CM

## 2021-04-16 DIAGNOSIS — G894 Chronic pain syndrome: Secondary | ICD-10-CM

## 2021-05-20 ENCOUNTER — Encounter: Payer: Medicare Other | Admitting: Student in an Organized Health Care Education/Training Program

## 2021-06-14 DIAGNOSIS — N390 Urinary tract infection, site not specified: Secondary | ICD-10-CM | POA: Diagnosis not present

## 2021-06-14 DIAGNOSIS — E785 Hyperlipidemia, unspecified: Secondary | ICD-10-CM | POA: Diagnosis not present

## 2021-06-14 DIAGNOSIS — N200 Calculus of kidney: Secondary | ICD-10-CM | POA: Diagnosis not present

## 2021-06-14 DIAGNOSIS — R300 Dysuria: Secondary | ICD-10-CM | POA: Diagnosis not present

## 2021-06-14 DIAGNOSIS — Q8509 Other neurofibromatosis: Secondary | ICD-10-CM | POA: Diagnosis not present

## 2021-06-14 DIAGNOSIS — E559 Vitamin D deficiency, unspecified: Secondary | ICD-10-CM | POA: Diagnosis not present

## 2021-06-14 DIAGNOSIS — I10 Essential (primary) hypertension: Secondary | ICD-10-CM | POA: Diagnosis not present

## 2021-06-14 DIAGNOSIS — R7303 Prediabetes: Secondary | ICD-10-CM | POA: Diagnosis not present

## 2021-06-17 ENCOUNTER — Ambulatory Visit
Payer: Medicare HMO | Attending: Student in an Organized Health Care Education/Training Program | Admitting: Student in an Organized Health Care Education/Training Program

## 2021-06-17 ENCOUNTER — Encounter: Payer: Self-pay | Admitting: Student in an Organized Health Care Education/Training Program

## 2021-06-17 ENCOUNTER — Other Ambulatory Visit: Payer: Self-pay

## 2021-06-17 VITALS — BP 119/87 | HR 72 | Temp 96.8°F | Resp 16 | Ht 61.0 in | Wt 145.0 lb

## 2021-06-17 DIAGNOSIS — M545 Low back pain, unspecified: Secondary | ICD-10-CM | POA: Insufficient documentation

## 2021-06-17 DIAGNOSIS — M546 Pain in thoracic spine: Secondary | ICD-10-CM | POA: Diagnosis not present

## 2021-06-17 DIAGNOSIS — Q8501 Neurofibromatosis, type 1: Secondary | ICD-10-CM | POA: Diagnosis not present

## 2021-06-17 DIAGNOSIS — G8929 Other chronic pain: Secondary | ICD-10-CM | POA: Insufficient documentation

## 2021-06-17 DIAGNOSIS — G894 Chronic pain syndrome: Secondary | ICD-10-CM | POA: Insufficient documentation

## 2021-06-17 DIAGNOSIS — M4127 Other idiopathic scoliosis, lumbosacral region: Secondary | ICD-10-CM | POA: Insufficient documentation

## 2021-06-17 DIAGNOSIS — Z0289 Encounter for other administrative examinations: Secondary | ICD-10-CM | POA: Diagnosis not present

## 2021-06-17 DIAGNOSIS — M792 Neuralgia and neuritis, unspecified: Secondary | ICD-10-CM | POA: Insufficient documentation

## 2021-06-17 DIAGNOSIS — R0789 Other chest pain: Secondary | ICD-10-CM | POA: Insufficient documentation

## 2021-06-17 MED ORDER — HYDROCODONE-ACETAMINOPHEN 7.5-325 MG PO TABS: 1.0000 | ORAL_TABLET | Freq: Every day | ORAL | 0 refills | Status: DC | PRN

## 2021-06-17 MED ORDER — CYCLOBENZAPRINE HCL 10 MG PO TABS: 10.0000 mg | ORAL_TABLET | Freq: Three times a day (TID) | ORAL | 3 refills | Status: DC | PRN

## 2021-06-17 NOTE — Progress Notes (Signed)
Nursing Pain Medication Assessment:  ?Safety precautions to be maintained throughout the outpatient stay will include: orient to surroundings, keep bed in low position, maintain call bell within reach at all times, provide assistance with transfer out of bed and ambulation.  ?Medication Inspection Compliance: Pill count conducted under aseptic conditions, in front of the patient. Neither the pills nor the bottle was removed from the patient's sight at any time. Once count was completed pills were immediately returned to the patient in their original bottle. ? ?Medication: Hydrocodone/APAP ?Pill/Patch Count:  0 of 45 pills remain ?Pill/Patch Appearance: Markings consistent with prescribed medication ?Bottle Appearance: Standard pharmacy container. Clearly labeled. ?Filled Date: 01 / 23 / [id number] ?Last Medication intake:   1 week ago ?

## 2021-06-17 NOTE — Progress Notes (Signed)
PROVIDER NOTE: Information contained herein reflects review and annotations entered in association with encounter. Interpretation of such information and data should be left to medically-trained personnel. Information provided to patient can be located elsewhere in the medical record under "Patient Instructions". Document created using STT-dictation technology, any transcriptional errors that may result from process are unintentional.    Patient: [name] [name]  Service Category: E/M  Provider: Gillis Santa, MD  DOB: 03/11/1971  DOS: 06/17/2021  Specialty: Interventional Pain Management  MRN: [id number]  Setting: Ambulatory outpatient  PCP: Perrin Maltese, MD  Type: Established Patient    Referring Provider: Perrin Maltese, MD  Location: Office  Delivery: Face-to-face     HPI  [name] [name], a 51 y.o. 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 Pain Assessment: Severity of   is reported as a 7 /10. Location: Back Mid/radiates into left arm and down to left leg sometimes. Onset: More than a month ago. Quality: Aching, Sharp, Stabbing. Timing: Constant. Modifying factor(s): med, lying down. Vitals:  height is '5\' 1"'  (1.549 m) and weight is 145 lb (65.8 kg). Her temperature is 96.8 F (36 C) (abnormal). Her blood pressure is 119/87 and her pulse is 72. Her respiration is 16 and oxygen saturation is 98%.   Reason for encounter: medication management.   Patient continues multimodal pain  regimen as prescribed.  States that it provides pain relief and improvement in functional status. No falls or visits to urgent care or emergency department   Pharmacotherapy Assessment  Analgesic: Hydrocodone 7.5 mg twice daily as needed, quantity 45/month; MME equals 15    Monitoring: [place] PMP: PDMP reviewed during this encounter.       Pharmacotherapy: No side-effects or adverse reactions reported. Compliance: No problems identified. Effectiveness: Clinically acceptable.  UDS:  Summary  Date Value Ref Range Status  05/14/2020 Note  Final    Comment:    ==================================================================== [employer]ox[employer] Select 13 (MW) ==================================================================== Test                             Result       Flag       Units  Drug Present and Declared for Prescription Verification   Hydrocodone                    374          EXPECTED   ng/mg creat   Hydromorphone                  78           EXPECTED   ng/mg creat   Dihydrocodeine                 49           EXPECTED   ng/mg creat   Norhydrocodone                 965          EXPECTED   ng/mg  creat    Sources of hydrocodone include scheduled prescription medications.    Hydromorphone, dihydrocodeine and norhydrocodone are expected    metabolites of hydrocodone. Hydromorphone and dihydrocodeine are    also available as scheduled prescription medications.  ==================================================================== Test                      Result    Flag   Units      Ref Range   Creatinine              299              mg/dL      >=20 ==================================================================== Declared Medications:  The flagging and interpretation on this report are based on the  following declared medications.  Unexpected results may arise from  inaccuracies in the declared medications.   **Note: The testing scope of this panel includes these  medications:   Hydrocodone (Norco)   **Note: The testing scope of this panel does not include the  following reported medications:   Acetaminophen (Tylenol)  Acetaminophen (Norco)  Albuterol (Ventolin HFA)  Amitriptyline (Elavil)  Baclofen (Lioresal)  Gabapentin (Neurontin)  Metoprolol (Lopressor)  Nortriptyline (Pamelor)  Pantoprazole (Protonix)  Sumatriptan (Imitrex) ==================================================================== For clinical consultation, please call [phone]. ====================================================================       ROS  Constitutional: Denies any fever or chills Gastrointestinal: No reported hemesis, hematochezia, vomiting, or acute GI distress Musculoskeletal:  Low back, bilateral hip pain Neurological: No reported episodes of acute onset apraxia, aphasia, dysarthria, agnosia, amnesia, paralysis, loss of coordination, or loss of consciousness  Medication Review  HYDROcodone-acetaminophen, SUMAtriptan, acetaminophen, albuterol, cyclobenzaprine, desvenlafaxine, ergocalciferol, gabapentin, ketorolac, metoprolol succinate, nortriptyline, ondansetron, oxybutynin, pantoprazole, phenazopyridine, and tamsulosin  History Review  Allergy: [name] [name] is allergic to peanuts [peanut oil] and penicillins. Drug: [name] [name]  reports no history of drug use. Alcohol:  reports no history of alcohol use. Tobacco:  reports that she has never smoked. She has never used smokeless tobacco. Social: [name] [name]  reports that she has never smoked. She has never used smokeless tobacco. She reports that she does not drink alcohol and does not use drugs. Medical:  has a past medical history of Hypertension, Neurofibromatosis ([place]), Renal disorder, Scoliosis of thoracic spine, and Thyroid disease. Surgical: [name] [name]  has a past surgical history that includes Cesarean section; chamberlain procedure; and Thyroidectomy. Family: family history includes Cancer  in her mother.  Laboratory Chemistry Profile   Renal Lab Results  Component Value Date   BUN 15 10/31/2020   CREATININE 0.82 10/31/2020   GFRAA >60 05/24/2018   GFRNONAA >60 10/31/2020     Hepatic Lab Results  Component Value Date   AST 19 10/31/2020   ALT 12 10/31/2020   ALBUMIN 4.2 10/31/2020   ALKPHOS 106 10/31/2020   LIPASE 28 05/24/2018     Electrolytes Lab Results  Component Value Date   NA 138 10/31/2020   K 3.6 10/31/2020   CL 106 10/31/2020   CALCIUM 9.3 10/31/2020     Bone No results found for: VD25OH, VD125OH2TOT, YD7412IN8, MV6720NO7, 25OHVITD1, 25OHVITD2, 25OHVITD3, TESTOFREE, TESTOSTERONE   Inflammation (CRP: Acute Phase) (ESR: Chronic Phase) No results found for: CRP, ESRSEDRATE, LATICACIDVEN     Note: Above Lab results reviewed.  Recent Imaging Review  MM 3D SCREEN BREAST BILATERAL CLINICAL DATA:  Screening.  EXAM: DIGITAL SCREENING BILATERAL MAMMOGRAM WITH TOMOSYNTHESIS AND CAD  TECHNIQUE: Bilateral screening digital craniocaudal and mediolateral oblique mammograms were obtained. Bilateral screening digital breast tomosynthesis  was performed. The images were evaluated with computer-aided detection.  COMPARISON:  Previous exam(s).  ACR Breast Density Category b: There are scattered areas of fibroglandular density.  FINDINGS: There are no findings suspicious for malignancy. The images were evaluated with computer-aided detection.  IMPRESSION: No mammographic evidence of malignancy. A result letter of this screening mammogram will be mailed directly to the patient.  RECOMMENDATION: Screening mammogram in one year. (Code:SM-B-01Y)  BI-RADS CATEGORY  1: Negative.  Electronically Signed   By: Lillia Mountain M.D.   On: 06/29/2020 13:06  Note: Reviewed        Physical Exam  General appearance: Well nourished, well developed, and well hydrated. In no apparent acute distress Mental status: Alert, oriented x 3 (person, place, & time)        Respiratory: No evidence of acute respiratory distress Eyes: PERLA Vitals: BP 119/87    Pulse 72    Temp (!) 96.8 F (36 C)    Resp 16    Ht '5\' 1"'  (1.549 m)    Wt 145 lb (65.8 kg)    SpO2 98%    BMI 27.40 kg/m  BMI: Estimated body mass index is 27.4 kg/m as calculated from the following:   Height as of this encounter: '5\' 1"'  (1.549 m).   Weight as of this encounter: 145 lb (65.8 kg). Ideal: Ideal body weight: 47.8 kg (105 lb 6.1 oz) Adjusted ideal body weight: 55 kg (121 lb 3.7 oz)  +low back pain, bilateral hip pain  5 out of 5 strength bilateral lower extremity: Plantar flexion, dorsiflexion, knee flexion, knee extension.   Assessment   Status Diagnosis  Controlled Controlled Controlled 1. Neurofibromatosis, type 1 (von Recklinghausen's disease) ([place])   2. Neuropathic pain   3. Back pain of thoracolumbar region   4. Other idiopathic scoliosis, lumbosacral region   5. Thoracic spine pain   6. Chest wall pain, chronic   7. Pain medication agreement signed   8. Chronic pain syndrome         Plan of Care   [name] [name] has a current medication list which includes the following long-term medication(s): albuterol, desvenlafaxine, gabapentin, metoprolol succinate, pantoprazole, sumatriptan, hydrocodone-acetaminophen, [START ON 07/17/2021] hydrocodone-acetaminophen, and [START ON 08/16/2021] hydrocodone-acetaminophen.  Pharmacotherapy (Medications Ordered): Meds ordered this encounter  Medications   HYDROcodone-acetaminophen (NORCO) 7.5-325 MG tablet    Sig: Take 1-2 tablets by mouth daily as needed for severe pain. Must last 30 days.    Dispense:  45 tablet    Refill:  0    Chronic Pain. (STOP Act - Not applicable). Fill one day early if closed on scheduled refill date.   HYDROcodone-acetaminophen (NORCO) 7.5-325 MG tablet    Sig: Take 1-2 tablets by mouth daily as needed for severe pain. Must last 30 days.    Dispense:  45 tablet    Refill:  0    Chronic Pain. (STOP  Act - Not applicable). Fill one day early if closed on scheduled refill date.   HYDROcodone-acetaminophen (NORCO) 7.5-325 MG tablet    Sig: Take 1-2 tablets by mouth daily as needed for severe pain. Must last 30 days.    Dispense:  45 tablet    Refill:  0    Chronic Pain. (STOP Act - Not applicable). Fill one day early if closed on scheduled refill date.   cyclobenzaprine (FLEXERIL) 10 MG tablet    Sig: Take 1 tablet (10 mg total) by mouth 3 (three) times daily as needed for muscle  spasms.    Dispense:  90 tablet    Refill:  3    Do not place this medication, or any other prescription from our practice, on "Automatic Refill". Patient may have prescription filled one day early if pharmacy is closed on scheduled refill date.    Orders Placed This Encounter  Procedures   ToxASSURE Select 13 (MW), Urine    Volume: 30 ml(s). Minimum 3 ml of urine is needed. Document temperature of fresh sample. Indications: Long term (current) use of opiate analgesic [phone])    Order Specific Question:   Release to patient    Answer:   Immediate     Follow-up plan:   Return in about 3 months (around 09/17/2021) for Medication Management, in person.   Recent Visits No visits were found meeting these conditions. Showing recent visits within past 90 days and meeting all other requirements Today's Visits Date Type Provider Dept  06/17/21 Office Visit Gillis Santa, MD [facility]  Showing today's visits and meeting all other requirements Future Appointments Date Type Provider Dept  09/14/21 Appointment Gillis Santa, MD [facility]  Showing future appointments within next 90 days and meeting all other requirements  I discussed the assessment and treatment plan with the patient. The patient was provided an opportunity to ask questions and all were answered. The patient agreed with the plan and demonstrated an understanding of the instructions.  Patient advised to call back or seek an  in-person evaluation if the symptoms or condition worsens.  Duration of encounter:30 minutes.  Note by: Gillis Santa, MD Date: 06/17/2021; Time: 1:16 PM

## 2021-06-22 LAB — TOXASSURE SELECT 13 (MW), URINE

## 2021-09-14 ENCOUNTER — Ambulatory Visit
Payer: Medicare HMO | Attending: Student in an Organized Health Care Education/Training Program | Admitting: Student in an Organized Health Care Education/Training Program

## 2021-09-14 ENCOUNTER — Encounter: Payer: Self-pay | Admitting: Student in an Organized Health Care Education/Training Program

## 2021-09-14 DIAGNOSIS — M545 Low back pain, unspecified: Secondary | ICD-10-CM | POA: Insufficient documentation

## 2021-09-14 DIAGNOSIS — M792 Neuralgia and neuritis, unspecified: Secondary | ICD-10-CM | POA: Insufficient documentation

## 2021-09-14 DIAGNOSIS — M546 Pain in thoracic spine: Secondary | ICD-10-CM | POA: Diagnosis not present

## 2021-09-14 DIAGNOSIS — Q8501 Neurofibromatosis, type 1: Secondary | ICD-10-CM | POA: Insufficient documentation

## 2021-09-14 DIAGNOSIS — G894 Chronic pain syndrome: Secondary | ICD-10-CM | POA: Diagnosis not present

## 2021-09-14 MED ORDER — HYDROCODONE-ACETAMINOPHEN 7.5-325 MG PO TABS: 1.0000 | ORAL_TABLET | Freq: Every day | ORAL | 0 refills | Status: DC | PRN

## 2021-09-14 MED ORDER — GABAPENTIN 600 MG PO TABS: 600.0000 mg | ORAL_TABLET | Freq: Four times a day (QID) | ORAL | 5 refills | Status: DC

## 2021-09-14 MED ORDER — CYCLOBENZAPRINE HCL 10 MG PO TABS: 10.0000 mg | ORAL_TABLET | Freq: Three times a day (TID) | ORAL | 5 refills | Status: DC | PRN

## 2021-09-14 NOTE — Progress Notes (Signed)
PROVIDER NOTE: Information contained herein reflects review and annotations entered in association with encounter. Interpretation of such information and data should be left to medically-trained personnel. Information provided to patient can be located elsewhere in the medical record under "Patient Instructions". Document created using STT-dictation technology, any transcriptional errors that may result from process are unintentional.    Patient: [name] [name]  Service Category: E/M  Provider: Gillis Santa, MD  DOB: 1970-11-07  DOS: 09/14/2021  Specialty: Interventional Pain Management  MRN: [id number]  Setting: Ambulatory outpatient  PCP: Perrin Maltese, MD  Type: Established Patient    Referring Provider: Perrin Maltese, MD  Location: Office  Delivery: Face-to-face     HPI  [name], a 51 y.o. year old female, is here today because of her No primary diagnosis found.. Ms. Lindo's primary complain today is Back Pain (thoracic)  Last encounter: My last encounter with her was on 06/17/21  Pertinent problems: Ms. 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 Pain Assessment: Severity of Chronic pain is reported as a 5 /10. Location: Back Lower/radiates around to chest and under right arm. Onset: More than a month ago. Quality: Sharp (electric feeling). Timing: Intermittent. Modifying factor(s): meds, rest. Vitals:  height is '5\' 1"'  (1.549 m) and weight is 145 lb (65.8 kg). Her temperature is 97.5 F (36.4 C) (abnormal). Her blood pressure is 124/94 (abnormal) and her pulse is 97. Her respiration is 16 and oxygen saturation is 100%.   Reason for encounter: medication management.   Patient continues multimodal pain regimen as prescribed.   States that it provides pain relief and improvement in functional status. No falls or visits to urgent care or emergency department   Pharmacotherapy Assessment  Analgesic: Hydrocodone 7.5 mg twice daily as needed, quantity 45/month; MME equals 15    Monitoring: [place] PMP: PDMP reviewed during this encounter.       Pharmacotherapy: No side-effects or adverse reactions reported. Compliance: No problems identified. Effectiveness: Clinically acceptable.  UDS:  Summary  Date Value Ref Range Status  06/17/2021 Note  Final    Comment:    ==================================================================== Tox[employer] Select 13 (MW) ==================================================================== Test                             Result       Flag       Units  Drug Absent but Declared for Prescription Verification   Hydrocodone                    Not Detected UNEXPECTED ng/mg creat ==================================================================== Test                      Result    Flag   Units      Ref Range   Creatinine              216              mg/dL      >=20 ==================================================================== Declared Medications:  The flagging and interpretation on this report are based on the  following declared medications.  Unexpected results may arise from  inaccuracies in the declared medications.   **Note: The testing scope of this panel includes these medications:  Hydrocodone (Norco)   **Note: The testing scope of this panel does not include the  following reported medications:   Acetaminophen (Tylenol)  Acetaminophen (Norco)  Albuterol  Cyclobenzaprine (Flexeril)  Desvenlafaxine (Pristiq)  Gabapentin (Neurontin)  Ketorolac (Toradol)  Nortriptyline (Pamelor)  Ondansetron (Zofran)  Oxybutynin  Pantoprazole (Protonix)  Phenazopyridine (Pyridium)  Sumatriptan (Imitrex)  Tamsulosin (Flomax)  Vitamin  D2 ==================================================================== For clinical consultation, please call [phone]. ====================================================================       ROS  Constitutional: Denies any fever or chills Gastrointestinal: No reported hemesis, hematochezia, vomiting, or acute GI distress Musculoskeletal:  Low back, bilateral hip pain Neurological: No reported episodes of acute onset apraxia, aphasia, dysarthria, agnosia, amnesia, paralysis, loss of coordination, or loss of consciousness  Medication Review  HYDROcodone-acetaminophen, SUMAtriptan, acetaminophen, albuterol, cyclobenzaprine, desvenlafaxine, ergocalciferol, gabapentin, ketorolac, metoprolol succinate, nortriptyline, ondansetron, oxybutynin, pantoprazole, phenazopyridine, and tamsulosin  History Review  Allergy: Ms. Currin is allergic to peanuts [peanut oil] and penicillins. Drug: Ms. Vetsch  reports no history of drug use. Alcohol:  reports no history of alcohol use. Tobacco:  reports that she has never smoked. She has never used smokeless tobacco. Social: Ms. Waszak  reports that she has never smoked. She has never used smokeless tobacco. She reports that she does not drink alcohol and does not use drugs. Medical:  has a past medical history of Hypertension, Neurofibromatosis ([place]), Renal disorder, Scoliosis of thoracic spine, and Thyroid disease. Surgical: Ms. Salas  has a past surgical history that includes Cesarean section; chamberlain procedure; and Thyroidectomy. Family: family history includes Cancer in her mother.  Laboratory Chemistry Profile   Renal Lab Results  Component Value Date   BUN 15 10/31/2020   CREATININE 0.82 10/31/2020   GFRAA >60 05/24/2018   GFRNONAA >60 10/31/2020     Hepatic Lab Results  Component Value Date   AST 19 10/31/2020   ALT 12 10/31/2020   ALBUMIN 4.2 10/31/2020   ALKPHOS 106 10/31/2020   LIPASE 28 05/24/2018      Electrolytes Lab Results  Component Value Date   NA 138 10/31/2020   K 3.6 10/31/2020   CL 106 10/31/2020   CALCIUM 9.3 10/31/2020     Bone No results found for: VD25OH, VD125OH2TOT, KD3267TI4, PY0998PJ8, 25OHVITD1, 25OHVITD2, 25OHVITD3, TESTOFREE, TESTOSTERONE   Inflammation (CRP: Acute Phase) (ESR: Chronic Phase) No results found for: CRP, ESRSEDRATE, LATICACIDVEN     Note: Above Lab results reviewed.  Recent Imaging Review  MM 3D SCREEN BREAST BILATERAL CLINICAL DATA:  Screening.  EXAM: DIGITAL SCREENING BILATERAL MAMMOGRAM WITH TOMOSYNTHESIS AND CAD  TECHNIQUE: Bilateral screening digital craniocaudal and mediolateral oblique mammograms were obtained. Bilateral screening digital breast tomosynthesis was performed. The images were evaluated with computer-aided detection.  COMPARISON:  Previous exam(s).  ACR Breast Density Category b: There are scattered areas of fibroglandular density.  FINDINGS: There are no findings suspicious for malignancy. The images were evaluated with computer-aided detection.  IMPRESSION: No mammographic evidence of malignancy. A result letter of this screening mammogram will be mailed directly to the patient.  RECOMMENDATION: Screening mammogram in one year. (Code:SM-B-01Y)  BI-RADS CATEGORY  1: Negative.  Electronically Signed   By: Lillia Mountain M.D.   On: 06/29/2020 13:06  Note: Reviewed        Physical Exam  General appearance: Well nourished, well developed, and well hydrated. In no apparent acute distress Mental status: Alert, oriented x 3 (person, place, & time)       Respiratory: No evidence of acute respiratory distress Eyes: PERLA Vitals: BP Marland Kitchen)  124/94   Pulse 97   Temp (!) 97.5 F (36.4 C)   Resp 16   Ht '5\' 1"'  (1.549 m)   Wt 145 lb (65.8 kg)   SpO2 100%   BMI 27.40 kg/m  BMI: Estimated body mass index is 27.4 kg/m as calculated from the following:   Height as of this encounter: '5\' 1"'  (1.549 m).   Weight as  of this encounter: 145 lb (65.8 kg). Ideal: Ideal body weight: 47.8 kg (105 lb 6.1 oz) Adjusted ideal body weight: 55 kg (121 lb 3.7 oz)  +low back pain, bilateral hip pain  5 out of 5 strength bilateral lower extremity: Plantar flexion, dorsiflexion, knee flexion, knee extension.   Assessment   Status Diagnosis  Controlled Controlled Controlled 1. 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 Neuropathic pain   3. Chronic pain syndrome   4. Back pain of thoracolumbar region         Plan of Care   Ms. Lonnetta L Esquer has a current medication list which includes the following long-term medication(s): albuterol, desvenlafaxine, metoprolol succinate, pantoprazole, sumatriptan, gabapentin, [START ON 09/17/2021] hydrocodone-acetaminophen, [START ON 10/17/2021] hydrocodone-acetaminophen, and [START ON 11/16/2021] hydrocodone-acetaminophen.  Pharmacotherapy (Medications Ordered): Meds ordered this encounter  Medications   HYDROcodone-acetaminophen (NORCO) 7.5-325 MG tablet    Sig: Take 1-2 tablets by mouth daily as needed for severe pain. Must last 30 days.    Dispense:  45 tablet    Refill:  0    Chronic Pain. (STOP Act - Not applicable). Fill one day early if closed on scheduled refill date.   HYDROcodone-acetaminophen (NORCO) 7.5-325 MG tablet    Sig: Take 1-2 tablets by mouth daily as needed for severe pain. Must last 30 days.    Dispense:  45 tablet    Refill:  0    Chronic Pain. (STOP Act - Not applicable). Fill one day early if closed on scheduled refill date.   HYDROcodone-acetaminophen (NORCO) 7.5-325 MG tablet    Sig: Take 1-2 tablets by mouth daily as needed for severe pain. Must last 30 days.    Dispense:  45 tablet    Refill:  0    Chronic Pain. (STOP Act - Not applicable). Fill one day early if closed on scheduled refill date.   cyclobenzaprine (FLEXERIL) 10 MG tablet    Sig: Take 1 tablet (10 mg total) by mouth 3 (three) times daily as needed for  muscle spasms.    Dispense:  90 tablet    Refill:  5    Do not place this medication, or any other prescription from our practice, on "Automatic Refill". Patient may have prescription filled one day early if pharmacy is closed on scheduled refill date.   gabapentin (NEURONTIN) 600 MG tablet    Sig: Take 1 tablet (600 mg total) by mouth 4 (four) times daily.    Dispense:  120 tablet    Refill:  5    No orders of the defined types were placed in this encounter.    Follow-up plan:   Return in about 3 months (around 12/15/2021) for Medication Management, in person.   Recent Visits Date Type Provider Dept  06/17/21 Office Visit Gillis Santa, MD [facility]  Showing recent visits within past 90 days and meeting all other requirements Today's Visits Date Type Provider Dept  09/14/21 Office Visit Gillis Santa, MD [facility]  Showing today's visits and meeting all other requirements Future Appointments No visits were found  meeting these conditions. Showing future appointments within next 90 days and meeting all other requirements  I discussed the assessment and treatment plan with the patient. The patient was provided an opportunity to ask questions and all were answered. The patient agreed with the plan and demonstrated an understanding of the instructions.  Patient advised to call back or seek an in-person evaluation if the symptoms or condition worsens.  Duration of encounter:30 minutes.  Note by: Gillis Santa, MD Date: 09/14/2021; Time: 11:05 AM

## 2021-09-14 NOTE — Progress Notes (Signed)
Nursing Pain Medication Assessment:  Safety precautions to be maintained throughout the outpatient stay will include: orient to surroundings, keep bed in low position, maintain call bell within reach at all times, provide assistance with transfer out of bed and ambulation.  Medication Inspection Compliance: Pill count conducted under aseptic conditions, in front of the patient. Neither the pills nor the bottle was removed from the patient's sight at any time. Once count was completed pills were immediately returned to the patient in their original bottle.  Medication: See above Pill/Patch Count:  5 of 45 pills remain Pill/Patch Appearance: Markings consistent with prescribed medication Bottle Appearance: Standard pharmacy container. Clearly labeled. Filled Date: 05 / 10 / 2023 Last Medication intake:  Yesterday

## 2021-10-08 DIAGNOSIS — G894 Chronic pain syndrome: Secondary | ICD-10-CM | POA: Diagnosis not present

## 2021-10-08 DIAGNOSIS — E785 Hyperlipidemia, unspecified: Secondary | ICD-10-CM | POA: Diagnosis not present

## 2021-10-08 DIAGNOSIS — I10 Essential (primary) hypertension: Secondary | ICD-10-CM | POA: Diagnosis not present

## 2021-12-16 ENCOUNTER — Ambulatory Visit
Payer: Medicare HMO | Attending: Student in an Organized Health Care Education/Training Program | Admitting: Student in an Organized Health Care Education/Training Program

## 2021-12-16 ENCOUNTER — Encounter: Payer: Self-pay | Admitting: Student in an Organized Health Care Education/Training Program

## 2021-12-16 VITALS — BP 126/90 | HR 127 | Temp 97.2°F | Resp 18 | Ht 61.0 in | Wt 145.0 lb

## 2021-12-16 DIAGNOSIS — G894 Chronic pain syndrome: Secondary | ICD-10-CM | POA: Diagnosis not present

## 2021-12-16 DIAGNOSIS — Q8501 Neurofibromatosis, type 1: Secondary | ICD-10-CM | POA: Insufficient documentation

## 2021-12-16 DIAGNOSIS — M792 Neuralgia and neuritis, unspecified: Secondary | ICD-10-CM | POA: Diagnosis not present

## 2021-12-16 DIAGNOSIS — M545 Low back pain, unspecified: Secondary | ICD-10-CM | POA: Diagnosis not present

## 2021-12-16 DIAGNOSIS — M546 Pain in thoracic spine: Secondary | ICD-10-CM | POA: Diagnosis not present

## 2021-12-16 DIAGNOSIS — M4127 Other idiopathic scoliosis, lumbosacral region: Secondary | ICD-10-CM | POA: Insufficient documentation

## 2021-12-16 MED ORDER — HYDROCODONE-ACETAMINOPHEN 7.5-325 MG PO TABS: 1.0000 | ORAL_TABLET | Freq: Every day | ORAL | 0 refills | Status: DC | PRN

## 2021-12-16 NOTE — Progress Notes (Signed)
PROVIDER NOTE: Information contained herein reflects review and annotations entered in association with encounter. Interpretation of such information and data should be left to medically-trained personnel. Information provided to patient can be located elsewhere in the medical record under "Patient Instructions". Document created using STT-dictation technology, any transcriptional errors that may result from process are unintentional.    Patient: [name] [name]  Service Category: E/M  Provider: Gillis Santa, MD  DOB: 24-Jul-1970  DOS: 12/16/2021  Specialty: Interventional Pain Management  MRN: [id number]  Setting: Ambulatory outpatient  PCP: Perrin Maltese, MD  Type: Established Patient    Referring Provider: Perrin Maltese, MD  Location: Office  Delivery: Face-to-face     HPI  [name], a 51 y.o. 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 Ms. Potenza's primary complain today is Back Pain (Upper and lower)  Last encounter: My last encounter with her was on 06/17/21  Pertinent problems: Ms. Loflin has Back pain of thoracolumbar region; Neurofibromatosis, type 1 (von Recklinghausen's disease) ([place]); Idiopathic scoliosis; Chest wall pain, chronic; Thoracic spine pain; Neurofibroma; Neuropathic pain; Neurofibromatosis, type 1 ([place]); and Chronic pain syndrome on their pertinent problem list. Pain Assessment: Severity of Chronic pain is reported as a 5 /10. Location: Back Lower/radiates down left leg on the outside to knee. Onset: More than a month ago. Quality: Stabbing, Shooting. Timing: Constant. Modifying factor(s): meds. Vitals:  height is _0  (1.549 m) and weight is 145 lb (65.8 kg). Her temperature is 97.2 F (36.2 C) (abnormal). Her blood pressure is 126/90 (abnormal) and her pulse is 127 (abnormal). Her respiration is 18 and oxygen saturation is 100%.   Reason for encounter: medication management.   Patient  continues multimodal pain regimen as prescribed.  States that it provides pain relief and improvement in functional status. No falls or visits to urgent care or emergency department   Pharmacotherapy Assessment  Analgesic: Hydrocodone 7.5 mg twice daily as needed, quantity 45/month; MME equals 15    Monitoring: [place] PMP: PDMP not reviewed this encounter.       Pharmacotherapy: No side-effects or adverse reactions reported. Compliance: No problems identified. Effectiveness: Clinically acceptable.  UDS:  Summary  Date Value Ref Range Status  06/17/2021 Note  Final    Comment:    ==================================================================== Tox[employer]SURE Select 13 (MW) ==================================================================== Test                             Result       Flag       Units  Drug Absent but Declared for Prescription Verification   Hydrocodone                    Not Detected UNEXPECTED ng/mg creat ==================================================================== Test                      Result    Flag   Units      Ref Range   Creatinine              216              mg/dL      >=20 ==================================================================== Declared Medications:  The flagging and interpretation on this report are based on the  following declared medications.  Unexpected results may arise from  inaccuracies in the declared medications.   **Note: The testing scope of this  panel includes these medications:   Hydrocodone (Norco)   **Note: The testing scope of this panel does not include the  following reported medications:   Acetaminophen (Tylenol)  Acetaminophen (Norco)  Albuterol  Cyclobenzaprine (Flexeril)  Desvenlafaxine (Pristiq)  Gabapentin (Neurontin)  Ketorolac (Toradol)  Nortriptyline (Pamelor)  Ondansetron (Zofran)  Oxybutynin  Pantoprazole (Protonix)  Phenazopyridine (Pyridium)  Sumatriptan (Imitrex)  Tamsulosin (Flomax)   Vitamin D2 ==================================================================== For clinical consultation, please call [phone]. ====================================================================       ROS  Constitutional: Denies any fever or chills Gastrointestinal: No reported hemesis, hematochezia, vomiting, or acute GI distress Musculoskeletal:  Low back, bilateral hip pain Neurological: No reported episodes of acute onset apraxia, aphasia, dysarthria, agnosia, amnesia, paralysis, loss of coordination, or loss of consciousness  Medication Review  HYDROcodone-acetaminophen, SUMAtriptan, acetaminophen, albuterol, cyclobenzaprine, desvenlafaxine, ergocalciferol, gabapentin, ketorolac, metoprolol succinate, nortriptyline, ondansetron, oxybutynin, pantoprazole, phenazopyridine, and tamsulosin  History Review  Allergy: Ms. Ebrahimi is allergic to peanuts [peanut oil] and penicillins. Drug: Ms. Weinhold  reports no history of drug use. Alcohol:  reports no history of alcohol use. Tobacco:  reports that she has never smoked. She has never used smokeless tobacco. Social: Ms. Balentine  reports that she has never smoked. She has never used smokeless tobacco. She reports that she does not drink alcohol and does not use drugs. Medical:  has a past medical history of Hypertension, Neurofibromatosis ([place]), Renal disorder, Scoliosis of thoracic spine, and Thyroid disease. Surgical: Ms. Rizo  has a past surgical history that includes Cesarean section; chamberlain procedure; and Thyroidectomy. Family: family history includes Cancer in her mother.  Laboratory Chemistry Profile   Renal Lab Results  Component Value Date   BUN 15 10/31/2020   CREATININE 0.82 10/31/2020   GFRAA >60 05/24/2018   GFRNONAA >60 10/31/2020     Hepatic Lab Results  Component Value Date   AST 19 10/31/2020   ALT 12 10/31/2020   ALBUMIN 4.2 10/31/2020   ALKPHOS 106 10/31/2020   LIPASE 28 05/24/2018      Electrolytes Lab Results  Component Value Date   NA 138 10/31/2020   K 3.6 10/31/2020   CL 106 10/31/2020   CALCIUM 9.3 10/31/2020     Bone No results found for: "VD25OH", "VD125OH2TOT", "TW6568LE7", "NT7001VC9", "25OHVITD1", "25OHVITD2", "25OHVITD3", "TESTOFREE", "TESTOSTERONE"   Inflammation (CRP: Acute Phase) (ESR: Chronic Phase) No results found for: "CRP", "ESRSEDRATE", "LATICACIDVEN"     Note: Above Lab results reviewed.  Recent Imaging Review  MM 3D SCREEN BREAST BILATERAL CLINICAL DATA:  Screening.  EXAM: DIGITAL SCREENING BILATERAL MAMMOGRAM WITH TOMOSYNTHESIS AND CAD  TECHNIQUE: Bilateral screening digital craniocaudal and mediolateral oblique mammograms were obtained. Bilateral screening digital breast tomosynthesis was performed. The images were evaluated with computer-aided detection.  COMPARISON:  Previous exam(s).  ACR Breast Density Category b: There are scattered areas of fibroglandular density.  FINDINGS: There are no findings suspicious for malignancy. The images were evaluated with computer-aided detection.  IMPRESSION: No mammographic evidence of malignancy. A result letter of this screening mammogram will be mailed directly to the patient.  RECOMMENDATION: Screening mammogram in one year. (Code:SM-B-01Y)  BI-RADS CATEGORY  1: Negative.  Electronically Signed   By: Lillia Mountain M.D.   On: 06/29/2020 13:06  Note: Reviewed        Physical Exam  General appearance: Well nourished, well developed, and well hydrated. In no apparent acute distress Mental status: Alert, oriented x 3 (person, place, & time)       Respiratory: No evidence of acute respiratory  distress Eyes: PERLA Vitals: BP (!) 126/90   Pulse (!) 127   Temp (!) 97.2 F (36.2 C)   Resp 18   Ht _0  (1.549 m)   Wt 145 lb (65.8 kg)   SpO2 100%   BMI 27.40 kg/m  BMI: Estimated body mass index is 27.4 kg/m as calculated from the following:   Height as of this encounter:  _1  (1.549 m).   Weight as of this encounter: 145 lb (65.8 kg). Ideal: Ideal body weight: 47.8 kg (105 lb 6.1 oz) Adjusted ideal body weight: 55 kg (121 lb 3.7 oz)  +low back pain, bilateral hip pain  5 out of 5 strength bilateral lower extremity: Plantar flexion, dorsiflexion, knee flexion, knee extension.   Assessment   Status Diagnosis  Controlled Controlled Controlled 1. Neurofibromatosis, type 1 (von Recklinghausen's disease) ([place])   2. Neuropathic pain   3. Back pain of thoracolumbar region   4. Other idiopathic scoliosis, lumbosacral region   5. Thoracic spine pain   6. Chronic pain syndrome         Plan of Care   Ms. Ekaterini L Balles has a current medication list which includes the following long-term medication(s): albuterol, desvenlafaxine, gabapentin, metoprolol succinate, pantoprazole, sumatriptan, [START ON 12/19/2021] hydrocodone-acetaminophen, [START ON 01/18/2022] hydrocodone-acetaminophen, and [START ON 02/17/2022] hydrocodone-acetaminophen.  Pharmacotherapy (Medications Ordered): Meds ordered this encounter  Medications   HYDROcodone-acetaminophen (NORCO) 7.5-325 MG tablet    Sig: Take 1-2 tablets by mouth daily as needed for severe pain. Must last 30 days.    Dispense:  45 tablet    Refill:  0    Chronic Pain. (STOP Act - Not applicable). Fill one day early if closed on scheduled refill date.   HYDROcodone-acetaminophen (NORCO) 7.5-325 MG tablet    Sig: Take 1-2 tablets by mouth daily as needed for severe pain. Must last 30 days.    Dispense:  45 tablet    Refill:  0    Chronic Pain. (STOP Act - Not applicable). Fill one day early if closed on scheduled refill date.   HYDROcodone-acetaminophen (NORCO) 7.5-325 MG tablet    Sig: Take 1-2 tablets by mouth daily as needed for severe pain. Must last 30 days.    Dispense:  45 tablet    Refill:  0    Chronic Pain. (STOP Act - Not applicable). Fill one day early if closed on scheduled refill date.    Orders  Placed This Encounter  Procedures   ToxASSURE Select 13 (MW), Urine    Volume: 30 ml(s). Minimum 3 ml of urine is needed. Document temperature of fresh sample. Indications: Long term (current) use of opiate analgesic [phone])    Order Specific Question:   Release to patient    Answer:   Immediate     Follow-up plan:   Return in about 3 months (around 03/08/2022) for Medication Management, in person.   Recent Visits No visits were found meeting these conditions. Showing recent visits within past 90 days and meeting all other requirements Today's Visits Date Type Provider Dept  12/16/21 Office Visit Gillis Santa, MD [facility]  Showing today's visits and meeting all other requirements Future Appointments Date Type Provider Dept  03/08/22 Appointment Gillis Santa, MD [facility]  Showing future appointments within next 90 days and meeting all other requirements  I discussed the assessment and treatment plan with the patient. The patient was provided an opportunity to ask questions and all were answered. The patient agreed with  the plan and demonstrated an understanding of the instructions.  Patient advised to call back or seek an in-person evaluation if the symptoms or condition worsens.  Duration of encounter:30 minutes.  Note by: Gillis Santa, MD Date: 12/16/2021; Time: 10:22 AM

## 2021-12-16 NOTE — Progress Notes (Signed)
Nursing Pain Medication Assessment:  Safety precautions to be maintained throughout the outpatient stay will include: orient to surroundings, keep bed in low position, maintain call bell within reach at all times, provide assistance with transfer out of bed and ambulation.  Medication Inspection Compliance: Pill count conducted under aseptic conditions, in front of the patient. Neither the pills nor the bottle was removed from the patient's sight at any time. Once count was completed pills were immediately returned to the patient in their original bottle.  Medication: Hydrocodone/APAP Pill/Patch Count:  3 of 45 pills remain Pill/Patch Appearance: Markings consistent with prescribed medication Bottle Appearance: Standard pharmacy container. Clearly labeled. Filled Date: 08 / 10 / 2023 Last Medication intake:  Yesterday

## 2021-12-19 LAB — TOXASSURE SELECT 13 (MW), URINE

## 2022-01-16 ENCOUNTER — Emergency Department
Admission: EM | Admit: 2022-01-16 | Discharge: 2022-01-16 | Discharge status: Against Medical Advice | Payer: Medicare HMO | Attending: Student | Admitting: Student

## 2022-01-16 ENCOUNTER — Other Ambulatory Visit: Payer: Self-pay

## 2022-01-16 DIAGNOSIS — R1031 Right lower quadrant pain: Secondary | ICD-10-CM | POA: Diagnosis not present

## 2022-01-16 DIAGNOSIS — Z5321 Procedure and treatment not carried out due to patient leaving prior to being seen by health care provider: Secondary | ICD-10-CM | POA: Diagnosis not present

## 2022-01-16 DIAGNOSIS — M791 Myalgia, unspecified site: Secondary | ICD-10-CM | POA: Insufficient documentation

## 2022-01-16 DIAGNOSIS — J069 Acute upper respiratory infection, unspecified: Secondary | ICD-10-CM | POA: Insufficient documentation

## 2022-01-16 DIAGNOSIS — R1030 Lower abdominal pain, unspecified: Secondary | ICD-10-CM | POA: Diagnosis not present

## 2022-01-16 LAB — CBC WITH DIFFERENTIAL/PLATELET
Abs Immature Granulocytes: 0.03 10*3/uL (ref 0.00–0.07)
Basophils Absolute: 0.1 10*3/uL (ref 0.0–0.1)
Basophils Relative: 1 %
Eosinophils Absolute: 0.1 10*3/uL (ref 0.0–0.5)
Eosinophils Relative: 2 %
HCT: 40.7 % (ref 36.0–46.0)
Hemoglobin: 12.7 g/dL (ref 12.0–15.0)
Immature Granulocytes: 0 %
Lymphocytes Relative: 38 %
Lymphs Abs: 2.6 10*3/uL (ref 0.7–4.0)
MCH: 28.3 pg (ref 26.0–34.0)
MCHC: 31.2 g/dL (ref 30.0–36.0)
MCV: 90.6 fL (ref 80.0–100.0)
Monocytes Absolute: 0.5 10*3/uL (ref 0.1–1.0)
Monocytes Relative: 7 %
Neutro Abs: 3.5 10*3/uL (ref 1.7–7.7)
Neutrophils Relative %: 52 %
Platelets: 316 10*3/uL (ref 150–400)
RBC: 4.49 MIL/uL (ref 3.87–5.11)
RDW: 12.6 % (ref 11.5–15.5)
WBC: 6.8 10*3/uL (ref 4.0–10.5)
nRBC: 0 % (ref 0.0–0.2)

## 2022-01-16 LAB — COMPREHENSIVE METABOLIC PANEL
ALT: 13 U/L (ref 0–44)
AST: 16 U/L (ref 15–41)
Albumin: 4.2 g/dL (ref 3.5–5.0)
Alkaline Phosphatase: 140 U/L — ABNORMAL HIGH (ref 38–126)
Anion gap: 8 (ref 5–15)
BUN: 16 mg/dL (ref 6–20)
CO2: 26 mmol/L (ref 22–32)
Calcium: 9.7 mg/dL (ref 8.9–10.3)
Chloride: 103 mmol/L (ref 98–111)
Creatinine, Ser: 0.78 mg/dL (ref 0.44–1.00)
GFR, Estimated: >60 mL/min (ref >60)
Glucose, Bld: 106 mg/dL — ABNORMAL HIGH (ref 70–99)
Potassium: 3.9 mmol/L (ref 3.5–5.1)
Sodium: 137 mmol/L (ref 135–145)
Total Bilirubin: 0.8 mg/dL (ref 0.3–1.2)
Total Protein: 8.2 g/dL — ABNORMAL HIGH (ref 6.5–8.1)

## 2022-01-16 LAB — URINALYSIS, ROUTINE W REFLEX MICROSCOPIC
Bilirubin Urine: NEGATIVE
Glucose, UA: NEGATIVE mg/dL
Hgb urine dipstick: NEGATIVE
Ketones, ur: NEGATIVE mg/dL
Leukocytes,Ua: NEGATIVE
Nitrite: NEGATIVE
Protein, ur: NEGATIVE mg/dL
Specific Gravity, Urine: 1.018 (ref 1.005–1.030)
pH: 5 (ref 5.0–8.0)

## 2022-01-16 LAB — LIPASE, BLOOD: Lipase: 28 U/L (ref 11–51)

## 2022-01-16 NOTE — ED Triage Notes (Signed)
Pt arrives with c/o right lower flank pain and that radiates around into her ABD. Pt endorses nausea. Per pt, she has recurrent issues with UTIs.

## 2022-01-16 NOTE — ED Provider Triage Note (Signed)
Emergency Medicine Provider Triage Evaluation Note  [name] [name] , a 51 y.o. female  was evaluated in triage.  Pt complains of abdominal pain, body aches, and right flank pain. History of kidney stones as well. Has frequent UTIs. No dysuria.  Review of Systems  Positive: Dysuria, right flank pain, abdominal pain Negative: Fever/chills  Physical Exam  There were no vitals taken for this visit. Gen:   Awake, no distress   Resp:  Normal effort  MSK:   Moves extremities without difficulty  Other:    Medical Decision Making  Medically screening exam initiated at 4:39 PM.  Appropriate orders placed.  [name] [name] was informed that the remainder of the evaluation will be completed by another provider, this initial triage assessment does not replace that evaluation, and the importance of remaining in the ED until their evaluation is complete.     Marquette Old, PA-C 01/16/22 1642

## 2022-01-18 DIAGNOSIS — R300 Dysuria: Secondary | ICD-10-CM | POA: Diagnosis not present

## 2022-01-18 DIAGNOSIS — I10 Essential (primary) hypertension: Secondary | ICD-10-CM | POA: Diagnosis not present

## 2022-01-18 DIAGNOSIS — E559 Vitamin D deficiency, unspecified: Secondary | ICD-10-CM | POA: Diagnosis not present

## 2022-01-18 DIAGNOSIS — Z1211 Encounter for screening for malignant neoplasm of colon: Secondary | ICD-10-CM | POA: Diagnosis not present

## 2022-01-18 DIAGNOSIS — E785 Hyperlipidemia, unspecified: Secondary | ICD-10-CM | POA: Diagnosis not present

## 2022-01-18 DIAGNOSIS — E7849 Other hyperlipidemia: Secondary | ICD-10-CM | POA: Diagnosis not present

## 2022-01-18 DIAGNOSIS — N390 Urinary tract infection, site not specified: Secondary | ICD-10-CM | POA: Diagnosis not present

## 2022-01-18 DIAGNOSIS — Z23 Encounter for immunization: Secondary | ICD-10-CM | POA: Diagnosis not present

## 2022-01-18 DIAGNOSIS — R7303 Prediabetes: Secondary | ICD-10-CM | POA: Diagnosis not present

## 2022-01-21 ENCOUNTER — Other Ambulatory Visit: Payer: Self-pay | Admitting: Family

## 2022-01-21 DIAGNOSIS — Z1231 Encounter for screening mammogram for malignant neoplasm of breast: Secondary | ICD-10-CM

## 2022-02-18 ENCOUNTER — Telehealth: Payer: Self-pay

## 2022-02-18 NOTE — Telephone Encounter (Signed)
      Reason for call: ED-Follow up call   Patient  visited [facility] on 01/16/2022  for Abdominal Pain    Telephone encounter attempt :  1st Attempt  A HIPAA compliant voice message was left requesting a return call.  Instructed patient to call back at [phone] at their earliest convenience.  [place] management  Elizabeth, [place]  Main Phone: [phone]  E-mail: [email or web address]  Website: [email or web address]

## 2022-03-08 ENCOUNTER — Encounter: Payer: Medicare HMO | Admitting: Student in an Organized Health Care Education/Training Program

## 2022-04-07 ENCOUNTER — Encounter: Payer: Self-pay | Admitting: Student in an Organized Health Care Education/Training Program

## 2022-04-07 ENCOUNTER — Ambulatory Visit
Payer: Medicare HMO | Attending: Student in an Organized Health Care Education/Training Program | Admitting: Student in an Organized Health Care Education/Training Program

## 2022-04-07 DIAGNOSIS — G894 Chronic pain syndrome: Secondary | ICD-10-CM | POA: Diagnosis not present

## 2022-04-07 DIAGNOSIS — Q8501 Neurofibromatosis, type 1: Secondary | ICD-10-CM | POA: Diagnosis not present

## 2022-04-07 DIAGNOSIS — M792 Neuralgia and neuritis, unspecified: Secondary | ICD-10-CM | POA: Insufficient documentation

## 2022-04-07 MED ORDER — HYDROCODONE-ACETAMINOPHEN 7.5-325 MG PO TABS: 1.0000 | ORAL_TABLET | Freq: Every day | ORAL | 0 refills | Status: DC | PRN

## 2022-04-07 NOTE — Progress Notes (Signed)
Nursing Pain Medication Assessment:  Safety precautions to be maintained throughout the outpatient stay will include: orient to surroundings, keep bed in low position, maintain call bell within reach at all times, provide assistance with transfer out of bed and ambulation.  Medication Inspection Compliance: Pill count conducted under aseptic conditions, in front of the patient. Neither the pills nor the bottle was removed from the patient's sight at any time. Once count was completed pills were immediately returned to the patient in their original bottle.  Medication: Hydrocodone/APAP Pill/Patch Count:  0 of 45 pills remain Pill/Patch Appearance: Markings consistent with prescribed medication Bottle Appearance: Standard pharmacy container. Clearly labeled. Filled Date: 51 / 10 / 2023 Last Medication intake:  Ran out of medicine more than 48 hours agoSafety precautions to be maintained throughout the outpatient stay will include: orient to surroundings, keep bed in low position, maintain call bell within reach at all times, provide assistance with transfer out of bed and ambulation.

## 2022-04-07 NOTE — Progress Notes (Signed)
PROVIDER NOTE: Information contained herein reflects review and annotations entered in association with encounter. Interpretation of such information and data should be left to medically-trained personnel. Information provided to patient can be located elsewhere in the medical record under "Patient Instructions". Document created using STT-dictation technology, any transcriptional errors that may result from process are unintentional.    Patient: [name] [name]  Service Category: E/M  Provider: Gillis Santa, MD  DOB: June 02, 1970  DOS: 04/07/2022  Specialty: Interventional Pain Management  MRN: [id number]  Setting: Ambulatory outpatient  PCP: Perrin Maltese, MD  Type: Established Patient    Referring Provider: Perrin Maltese, MD  Location: Office  Delivery: Face-to-face     HPI  [name], a 51 y.o. year old female, is here today because of her No primary diagnosis found.. Ms. Joe's primary complain today is Back Pain (upper) and Hip Pain (right)  Last encounter: My last encounter with her was on 12/16/21  Pertinent problems: Ms. Deland has Back pain of thoracolumbar region; Neurofibromatosis, type 1 (von Recklinghausen's disease) ([place]); Idiopathic scoliosis; Chest wall pain, chronic; Thoracic spine pain; Neurofibroma; Neuropathic pain; Neurofibromatosis, type 1 ([place]); and Chronic pain syndrome on their pertinent problem list. Pain Assessment: Severity of Chronic pain is reported as a 8 /10. Location: Back Upper/denies. Onset: More than a month ago. Quality: Sharp, Stabbing. Timing: Constant. Modifying factor(s): Hydrocodone, lying down. Vitals:  height is _0  (1.549 m) and weight is 146 lb (66.2 kg). Her temporal temperature is 97 F (36.1 C) (abnormal). Her blood pressure is 126/94 (abnormal) and her pulse is 96. Her respiration is 16 and oxygen saturation is 100%.   Reason for encounter: medication management.   Patient continues multimodal pain regimen as prescribed.  States that it  provides pain relief and improvement in functional status. No falls or visits to urgent care or emergency department Continues Gabapentin and Flexeril as Rx'd, no refills needed Takes Imitrex for abortive migraines 1-2x/month    Pharmacotherapy Assessment  Analgesic: Hydrocodone 7.5 mg twice daily as needed, quantity 45/month; MME equals 15    Monitoring: [place] PMP: PDMP reviewed during this encounter.       Pharmacotherapy: No side-effects or adverse reactions reported. Compliance: No problems identified. Effectiveness: Clinically acceptable.  UDS:  Summary  Date Value Ref Range Status  12/16/2021 Note  Final    Comment:    ==================================================================== [employer] Select 13 (MW) ==================================================================== Test                             Result       Flag       Units  Drug Present and Declared for Prescription Verification   Hydrocodone                    984          EXPECTED   ng/mg creat   Hydromorphone                  205          EXPECTED   ng/mg creat   Dihydrocodeine                 80           EXPECTED   ng/mg creat   Norhydrocodone                 1772  EXPECTED   ng/mg creat    Sources of hydrocodone include scheduled prescription medications.    Hydromorphone, dihydrocodeine and norhydrocodone are expected    metabolites of hydrocodone. Hydromorphone and dihydrocodeine are    also available as scheduled prescription medications.  ==================================================================== Test                      Result    Flag   Units      Ref Range   Creatinine              169              mg/dL      >=20 ==================================================================== Declared Medications:  The flagging and interpretation on this report are based on the  following declared medications.  Unexpected results may arise from  inaccuracies in the declared  medications.   **Note: The testing scope of this panel includes these medications:   Hydrocodone (Norco)   **Note: The testing scope of this panel does not include the  following reported medications:   Acetaminophen (Tylenol)  Acetaminophen (Norco)  Albuterol (Ventolin HFA)  Cyclobenzaprine (Flexeril)  Desvenlafaxine (Pristiq)  Gabapentin (Neurontin)  Ketorolac (Toradol)  Metoprolol (Toprol)  Nortriptyline (Pamelor)  Ondansetron (Zofran)  Oxybutynin (Ditropan)  Pantoprazole (Protonix)  Phenazopyridine (Pyridium)  Sumatriptan (Imitrex)  Tamsulosin (Flomax)  Vitamin D2 ==================================================================== For clinical consultation, please call [phone]. ====================================================================       ROS  Constitutional: Denies any fever or chills Gastrointestinal: No reported hemesis, hematochezia, vomiting, or acute GI distress Musculoskeletal:  Low back, bilateral hip pain Neurological: No reported episodes of acute onset apraxia, aphasia, dysarthria, agnosia, amnesia, paralysis, loss of coordination, or loss of consciousness  Medication Review  HYDROcodone-acetaminophen, SUMAtriptan, acetaminophen, albuterol, cyclobenzaprine, desvenlafaxine, ergocalciferol, gabapentin, ketorolac, metoprolol succinate, ondansetron, oxybutynin, pantoprazole, phenazopyridine, and tamsulosin  History Review  Allergy: Ms. Corbit is allergic to peanuts [peanut oil] and penicillins. Drug: Ms. Digilio  reports no history of drug use. Alcohol:  reports no history of alcohol use. Tobacco:  reports that she has never smoked. She has never used smokeless tobacco. Social: Ms. Biffle  reports that she has never smoked. She has never used smokeless tobacco. She reports that she does not drink alcohol and does not use drugs. Medical:  has a past medical history of Hypertension, Neurofibromatosis ([place]), Renal disorder, Scoliosis of  thoracic spine, and Thyroid disease. Surgical: Ms. Shiel  has a past surgical history that includes Cesarean section; chamberlain procedure; and Thyroidectomy. Family: family history includes Cancer in her mother.  Laboratory Chemistry Profile   Renal Lab Results  Component Value Date   BUN 16 01/16/2022   CREATININE 0.78 01/16/2022   GFRAA >60 05/24/2018   GFRNONAA >60 01/16/2022     Hepatic Lab Results  Component Value Date   AST 16 01/16/2022   ALT 13 01/16/2022   ALBUMIN 4.2 01/16/2022   ALKPHOS 140 (H) 01/16/2022   LIPASE 28 01/16/2022     Electrolytes Lab Results  Component Value Date   NA 137 01/16/2022   K 3.9 01/16/2022   CL 103 01/16/2022   CALCIUM 9.7 01/16/2022     Bone No results found for: "VD25OH", "VD125OH2TOT", "VZ8588FO2", "DX4128NO6", "25OHVITD1", "25OHVITD2", "25OHVITD3", "TESTOFREE", "TESTOSTERONE"   Inflammation (CRP: Acute Phase) (ESR: Chronic Phase) No results found for: "CRP", "ESRSEDRATE", "LATICACIDVEN"     Note: Above Lab results reviewed.  Recent Imaging Review  MM 3D SCREEN BREAST BILATERAL CLINICAL DATA:  Screening.  EXAM: DIGITAL SCREENING BILATERAL  MAMMOGRAM WITH TOMOSYNTHESIS AND CAD  TECHNIQUE: Bilateral screening digital craniocaudal and mediolateral oblique mammograms were obtained. Bilateral screening digital breast tomosynthesis was performed. The images were evaluated with computer-aided detection.  COMPARISON:  Previous exam(s).  ACR Breast Density Category b: There are scattered areas of fibroglandular density.  FINDINGS: There are no findings suspicious for malignancy. The images were evaluated with computer-aided detection.  IMPRESSION: No mammographic evidence of malignancy. A result letter of this screening mammogram will be mailed directly to the patient.  RECOMMENDATION: Screening mammogram in one year. (Code:SM-B-01Y)  BI-RADS CATEGORY  1: Negative.  Electronically Signed   By: Lillia Mountain M.D.    On: 06/29/2020 13:06  Note: Reviewed        Physical Exam  General appearance: Well nourished, well developed, and well hydrated. In no apparent acute distress Mental status: Alert, oriented x 3 (person, place, & time)       Respiratory: No evidence of acute respiratory distress Eyes: PERLA Vitals: BP (!) 126/94   Pulse 96   Temp (!) 97 F (36.1 C) (Temporal)   Resp 16   Ht _0  (1.549 m)   Wt 146 lb (66.2 kg)   SpO2 100%   BMI 27.59 kg/m  BMI: Estimated body mass index is 27.59 kg/m as calculated from the following:   Height as of this encounter: _1  (1.549 m).   Weight as of this encounter: 146 lb (66.2 kg). Ideal: Ideal body weight: 47.8 kg (105 lb 6.1 oz) Adjusted ideal body weight: 55.2 kg (121 lb 10 oz)  +low back pain, bilateral hip pain  5 out of 5 strength bilateral lower extremity: Plantar flexion, dorsiflexion, knee flexion, knee extension.   Assessment   Status Diagnosis  Controlled Controlled Controlled 1. 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 Chronic pain syndrome   3. Neuropathic pain          Plan of Care   Ms. Thekla L Kruser has a current medication list which includes the following long-term medication(s): albuterol, desvenlafaxine, gabapentin, metoprolol succinate, pantoprazole, sumatriptan, hydrocodone-acetaminophen, [START ON 05/07/2022] hydrocodone-acetaminophen, and [START ON 06/06/2022] hydrocodone-acetaminophen.  Pharmacotherapy (Medications Ordered): Meds ordered this encounter  Medications   HYDROcodone-acetaminophen (NORCO) 7.5-325 MG tablet    Sig: Take 1-2 tablets by mouth daily as needed for severe pain. Must last 30 days.    Dispense:  45 tablet    Refill:  0    Chronic Pain. (STOP Act - Not applicable). Fill one day early if closed on scheduled refill date.   HYDROcodone-acetaminophen (NORCO) 7.5-325 MG tablet    Sig: Take 1-2 tablets by mouth daily as needed for severe pain. Must last 30 days.     Dispense:  45 tablet    Refill:  0    Chronic Pain. (STOP Act - Not applicable). Fill one day early if closed on scheduled refill date.   HYDROcodone-acetaminophen (NORCO) 7.5-325 MG tablet    Sig: Take 1-2 tablets by mouth daily as needed for severe pain. Must last 30 days.    Dispense:  45 tablet    Refill:  0    Chronic Pain. (STOP Act - Not applicable). Fill one day early if closed on scheduled refill date.   Continue Gabapentin and Flexeril as Rx'd no refills needed  No orders of the defined types were placed in this encounter.    Follow-up plan:   Return in about 3 months (around 07/07/2022) for Medication Management, in person.   Recent  Visits No visits were found meeting these conditions. Showing recent visits within past 90 days and meeting all other requirements Today's Visits Date Type Provider Dept  04/07/22 Office Visit Gillis Santa, MD [facility]  Showing today's visits and meeting all other requirements Future Appointments No visits were found meeting these conditions. Showing future appointments within next 90 days and meeting all other requirements  I discussed the assessment and treatment plan with the patient. The patient was provided an opportunity to ask questions and all were answered. The patient agreed with the plan and demonstrated an understanding of the instructions.  Patient advised to call back or seek an in-person evaluation if the symptoms or condition worsens.  Duration of encounter:30 minutes.  Note by: Gillis Santa, MD Date: 04/07/2022; Time: 10:20 AM

## 2022-04-09 DIAGNOSIS — E785 Hyperlipidemia, unspecified: Secondary | ICD-10-CM | POA: Diagnosis not present

## 2022-04-09 DIAGNOSIS — I10 Essential (primary) hypertension: Secondary | ICD-10-CM | POA: Diagnosis not present

## 2022-04-30 ENCOUNTER — Other Ambulatory Visit: Payer: Self-pay | Admitting: Student in an Organized Health Care Education/Training Program

## 2022-05-02 ENCOUNTER — Telehealth: Payer: Self-pay

## 2022-05-02 ENCOUNTER — Other Ambulatory Visit: Payer: Self-pay

## 2022-05-02 NOTE — Telephone Encounter (Signed)
She said she got a text from [facility] stating her gabapenin needs approval.

## 2022-05-02 NOTE — Telephone Encounter (Signed)
Refill request sent to Dr Lateef.  

## 2022-05-10 ENCOUNTER — Telehealth: Payer: Self-pay | Admitting: Student in an Organized Health Care Education/Training Program

## 2022-05-10 ENCOUNTER — Other Ambulatory Visit: Payer: Self-pay | Admitting: Student in an Organized Health Care Education/Training Program

## 2022-05-10 NOTE — Telephone Encounter (Signed)
PT stated that she has been trying to get Gabapentin since 05-02-22. PT stated that she has been calling along with the pharmacy as well.PT stated that she is out and pharmacy stated that they are still waiting on approval from doctor. Please give patient a call.Thanks

## 2022-05-11 ENCOUNTER — Other Ambulatory Visit: Payer: Self-pay

## 2022-05-11 MED ORDER — GABAPENTIN 600 MG PO TABS: 600.0000 mg | ORAL_TABLET | Freq: Four times a day (QID) | ORAL | 5 refills | Status: DC

## 2022-05-11 NOTE — Telephone Encounter (Signed)
Refill request sent to Dr Lateef.  

## 2022-05-19 ENCOUNTER — Other Ambulatory Visit: Payer: Self-pay | Admitting: Student in an Organized Health Care Education/Training Program

## 2022-05-19 DIAGNOSIS — M546 Pain in thoracic spine: Secondary | ICD-10-CM

## 2022-05-19 DIAGNOSIS — Q8501 Neurofibromatosis, type 1: Secondary | ICD-10-CM

## 2022-05-19 DIAGNOSIS — G894 Chronic pain syndrome: Secondary | ICD-10-CM

## 2022-05-20 ENCOUNTER — Other Ambulatory Visit: Payer: Self-pay | Admitting: Internal Medicine

## 2022-05-20 ENCOUNTER — Encounter: Payer: Self-pay | Admitting: Family

## 2022-05-20 ENCOUNTER — Ambulatory Visit: Payer: Medicare HMO | Admitting: Family

## 2022-05-20 VITALS — BP 124/74 | HR 120 | Ht 61.0 in | Wt 152.6 lb

## 2022-05-20 DIAGNOSIS — E785 Hyperlipidemia, unspecified: Secondary | ICD-10-CM | POA: Diagnosis not present

## 2022-05-20 DIAGNOSIS — Z124 Encounter for screening for malignant neoplasm of cervix: Secondary | ICD-10-CM

## 2022-05-20 DIAGNOSIS — E538 Deficiency of other specified B group vitamins: Secondary | ICD-10-CM | POA: Diagnosis not present

## 2022-05-20 DIAGNOSIS — E039 Hypothyroidism, unspecified: Secondary | ICD-10-CM

## 2022-05-20 DIAGNOSIS — Z1159 Encounter for screening for other viral diseases: Secondary | ICD-10-CM

## 2022-05-20 DIAGNOSIS — Z6828 Body mass index (BMI) 28.0-28.9, adult: Secondary | ICD-10-CM

## 2022-05-20 DIAGNOSIS — Z202 Contact with and (suspected) exposure to infections with a predominantly sexual mode of transmission: Secondary | ICD-10-CM | POA: Diagnosis not present

## 2022-05-20 DIAGNOSIS — I10 Essential (primary) hypertension: Secondary | ICD-10-CM | POA: Diagnosis not present

## 2022-05-20 DIAGNOSIS — Q8501 Neurofibromatosis, type 1: Secondary | ICD-10-CM

## 2022-05-20 DIAGNOSIS — Z0000 Encounter for general adult medical examination without abnormal findings: Secondary | ICD-10-CM

## 2022-05-20 DIAGNOSIS — Z0001 Encounter for general adult medical examination with abnormal findings: Secondary | ICD-10-CM | POA: Diagnosis not present

## 2022-05-20 DIAGNOSIS — N1831 Chronic kidney disease, stage 3a: Secondary | ICD-10-CM

## 2022-05-20 DIAGNOSIS — Z01419 Encounter for gynecological examination (general) (routine) without abnormal findings: Secondary | ICD-10-CM

## 2022-05-20 DIAGNOSIS — N189 Chronic kidney disease, unspecified: Secondary | ICD-10-CM | POA: Diagnosis not present

## 2022-05-20 DIAGNOSIS — R7303 Prediabetes: Secondary | ICD-10-CM

## 2022-05-20 DIAGNOSIS — E559 Vitamin D deficiency, unspecified: Secondary | ICD-10-CM | POA: Diagnosis not present

## 2022-05-20 LAB — POCT URINALYSIS DIPSTICK
Bilirubin, UA: 1
Blood, UA: NEGATIVE
Glucose, UA: NEGATIVE
Ketones, UA: 5
Nitrite, UA: NEGATIVE
Protein, UA: POSITIVE — AB
Spec Grav, UA: >=1.03 — AB (ref 1.010–1.025)
Urobilinogen, UA: 0.2 E.U./dL
pH, UA: 6 (ref 5.0–8.0)

## 2022-05-20 MED ORDER — NEXLIZET 180-10 MG PO TABS: 1.0000 | ORAL_TABLET | Freq: Every day | ORAL | 1 refills | Status: DC

## 2022-05-20 NOTE — Patient Instructions (Signed)
Health Maintenance, Female Adopting a healthy lifestyle and getting preventive care are important in promoting health and wellness. Ask your health care provider about: The right schedule for you to have regular tests and exams. Things you can do on your own to prevent diseases and keep yourself healthy. What should I know about diet, weight, and exercise? Eat a healthy diet  Eat a diet that includes plenty of vegetables, fruits, low-fat dairy products, and lean protein. Do not eat a lot of foods that are high in solid fats, added sugars, or sodium. Maintain a healthy weight Body mass index (BMI) is used to identify weight problems. It estimates body fat based on height and weight. Your health care provider can help determine your BMI and help you achieve or maintain a healthy weight. Get regular exercise Get regular exercise. This is one of the most important things you can do for your health. Most adults should: Exercise for at least 150 minutes each week. The exercise should increase your heart rate and make you sweat (moderate-intensity exercise). Do strengthening exercises at least twice a week. This is in addition to the moderate-intensity exercise. Spend less time sitting. Even light physical activity can be beneficial. Watch cholesterol and blood lipids Have your blood tested for lipids and cholesterol at 52 years of age, then have this test every 5 years. Have your cholesterol levels checked more often if: Your lipid or cholesterol levels are high. You are older than 52 years of age. You are at high risk for heart disease. What should I know about cancer screening? Depending on your health history and family history, you may need to have cancer screening at various ages. This may include screening for: Breast cancer. Cervical cancer. Colorectal cancer. Skin cancer. Lung cancer. What should I know about heart disease, diabetes, and high blood pressure? Blood pressure and heart  disease High blood pressure causes heart disease and increases the risk of stroke. This is more likely to develop in people who have high blood pressure readings or are overweight. Have your blood pressure checked: Every 3-5 years if you are 18-39 years of age. Every year if you are 40 years old or older. Diabetes Have regular diabetes screenings. This checks your fasting blood sugar level. Have the screening done: Once every three years after age 40 if you are at a normal weight and have a low risk for diabetes. More often and at a younger age if you are overweight or have a high risk for diabetes. What should I know about preventing infection? Hepatitis B If you have a higher risk for hepatitis B, you should be screened for this virus. Talk with your health care provider to find out if you are at risk for hepatitis B infection. Hepatitis C Testing is recommended for: Everyone born from 1945 through 1965. Anyone with known risk factors for hepatitis C. Sexually transmitted infections (STIs) Get screened for STIs, including gonorrhea and chlamydia, if: You are sexually active and are younger than 52 years of age. You are older than 52 years of age and your health care provider tells you that you are at risk for this type of infection. Your sexual activity has changed since you were last screened, and you are at increased risk for chlamydia or gonorrhea. Ask your health care provider if you are at risk. Ask your health care provider about whether you are at high risk for HIV. Your health care provider may recommend a prescription medicine to help prevent HIV   infection. If you choose to take medicine to prevent HIV, you should first get tested for HIV. You should then be tested every 3 months for as long as you are taking the medicine. Pregnancy If you are about to stop having your period (premenopausal) and you may become pregnant, seek counseling before you get pregnant. Take 400 to 800  micrograms (mcg) of folic acid every day if you become pregnant. Ask for birth control (contraception) if you want to prevent pregnancy. Osteoporosis and menopause Osteoporosis is a disease in which the bones lose minerals and strength with aging. This can result in bone fractures. If you are 10 years old or older, or if you are at risk for osteoporosis and fractures, ask your health care provider if you should: Be screened for bone loss. Take a calcium or vitamin D supplement to lower your risk of fractures. Be given hormone replacement therapy (HRT) to treat symptoms of menopause. Follow these instructions at home: Alcohol use Do not drink alcohol if: Your health care provider tells you not to drink. You are pregnant, may be pregnant, or are planning to become pregnant. If you drink alcohol: Limit how much you have to: 0-1 drink a day. Know how much alcohol is in your drink. In the [place] one drink equals one 12 oz bottle of beer (355 mL), one 5 oz glass of wine (148 mL), or one 1 oz glass of hard liquor (44 mL). Lifestyle Do not use any products that contain nicotine or tobacco. These products include cigarettes, chewing tobacco, and vaping devices, such as e-cigarettes. If you need help quitting, ask your health care provider. Do not use street drugs. Do not share needles. Ask your health care provider for help if you need support or information about quitting drugs. General instructions Schedule regular health, dental, and eye exams. Stay current with your vaccines. Tell your health care provider if: You often feel depressed. You have ever been abused or do not feel safe at home. Summary Adopting a healthy lifestyle and getting preventive care are important in promoting health and wellness. Follow your health care provider's instructions about healthy diet, exercising, and getting tested or screened for diseases. Follow your health care provider's instructions on monitoring your  cholesterol and blood pressure. This information is not intended to replace advice given to you by your health care provider. Make sure you discuss any questions you have with your health care provider. Document Revised: 08/17/2020 Document Reviewed: 08/17/2020 Elsevier Patient Education  [place] en los adultos Colonoscopy, Adult [place] colonoscopa es un examen que se realiza para examinar el intestino grueso. Se realiza [employer] de un tubo [place], fino y flexible que tiene una cmara en el extremo. Este examen se realiza para detectar si hay problemas, por ejemplo: Un crecimiento anormal de clulas o tejido (tumor). Crecimientos anormales dentro del revestimiento del intestino (plipos). Irritacin e hinchazn(inflamacin). Sangrado. Consulte al mdico acerca de lo siguiente: Cualquier alergia que tenga. Todos los [employer] toma. Infrmele sobre vitaminas, hierbas, gotas oftlmicas, cremas y medicamentos de venta libre. Problemas previos que usted o algn miembro de su familia hayan tenido con los anestsicos. Cualquier problema de la sangre que tenga. Cirugas a las que se haya sometido. Cualquier afeccin mdica que tenga. Cualquier problema que haya tenido al defecar ( deposiciones). Si est embarazada o podra estarlo. Cules son los riesgos? En general, se trata de un procedimiento seguro. Sin embargo, pueden presentarse problemas, por ejemplo: Sangrado. Dao intestinal.  Reacciones alrgicas a los medicamentos administrados durante el procedimiento. Infeccin. Esto es poco frecuente. Qu ocurre antes del procedimiento? Comida y bebida Siga las instrucciones del mdico respecto de las comidas y las bebidas. Pueden incluir: [employer] del procedimiento: Siga una dieta con bajo contenido de [place]. Evite estos alimentos: Frutos secos. Semillas. Frutas pasas. Frutas crudas. Vegetales. [place] 1 y 3 das antes del procedimiento: Coma  solo postre de gelatina o helados de [place]. Beba solamente lquidos transparentes, por ejemplo: [place]. Caldos o sopas transparentes. Caf negro o t. Jugos transparentes. Refrescos o bebidas deportivas transparentes. No beba lquidos con colorante rojo o morado. El da del procedimiento: No coma alimentos slidos. Puede continuar bebiendo lquidos claros hasta 2 horas antes del procedimiento. No coma ni beba nada a partir de 2 horas antes al procedimiento o segn le haya indicado el mdico. Preparado intestinal Si le recetaron un preparado intestinal para tomar por la boca (por va oral) para limpiar el colon: Tmelo como se lo haya indicado el mdico. A partir del da anterior al procedimiento, tendr que beber una gran cantidad de un medicamento lquido. El lquido lo har defecar hasta que la materia fecal sea casi transparente o de color verde claro. Si la piel o la zona anal se le irritan debido a Building services engineer, Fish farm manager lo siguiente: Limpie la zona con toallitas que contengan productos medicinales, por ejemplo, toallitas hmedas para adultos con aloe y vitamina E. Aplquese un producto en la piel que suavice la zona, como vaselina. Si vomita mientras toma el preparado intestinal: Haga una pausa durante 60 minutos como mximo. Comience a tomar el preparado nuevamente. Llame al mdico si sigue vomitando y no puede tomar el preparado intestinal sin vomitar. Para limpiarle el colon, tambin pueden darle: Medicamentos laxantes. Estos ayudan a defecar. Instrucciones para el uso de un medicamento lquido (enema) inyectado en el ano. Medicamentos Consulte al mdico si debe cambiar o suspender: Sus medicamentos habituales. Vitaminas, hierbas y suplementos. Medicamentos de [employer]. No tome aspirina ni ibuprofeno a menos que se lo indiquen. Instrucciones generales Pregntele al mdico qu medidas se tomarn para evitar la propagacin de grmenes. Estas pueden incluir lavar la piel con un  jabn para eliminar los grmenes. Si va a marcharse a su casa inmediatamente despus del procedimiento, pdale a un adulto responsable que: Lo lleve a su casa desde el hospital o la clnica. No se le permitir conducir. Lo cuide durante el [employer] indiquen. Qu ocurre durante el procedimiento?  Se le colocar un tubo (catter) intravenoso en una vena. Le administrarn un medicamento para hacerlo dormir (anestesia general). Se recostar de costado con las rodillas flexionadas. Se colocar aceite o gel sobre el tubo. Luego, se har lo siguiente con el tubo: Se colocar en la abertura del ano. Se introducir suavemente en el intestino grueso. Le aplicarn aire en el colon para mantenerlo abierto. Es posible que sienta presin o clicos. La cmara se utilizar para tomar fotos que Education officer, community. Podrn tomarle una pequea muestra de tejido (biopsia) para examinarla. Si se encuentran pequeos crecimientos, el mdico puede extirparlos y analizarlos para Product manager presencia de cncer. El tubo se extraer lentamente. Este procedimiento puede variar segn el mdico y el hospital. Sander Nephew ocurre despus del procedimiento? Lo controlarn hasta que deje el hospital o la [place]. Esto incluye controlar la presin arterial, la frecuencia cardaca y Personal assistant, y el nivel de oxgeno en la sangre. Es posible que encuentre una pequea cantidad de [place] en  la materia fecal. Puede eliminar gases. Puede sentir clicos leves o distensin en el abdomen. Si le administraron un sedante durante el procedimiento, no conduzca ni use mquinas hasta que el [employer] indique que es seguro [place]. Es su responsabilidad retirar [employer] del procedimiento. Pregunte cmo obtener sus resultados cuando estn listos. Resumen [place] colonoscopa es un examen que se realiza para examinar el intestino grueso. Siga las instrucciones de su mdico acerca de comer y beber antes del procedimiento. Es  posible que le receten un preparado intestinal por va oral para limpiar el colon. Tmelo como se lo haya indicado el mdico. Se introducir un tubo flexible con una cmara en su extremo en el orificio del ano. Se introducir en el intestino grueso. Esta informacin no tiene Marine scientist el consejo del mdico. Asegrese de hacerle al mdico cualquier pregunta que tenga. Document Revised: 03/23/2021 Document Reviewed: 03/23/2021 Elsevier Patient Education  [place] and Cholesterol Restricted Eating Plan Eating a diet that limits fat and cholesterol may help lower your risk for heart disease and other conditions. Your body needs fat and cholesterol for basic functions, but eating too much of these things can be harmful to your health. Your health care provider may order lab tests to check your blood fat (lipid) and cholesterol levels. This helps your health care provider understand your risk for certain conditions and whether you need to make diet changes. Work with your health care provider or dietitian to make an eating plan that is right for you. Your plan includes: Limit your fat intake to ______% or less of your total calories a day. This is ______g of fat per day. Limit your saturated fat intake to ______% or less of your total calories a day. This is ______g of saturated fat per day. Limit the amount of cholesterol in your diet to less than _________mg a day. Eat ___________ g of fiber a day. What are tips for following this plan? General guidelines If you are overweight, work with your health care provider to lose weight safely. Losing just 5-10% of your body weight can improve your overall health and help prevent diseases such as diabetes and heart disease. Avoid: Foods with added sugar. Fried foods. Foods that contain partially hydrogenated oils, including stick margarine, some tub margarines, cookies, crackers, and other baked goods. If you drink alcohol: Limit  how much you have to: 0-1 drink a day for women who are not pregnant. 0-2 drinks a day for men. Know how much alcohol is in a drink. In the [place] one drink equals one 12 oz bottle of beer (355 mL), one 5 oz glass of wine (148 mL), or one 1 oz glass of hard liquor (44 mL). Reading food labels Check food labels for: Trans fats or partially hydrogenated oils. Avoid foods that contain these. High amounts of saturated fat. Choose foods that are low in saturated fat (less than 2 g). The amount of cholesterol in each serving. The amount of fiber in each serving. Choose foods with healthy fats, such as: Monounsaturated and polyunsaturated fats. These include olive and canola oil, flaxseeds, walnuts, almonds, and seeds. Omega-3 fats. These are found in foods such as salmon, mackerel, sardines, tuna, flaxseed oil, and ground flaxseeds. Choose grain products that have whole grains. Look for the word "whole" as the first word in the ingredient list. Cooking Cook foods using methods other than frying. Baking, boiling, grilling, and broiling are some healthy options. Eat more home-cooked food and  less restaurant, buffet, and fast food. Avoid cooking using saturated fats. Animal sources of saturated fats include meats, butter, and cream. Plant sources of saturated fats include palm oil, palm kernel oil, and coconut oil. Meal planning  At meals, imagine dividing your plate into fourths: Fill one-half of your plate with vegetables, green salads, and fruit. Fill one-fourth of your plate with whole grains. Fill one-fourth of your plate with lean protein foods. Eat fish that is high in omega-3 fats at least two times a week. Eat more foods that contain fiber, such as whole grains, beans, apples, pears, berries, broccoli, carrots, peas, and barley. These foods help promote healthy cholesterol levels in the blood. What foods should I eat? Fruits All fresh, canned (in natural juice), or frozen  fruits. Vegetables Fresh or frozen vegetables (raw, steamed, roasted, or grilled). Green salads. Grains Whole grains, such as whole wheat or whole grain breads, crackers, cereals, and pasta. Unsweetened oatmeal, bulgur, barley, quinoa, or brown rice. Corn or whole wheat flour tortillas. Meats and other proteins Ground beef (85% or leaner), grass-fed beef, or beef trimmed of fat. Skinless chicken or [place]. Ground chicken or [place]. Pork trimmed of fat. All fish and seafood. Egg whites. Dried beans, peas, or lentils. Unsalted nuts or seeds. Unsalted canned beans. Natural nut butters without added sugar and oil. Dairy Low-fat or nonfat dairy products, such as skim or 1% milk, 2% or reduced-fat cheeses, low-fat and fat-free ricotta or cottage cheese, or plain low-fat and nonfat yogurt. Fats and oils Tub margarine without trans fats. Light or reduced-fat mayonnaise and salad dressings. Avocado. Olive, canola, sesame, or safflower oils. The items listed above may not be a complete list of foods and beverages you can eat. Contact a dietitian for more information. What foods should I avoid? Fruits Canned fruit in heavy syrup. Fruit in cream or butter sauce. Fried fruit. Vegetables Vegetables cooked in cheese, cream, or butter sauce. Fried vegetables. Grains White bread. White pasta. White rice. Cornbread. Bagels, pastries, and croissants. Crackers and snack foods that contain trans fat and hydrogenated oils. Meats and other proteins Fatty cuts of meat. Ribs, chicken wings, bacon, sausage, bologna, salami, chitterlings, fatback, hot dogs, bratwurst, and packaged lunch meats. Liver and organ meats. Whole eggs and egg yolks. Chicken and [place] with skin. Fried meat. Dairy Whole or 2% milk, cream, half-and-half, and cream cheese. Whole milk cheeses. Whole-fat or sweetened yogurt. Full-fat cheeses. Nondairy creamers and whipped toppings. Processed cheese, cheese spreads, and cheese curds. Fats and  oils Butter, stick margarine, lard, shortening, ghee, or bacon fat. Coconut, palm kernel, and palm oils. Beverages Alcohol. Sugar-sweetened drinks such as sodas, lemonade, and fruit drinks. Sweets and desserts Corn syrup, sugars, honey, and molasses. Candy. Jam and jelly. Syrup. Sweetened cereals. Cookies, pies, cakes, donuts, muffins, and ice cream. The items listed above may not be a complete list of foods and beverages you should avoid. Contact a dietitian for more information. Summary Your body needs fat and cholesterol for basic functions. However, eating too much of these things can be harmful to your health. Work with your health care provider and dietitian to follow a diet that limits fat and cholesterol. Doing this may help lower your risk for heart disease and other conditions. Choose healthy fats, such as monounsaturated and polyunsaturated fats, and foods high in omega-3 fatty acids. Eat fiber-rich foods, such as whole grains, beans, peas, fruits, and vegetables. Limit or avoid alcohol, fried foods, and foods high in saturated fats, partially hydrogenated oils, and  sugar. This information is not intended to replace advice given to you by your health care provider. Make sure you discuss any questions you have with your health care provider. Document Revised: 08/07/2020 Document Reviewed: 08/07/2020 Elsevier Patient Education  [place] [facility]) Exercise Recommendation  Being physically active is important to prevent heart disease and stroke, the nation's No. 1and No. 5killers. To improve overall cardiovascular health, we suggest at least 150 minutes per week of moderate exercise or 75 minutes per week of vigorous exercise (or a combination of moderate and vigorous activity). Thirty minutes a day, five times a week is an easy goal to remember. You will also experience benefits even if you divide your time into two or three segments of 10 to 15 minutes  per day.  For people who would benefit from lowering their blood pressure or cholesterol, we recommend 40 minutes of aerobic exercise of moderate to vigorous intensity three to four times a week to lower the risk for heart attack and stroke.  Physical activity is anything that makes you move your body and burn calories.  This includes things like climbing stairs or playing sports. Aerobic exercises benefit your heart, and include walking, jogging, swimming or biking. Strength and stretching exercises are best for overall stamina and flexibility.  The simplest, positive change you can make to effectively improve your heart health is to start walking. It's enjoyable, free, easy, social and great exercise. A walking program is flexible and boasts high success rates because people can stick with it. It's easy for walking to become a regular and satisfying part of life.   For Overall Cardiovascular Health: At least 30 minutes of moderate-intensity aerobic activity at least 5 days per week for a total of 150  OR  At least 25 minutes of vigorous aerobic activity at least 3 days per week for a total of 75 minutes; or a combination of moderate- and vigorous-intensity aerobic activity  AND  Moderate- to high-intensity muscle-strengthening activity at least 2 days per week for additional health benefits.  For Lowering Blood Pressure and Cholesterol An average 40 minutes of moderate- to vigorous-intensity aerobic activity 3 or 4 times per week  What if I can't make it to the time goal? Something is always better than nothing! And everyone has to start somewhere. Even if you've been sedentary for years, today is the day you can begin to make healthy changes in your life. If you don't think you'll make it for 30 or 40 minutes, set a reachable goal for today. You can work up toward your overall goal by increasing your time as you get stronger. Don't let all-or-nothing thinking rob you of doing what you  can every day.  Source:http://www.heart.org

## 2022-05-20 NOTE — Progress Notes (Signed)
Complete physical exam  Patient: [name] [name]   DOB: 05/12/1970   52 y.o. Female  MRN: [id number]  Subjective:    Chief Complaint  Patient presents with   Annual Exam    CPE/PAP    Chambers is a 52 y.o. female who presents today for a complete physical exam. She reports consuming a low fat diet. Exercise is limited by orthopedic condition(s): Arthritis, chronic pain. She generally feels fairly well. She reports sleeping fairly well. She does not have additional problems to discuss today.    Most recent fall risk assessment:    04/07/2022   10:05 AM  [place] in the past year? 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   Renal disorder    Scoliosis of thoracic spine    "C" curve, convex to left   Thyroid disease    controlled   Past Surgical History:  Procedure Laterality Date   CESAREAN SECTION     chamberlain procedure     THYROIDECTOMY     Social History   Tobacco Use   Smoking status: Never   Smokeless tobacco: Never   Tobacco comments:    this patient is a nonsmoker   Substance Use Topics   Alcohol use: No    Alcohol/week: 0.0 standard drinks of alcohol   Drug use: No   Family History  Problem Relation Age of Onset   Cancer Mother    Breast cancer Neg Hx    Allergies  Allergen Reactions   Peanuts [Peanut Oil] Hives and Other (See Comments)    Mouth ulcers   Penicillins Hives and Rash      Patient Care Team: Mechele Claude, FNP as PCP - General (Family Medicine)   Outpatient Medications Prior to Visit  Medication Sig   albuterol (PROVENTIL HFA;VENTOLIN HFA) 108 (90 BASE) MCG/ACT inhaler Inhale into the lungs every 6 (six) hours as needed  for wheezing or shortness of breath.   amitriptyline (ELAVIL) 50 MG tablet Take 50 mg by mouth at bedtime.   Bempedoic Acid-Ezetimibe (NEXLIZET) 180-10 MG TABS Take 180 tablets by mouth once.   cyclobenzaprine (FLEXERIL) 10 MG tablet Take 1 tablet (10 mg total) by mouth 3 (three) times daily as needed for muscle spasms.   gabapentin (NEURONTIN) 600 MG tablet Take 1 tablet (600 mg total) by mouth 4 (four) times daily.   ondansetron (ZOFRAN-ODT) 8 MG disintegrating tablet DISSOLVE 1 TABLET UNDER THE TONGUE THREE TIMES DAILY AS NEEDED FOR NAUSEA   pantoprazole (PROTONIX) 40 MG tablet Take 40 mg by mouth daily.   acetaminophen (TYLENOL) 500 MG tablet Take 500 mg by mouth every 8 (eight) hours as needed. 3 tabs   desvenlafaxine (PRISTIQ) 50 MG 24 hr tablet Take 50 mg by mouth every morning.   ergocalciferol (VITAMIN D2) 1.25 MG (50000 UT) capsule Take 50,000 Units by mouth once a week.   HYDROcodone-acetaminophen (NORCO) 7.5-325 MG tablet Take 1-2 tablets by mouth daily as needed for severe pain. Must last 30 days.   HYDROcodone-acetaminophen (NORCO) 7.5-325 MG tablet Take 1-2 tablets by  mouth daily as needed for severe pain. Must last 30 days.   [START ON 06/06/2022] HYDROcodone-acetaminophen (NORCO) 7.5-325 MG tablet Take 1-2 tablets by mouth daily as needed for severe pain. Must last 30 days.   ketorolac (TORADOL) 10 MG tablet Take 10 mg by mouth 2 (two) times daily as needed.   metoprolol succinate (TOPROL-XL) 100 MG 24 hr tablet Take by mouth.   oxybutynin (DITROPAN-XL) 5 MG 24 hr tablet Take 5 mg by mouth daily.   phenazopyridine (PYRIDIUM) 100 MG tablet Take 100 mg by mouth as needed.   SUMAtriptan (IMITREX) 100 MG tablet Take 100 mg by mouth every 2 (two) hours as needed for migraine. May repeat in 2 hours if headache persists or recurs.   tamsulosin (FLOMAX) 0.4 MG CAPS capsule Take 0.4 mg by mouth daily.   No facility-administered medications prior to visit.    ROS        Objective:      BP 124/74   Pulse (!) 120   Ht 5' 1"$  (1.549 m)   Wt 152 lb 9.6 oz (69.2 kg)   SpO2 98%   BMI 28.83 kg/m  BP Readings from Last 3 Encounters:  05/20/22 124/74  04/07/22 (!) 126/94  01/16/22 (!) 132/97   Wt Readings from Last 3 Encounters:  05/20/22 152 lb 9.6 oz (69.2 kg)  04/07/22 146 lb (66.2 kg)  01/16/22 145 lb (65.8 kg)      Physical Exam   Results for orders placed or performed in visit on 05/20/22  POCT Urinalysis Dipstick (81002)  Result Value Ref Range   Color, UA yellow    Clarity, UA cloudy    Glucose, UA Negative Negative   Bilirubin, UA 1    Ketones, UA 5    Spec Grav, UA >=1.030 (A) 1.010 - 1.025   Blood, UA negative    pH, UA 6.0 5.0 - 8.0   Protein, UA Positive (A) Negative   Urobilinogen, UA 0.2 0.2 or 1.0 E.U./dL   Nitrite, UA negative    Leukocytes, UA Small (1+) (A) Negative   Appearance     Odor     Last CBC Lab Results  Component Value Date   WBC 6.8 01/16/2022   HGB 12.7 01/16/2022   HCT 40.7 01/16/2022   MCV 90.6 01/16/2022   MCH 28.3 01/16/2022   RDW 12.6 01/16/2022   PLT 316 [id number]   Last metabolic panel Lab Results  Component Value Date   GLUCOSE 106 (H) 01/16/2022   NA 137 01/16/2022   K 3.9 01/16/2022   CL 103 01/16/2022   CO2 26 01/16/2022   BUN 16 01/16/2022   CREATININE 0.78 01/16/2022   GFRNONAA >60 01/16/2022   CALCIUM 9.7 01/16/2022   PROT 8.2 (H) 01/16/2022   ALBUMIN 4.2 01/16/2022   BILITOT 0.8 01/16/2022   ALKPHOS 140 (H) 01/16/2022   AST 16 01/16/2022   ALT 13 01/16/2022   ANIONGAP 8 01/16/2022     Assessment & Plan:    Routine Health Maintenance and Physical Exam  Immunization History  Administered Date(s) Administered   Influenza, Seasonal, Injecte, Preservative Fre 02/21/2011   Influenza-Unspecified 04/23/2019, 09/11/2019, 01/18/2022   PFIZER(Purple Top)SARS-COV-2 Vaccination 12/09/2019, 12/30/2019   Pneumococcal Polysaccharide-23 05/07/2019   Tdap 05/07/2019    Health Maintenance   Topic Date Due   Medicare Annual Wellness (AWV)  Never done   HIV Screening  Never done   Hepatitis C Screening  Never done   Zoster Vaccines- Shingrix (1 of 2)  Never done   PAP SMEAR-Modifier  Never done   COLONOSCOPY (Pts 45-24yr Insurance coverage will need to be confirmed)  Never done   COVID-19 Vaccine (3 - Pfizer risk series) 01/27/2020   MAMMOGRAM  06/27/2022   DTaP/Tdap/Td (2 - Td or Tdap) 05/06/2029   INFLUENZA VACCINE  Completed   HPV VACCINES  Aged Out    Discussed health benefits of physical activity, and encouraged her to engage in regular exercise appropriate for her age and condition.  Problem List Items Addressed This Visit     Essential hypertension   Relevant Medications   Bempedoic Acid-Ezetimibe (NEXLIZET) 180-10 MG TABS   Other Relevant Orders   CBC with Differential   Comprehensive metabolic panel   Chronic kidney disease (Chronic)   Relevant Orders   CBC with Differential   Comprehensive metabolic panel   Dyslipidemia   Relevant Medications   Bempedoic Acid-Ezetimibe (NEXLIZET) 180-10 MG TABS   Other Visit Diagnoses     Encounter for annual physical exam    -  Primary   Relevant Orders   POCT Urinalysis Dipstick ([id number] (Completed)   Vitamin D deficiency, unspecified       B12 deficiency due to diet       Hypothyroidism (acquired)       Relevant Orders   TSH   Encounter for screening for viral disease       Relevant Orders   Hepatitis C antibody screen   HIV antibody   Contact with and (suspected) exposure to infections with a predominantly sexual mode of transmission       Relevant Orders   HIV antibody   Prediabetes       Relevant Orders   Hemoglobin A1c   Routine general medical examination at a health care facility       Well woman exam with routine gynecological exam       Relevant Orders   IGP, Aptima HPV, rfx 16/18,45   Cervical cancer screening       Relevant Orders   IGP, Aptima HPV, rfx 16/18,45      Return in 3 months  (on 08/18/2022).     [place] FNP

## 2022-05-21 ENCOUNTER — Other Ambulatory Visit: Payer: Self-pay | Admitting: Internal Medicine

## 2022-05-21 LAB — HEMOGLOBIN A1C
Est. average glucose Bld gHb Est-mCnc: 108 mg/dL
Hgb A1c MFr Bld: 5.4 % (ref 4.8–5.6)

## 2022-05-21 LAB — COMPREHENSIVE METABOLIC PANEL
ALT: 10 IU/L (ref 0–32)
AST: 13 IU/L (ref 0–40)
Albumin/Globulin Ratio: 1.5 (ref 1.2–2.2)
Albumin: 4.6 g/dL (ref 3.8–4.9)
Alkaline Phosphatase: 137 IU/L — ABNORMAL HIGH (ref 44–121)
BUN/Creatinine Ratio: 15 (ref 9–23)
BUN: 10 mg/dL (ref 6–24)
Bilirubin Total: 0.3 mg/dL (ref 0.0–1.2)
CO2: 23 mmol/L (ref 20–29)
Calcium: 9.6 mg/dL (ref 8.7–10.2)
Chloride: 101 mmol/L (ref 96–106)
Creatinine, Ser: 0.65 mg/dL (ref 0.57–1.00)
Globulin, Total: 3.1 g/dL (ref 1.5–4.5)
Glucose: 78 mg/dL (ref 70–99)
Potassium: 4.1 mmol/L (ref 3.5–5.2)
Sodium: 141 mmol/L (ref 134–144)
Total Protein: 7.7 g/dL (ref 6.0–8.5)
eGFR: 107 mL/min/{1.73_m2} (ref >59)

## 2022-05-21 LAB — CBC WITH DIFFERENTIAL/PLATELET
Basophils Absolute: 0.1 10*3/uL (ref 0.0–0.2)
Basos: 1 %
EOS (ABSOLUTE): 0.1 10*3/uL (ref 0.0–0.4)
Eos: 2 %
Hematocrit: 39.5 % (ref 34.0–46.6)
Hemoglobin: 13 g/dL (ref 11.1–15.9)
Immature Grans (Abs): 0 10*3/uL (ref 0.0–0.1)
Immature Granulocytes: 0 %
Lymphocytes Absolute: 2.1 10*3/uL (ref 0.7–3.1)
Lymphs: 43 %
MCH: 29.2 pg (ref 26.6–33.0)
MCHC: 32.9 g/dL (ref 31.5–35.7)
MCV: 89 fL (ref 79–97)
Monocytes Absolute: 0.4 10*3/uL (ref 0.1–0.9)
Monocytes: 8 %
Neutrophils Absolute: 2.3 10*3/uL (ref 1.4–7.0)
Neutrophils: 46 %
Platelets: 386 10*3/uL (ref 150–450)
RBC: 4.45 x10E6/uL (ref 3.77–5.28)
RDW: 12.4 % (ref 11.7–15.4)
WBC: 5 10*3/uL (ref 3.4–10.8)

## 2022-05-21 LAB — HIV ANTIBODY (ROUTINE TESTING W REFLEX): HIV Screen 4th Generation wRfx: NONREACTIVE

## 2022-05-21 LAB — HEPATITIS C ANTIBODY: Hep C Virus Ab: NONREACTIVE

## 2022-05-21 LAB — TSH: TSH: 0.609 u[IU]/mL (ref 0.450–4.500)

## 2022-05-25 LAB — IGP, APTIMA HPV, RFX 16/18,45
HPV Aptima: NEGATIVE
PAP Smear Comment: 0

## 2022-05-25 LAB — SPECIMEN STATUS REPORT

## 2022-05-27 ENCOUNTER — Other Ambulatory Visit: Payer: Self-pay | Admitting: Internal Medicine

## 2022-05-30 ENCOUNTER — Telehealth: Payer: Self-pay

## 2022-05-30 NOTE — Telephone Encounter (Addendum)
HLD Review Call  [name],[name]  62 years, Female  DOB: [birth year]  M: [phone]  __________________________________________________ Hyperlipidemia/Dyslipidemia Review (HC) Chart Review Lipid Panel Date: 01/18/2022 TC: 254 LDL: 192 HDL: 40 TG: 123 Are TC > 200, LDL >100, HDL < 40, TG > 150?: Yes What recent interventions have been made by any provider to improve the patient's conditions in the last 3 months?: Office Visit: 05/20/22 [name] Claude, FNP For Annual Exam. No medication changes. Has there been any documented recent hospitalizations or ED visits since last visit with Clinical Lead?: No Adherence Review Does the [facility] have access to medication refill data?: Yes Adherence rates for STAR metric medications: None . Adherence rates for medications indicated for disease state being reviewed: None . Does the patient have >5 day gap between last estimated fill dates for any of the above medications?: No Disease State Questions Able to connect with the Patient?: Yes Are you having any side effects from your cholesterol medications?: No What diet changes have you made to improve your Cholesterol?: eating more home-cooked meals What exercise are you doing to improve your Cholesterol?: no formal exercise, other Details: Daily Chores  Engagement Notes [name] [name] on 05/30/2022 11:00 AM HC Chart Review: 5 min 05/29/22  HC Assessment call time spent: 5 min 05/29/22   Clinical Lead Review Review Adherence gaps identified?: No Drug Therapy Problems identified?: No Assessment: Uncontrolled   [name] [name],PharmD  Reviewed 34mns 37secs

## 2022-05-30 NOTE — Telephone Encounter (Deleted)
Diabetes (DM) Review Call  TheresaBraun  81 years, Female  DOB: 1959-02-01  M: [phone]  __________________________________________________ Diabetes (DM) Review (HC) Chart Review A1C Reading #1 (last): 9.2 on: 03/10/2022 A1C Reading #2: 9.5 on: 11/29/2021 When was patient's last eye exam?: 01/12/2022  The patient's glycemic control has: Worsened (> 0.3 increase) What recent interventions have been made by any provider to improve the patient's conditions in the last 3 months?: Office Visit: 04/22/22 Tejan-Sie-Sheikh A. MD. For follow-up. No medication changes. Has there been any documented recent hospitalizations or ED visits since last visit with Clinical Lead?: No Is the patient currently on a STATIN medication?: Yes Is the patient currently on ACE/ARB medication?: Yes Most recent Microalbumin / Creatinine ratio (MACR): 0.4 on: 01/27/2016 Adherence Review Does the [facility] have access to medication refill data?: Yes Adherence rates for STAR metric medications: Metformin 1000 mg - 04/29/21 90 DS Rosuvastatin 20 mg - 04/29/21 90 DS Ezetimibe 10 mg - 04/17/21 90 DS Adherence rates for medications indicated for disease state being reviewed: Metformin 1000 mg - 04/29/21 90 DS Does the patient have >5 day gap between last estimated fill dates for any of the above medications?: No Disease State Questions Able to connect with the Patient?: Yes Are you currently checking your blood sugars?: Yes How often are you checking your blood sugar?: occasionally Recent fasting blood sugars: 58-60 Range  Recent post-meal blood sugars: 200-300  Is the patient having any lows <70?: Yes What are the symptoms?: Dizzy and Light headed  Is the patient having any fasting blood sugars above >170?: Yes What are the symptoms?: Patient stated none .  Is the patient checking their feet daily/regularly?: Yes Are there any cuts, swelling, blisters, redness, or any signs of infections?: No When was your last  dentist appointment? (6 Month recommendation): About 1-2 years ago - March 5 th 2024  What diet changes have you made to improve your Blood Sugar level?: eating more home-cooked meals, limiting processed foods What exercise are you doing to improve your Blood Sugar level?: walking  Engagement Notes [name] [name] on 05/27/2022 02:56 PM R/S appointment 11/14/22 at 3:00 PM [name] [name] on 05/27/2022 11:51 AM HC Chart Review: 6 min 05/27/22  HC Assessment call time spent: 19 min 05/27/22  CPP Office Visit Documentation: CPP Coordination of Care: CPP Care Plan Review:  Clinical Lead Review Review Adherence gaps identified?: No Drug Therapy Problems identified?: No Assessment: Uncontrolled Plan: A1C>7  Engagement Notes [name] [name] on 05/30/2022 07:04 PM Reviewed : 10mns 1 sec

## 2022-06-20 ENCOUNTER — Ambulatory Visit: Payer: Medicare HMO

## 2022-06-20 ENCOUNTER — Other Ambulatory Visit: Payer: Self-pay | Admitting: Family

## 2022-06-20 DIAGNOSIS — G43E09 Chronic migraine with aura, not intractable, without status migrainosus: Secondary | ICD-10-CM

## 2022-06-20 DIAGNOSIS — K219 Gastro-esophageal reflux disease without esophagitis: Secondary | ICD-10-CM

## 2022-06-20 DIAGNOSIS — M546 Pain in thoracic spine: Secondary | ICD-10-CM

## 2022-06-20 DIAGNOSIS — I10 Essential (primary) hypertension: Secondary | ICD-10-CM

## 2022-06-20 DIAGNOSIS — G894 Chronic pain syndrome: Secondary | ICD-10-CM

## 2022-06-20 DIAGNOSIS — M545 Low back pain, unspecified: Secondary | ICD-10-CM

## 2022-06-20 DIAGNOSIS — Q8501 Neurofibromatosis, type 1: Secondary | ICD-10-CM

## 2022-06-20 DIAGNOSIS — E785 Hyperlipidemia, unspecified: Secondary | ICD-10-CM

## 2022-06-20 DIAGNOSIS — E559 Vitamin D deficiency, unspecified: Secondary | ICD-10-CM

## 2022-06-20 MED ORDER — METOPROLOL SUCCINATE ER 100 MG PO TB24: 100.0000 mg | ORAL_TABLET | Freq: Every day | ORAL | 3 refills | Status: DC

## 2022-06-20 MED ORDER — VITAMIN D (ERGOCALCIFEROL) 1.25 MG (50000 UNIT) PO CAPS: 50000.0000 [IU] | ORAL_CAPSULE | ORAL | 3 refills | Status: DC

## 2022-06-20 MED ORDER — ONDANSETRON 8 MG PO TBDP: ORAL_TABLET | ORAL | 3 refills | Status: DC

## 2022-06-20 MED ORDER — ALBUTEROL SULFATE HFA 108 (90 BASE) MCG/ACT IN AERS: 1.0000 | INHALATION_SPRAY | Freq: Four times a day (QID) | RESPIRATORY_TRACT | 5 refills | Status: AC | PRN

## 2022-06-20 MED ORDER — DESVENLAFAXINE SUCCINATE ER 50 MG PO TB24: 50.0000 mg | ORAL_TABLET | Freq: Every morning | ORAL | 3 refills | Status: DC

## 2022-06-20 MED ORDER — NEXLIZET 180-10 MG PO TABS: 1.0000 | ORAL_TABLET | Freq: Every day | ORAL | 1 refills | Status: DC

## 2022-06-20 MED ORDER — AMITRIPTYLINE HCL 50 MG PO TABS: 50.0000 mg | ORAL_TABLET | Freq: Every day | ORAL | 3 refills | Status: DC

## 2022-06-20 MED ORDER — GABAPENTIN 600 MG PO TABS: 600.0000 mg | ORAL_TABLET | Freq: Four times a day (QID) | ORAL | 5 refills | Status: DC

## 2022-06-20 MED ORDER — CYCLOBENZAPRINE HCL 10 MG PO TABS: 10.0000 mg | ORAL_TABLET | Freq: Three times a day (TID) | ORAL | 5 refills | Status: DC | PRN

## 2022-06-20 MED ORDER — SUMATRIPTAN SUCCINATE 100 MG PO TABS: ORAL_TABLET | ORAL | 1 refills | Status: DC

## 2022-06-20 MED ORDER — PANTOPRAZOLE SODIUM 40 MG PO TBEC: 40.0000 mg | DELAYED_RELEASE_TABLET | Freq: Every day | ORAL | 3 refills | Status: DC

## 2022-06-20 NOTE — Progress Notes (Signed)
Clinical Summary  Next Pharmacist Follow Up: 06/19/23 9AM - schedule calender not open yet , Focused pharmacist call in 89month  Next AWV: 08/18/2022 Summary for PCP: - Patient reports muscle pain from cholesterol medicine. Recommended trying CoQ10 '300mg'$  daily for 3 months and letting me know if that makes a difference, - Recommended increasing water intake - using flavors to make it more palatable. - Onboarded [facility] to [facility] - HC HLD assessment (Focus on CoQ10 impact) in 3 months and FPO in 6 months. .   Patient's Chronic Conditions: Hypertension (HTN), Chronic Kidney Disease (CKD), Vitamins/Supplements, Hyperlipidemia/Dyslipidemia (HLD), Gastroesophageal Reflux Disease (GERD)  Disease Assessments Visit Date Visit Completed on: 06/20/2022 Subjective Information Subjective: She started getting lightheaded and has headache on right side for 2-3 days. She got sick on her stomach. She took Imitrex 2 days ago, when it first started. It eased it up some.  She is disabled due to Neurofibromatosis in thigh, back and also scoliosis. She can do light housework, cooking. Her 2 sons live with her and help her out a great deal. She watches TV or goes out with family member. She are close family around and see them often. Lifestyle habits: Diet: She watches sodium. She bakes her meats or sometimes fries, she starts the day with juice, milk or water. She doesn't eat BF, first meal is at 11 or 12 noon. She cooks bacon, oatmeal or bowl of cereal. She cooks later lunch, evening meal. Thats also bacon, grilled chicken, hamburger, tKuwaitsandwich with cheese. She fixes 2 vegetables if she cooks dinner - mixed greens, baked beans, mashed potato. She doesn't really care for fruit.  Exercise: It is very limited. She can stand on her feet for 141ms before sitting down. At mall or grocery store, she has to take walker.  Tobacco and alcohol : never. What is the patient's sleep pattern?: Other (with  free form text) Details: Trouble sleeping, takes muscle relaxer helps to get sleep from 10am to 6am. May wake up for bathroom.  SDOH: Accountable Health Communities Health-Related Social Needs Screening Tool ([email or web address] questions were documented and reviewed (EMR or [employer]) within the past 12 months or since hospitalization?: No What is your living situation today? (ref #1): I have a steady place to live Think about the place you live. Do you have problems with any of the following? (ref #2): None of the above Within the past 12 months, you worried that your food would run out before you got money to buy more (ref #3): Sometimes true Within the past 12 months, the food you bought just didn't last and you didn't have money to get more (ref #4): Sometimes true In the past 12 months, has lack of reliable transportation kept you from medical appointments, meetings, work or from getting things needed for daily living? (ref #5): No In the past 12 months, has the electric, gas, oil, or water company threatened to shut off services in your home? (ref #6): No How often does anyone, including family and friends, physically hurt you? (ref #7): Never (1) How often does anyone, including family and friends, insult or talk down to you? (ref #8): Never (1) How often does anyone, including friends and family, threaten you with harm? (ref #9): Never (1) How often does anyone, including family and friends, scream or curse at you? (ref #10): Never (1) [facility]ication [facility] Yes Medication adherence rates for the STAR metric medications: None. Medication  adherence rates for non-STAR metric medications: None . Factors that may affect medication adherence?: Pill burden Is Patient using [facility] pharmacy?: No Name and location of Current pharmacy: [facility] in [place].  Current Rx insurance plan:  [employer] Are meds synced by current pharmacy?: No Are meds delivered by current pharmacy?: Yes - by local pharmacy Would patient benefit from direct intervention of clinical lead in dispensing process to optimize clinical outcomes?: No Additional Info: Nexlizet no copay We discussed: Use of adherence aids (pillbox, alarms) Assessment:: Non-adherent Hypertension (HTN) Most Recent BP: 124/74 Most Recent HR: 120 taken on: 05/20/2022 Care Gap: Need BP documented or last BP 140/90 or higher: Needs to be addressed Assessed today?: Yes BP today is: 106/80 Heart Rate is: 89 Goal: <130/80 mmHG Is Patient checking BP at home?: Yes Patient home BP readings are ranging: once or twice a week Has patient experienced hypotension, dizziness, falls or bradycardia?: No We discussed: Contacting PCP office for signs and symptoms of high or low blood pressure (hypotension, dizziness, falls, headaches, edema), Proper Home BP Measurement Assessment:: Controlled Drug: metoprolol 100 mg XL daily Pharmacist Assessment: Appropriate, Effective, Safe, Accessible Plan/Follow up: Order and Review : BP measurement and logs Hyperlipidemia/Dyslipidemia (HLD) Last Lipid panel on: 01/18/2022 TC (Goal<200): 294 LDL: 192 HDL (Goal>40): 40 TG (Goal<150): 123 ASCVD 10-year risk?is:: N/A due to lab values outside the range Assessed today?: No Drug: nexlizet daily Pharmacist Assessment: Appropriate, Query Effectiveness Plan/Follow up: coQ 10 '300mg'$  daily  GERD Assessment Assessed today?: No Drug: pantoprazole '40mg'$  daily at lunch  Chronic kidney disease (CKD) Most Recent GFR: 107 taken on: 05/20/2022 Previous GFR: 85 taken on: 01/18/2022 Assessed today?: No Preventative Health Care Gap: Colorectal cancer screening: Needs to be addressed Care Gap: Breast cancer screening: Needs to be addressed Care Gap: Annual Wellness Visit (AWV): Needs to be addressed Plan/Follow up: June : to set up colonoscopy Need to call  and schedule mammogram Pharmacist Interventions Intervention Details Pharmacist Interventions discussed: Yes Started Therapy: Preventative therapy Monitoring: Routine monitoring Engagement Notes Charlann Lange on 06/16/2022 10:16 AM HC Chart Review: 13 min 06/16/22 HC Assessment call time spent: 12 min 06/17/22   Jerral Ralph on 06/20/2022 11:50 AM CP Chart review 06/20/2022 14 min (8:40:29 AM 0:14:23) CP Televisit 06/20/2022 75 min  (8:54:52 AM 1:15:06) CP Documentation 06/20/2022 22 min (11:27:18 AM 0:22:26) CP Pharmacy Onboarding 06/20/2022 40mns (11:49:44 AM 0:20:22)

## 2022-06-30 ENCOUNTER — Ambulatory Visit
Payer: Medicare HMO | Attending: Student in an Organized Health Care Education/Training Program | Admitting: Student in an Organized Health Care Education/Training Program

## 2022-06-30 ENCOUNTER — Encounter: Payer: Self-pay | Admitting: Student in an Organized Health Care Education/Training Program

## 2022-06-30 VITALS — BP 108/92 | HR 120 | Temp 97.2°F | Resp 16 | Ht 61.5 in | Wt 145.0 lb

## 2022-06-30 DIAGNOSIS — M4127 Other idiopathic scoliosis, lumbosacral region: Secondary | ICD-10-CM | POA: Diagnosis not present

## 2022-06-30 DIAGNOSIS — M792 Neuralgia and neuritis, unspecified: Secondary | ICD-10-CM

## 2022-06-30 DIAGNOSIS — G894 Chronic pain syndrome: Secondary | ICD-10-CM

## 2022-06-30 DIAGNOSIS — Q8501 Neurofibromatosis, type 1: Secondary | ICD-10-CM | POA: Diagnosis not present

## 2022-06-30 DIAGNOSIS — M545 Low back pain, unspecified: Secondary | ICD-10-CM | POA: Insufficient documentation

## 2022-06-30 DIAGNOSIS — M546 Pain in thoracic spine: Secondary | ICD-10-CM | POA: Diagnosis not present

## 2022-06-30 MED ORDER — HYDROCODONE-ACETAMINOPHEN 7.5-325 MG PO TABS: 1.0000 | ORAL_TABLET | Freq: Every day | ORAL | 0 refills | Status: DC | PRN

## 2022-06-30 NOTE — Progress Notes (Signed)
PROVIDER NOTE: Information contained herein reflects review and annotations entered in association with encounter. Interpretation of such information and data should be left to medically-trained personnel. Information provided to patient can be located elsewhere in the medical record under "Patient Instructions". Document created using STT-dictation technology, any transcriptional errors that may result from process are unintentional.    Patient: [name] [name]  Service Category: E/M  Provider: Gillis Santa, MD  DOB: [birth year]  DOS: 06/30/2022  Specialty: Interventional Pain Management  MRN: [id number]  Setting: Ambulatory outpatient  PCP: Mechele Claude, FNP  Type: Established Patient    Referring Provider: Perrin Maltese, MD  Location: Office  Delivery: Face-to-face     HPI  [name], a 52 y.o. 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 [name] [name]'s primary complain today is Back Pain (Thoracic worse on the left 90%) and Hip Pain (Right )  Last encounter: My last encounter with her was on 04/07/22  Pertinent problems: [name] [name] has Back pain; Neurofibromatosis ([place]); Idiopathic scoliosis; Chest wall pain, chronic; Thoracic spine pain; Neurofibroma; Neuropathic pain; Neurofibromatosis, type 1 ([place]); and Chronic pain syndrome on their pertinent problem list. Pain Assessment: Severity of Chronic pain is reported as a 6 /10. Location: Back Mid, Left (hip right side)/hip pain goes down the right leg to approx the knee. Onset: More than a month ago. Quality: Constant, Discomfort, Sharp, Stabbing, Other (Comment) (electric). Timing: Constant. Modifying factor(s): medications and rest. Vitals:  height is 5' 1.5" (1.562 m) and weight is 145 lb (65.8 kg). Her temporal temperature is 97.2 F (36.2 C) (abnormal). Her blood pressure is 108/92 (abnormal) and her pulse is 120 (abnormal). Her respiration is 16 and oxygen  saturation is 100%.   Reason for encounter: medication management.   Patient continues multimodal pain regimen as prescribed.  States that it provides pain relief and improvement in functional status. No falls or visits to urgent care or emergency department Continues Gabapentin and Flexeril Takes Imitrex for abortive migraines 1-2x/month    Pharmacotherapy Assessment  Analgesic: Hydrocodone 7.5 mg twice daily as needed, quantity 45/month; MME equals 15    Monitoring: [name] [name] PMP: PDMP reviewed during this encounter.       Pharmacotherapy: No side-effects or adverse reactions reported. Compliance: No problems identified. Effectiveness: Clinically acceptable.  UDS:  Summary  Date Value Ref Range Status  12/16/2021 Note  Final    Comment:    ==================================================================== [employer] 13 (MW) ==================================================================== Test                             Result       Flag       Units  Drug Present and Declared for Prescription Verification   Hydrocodone                    984          EXPECTED   ng/mg creat   Hydromorphone                  205          EXPECTED   ng/mg creat   Dihydrocodeine                 80           EXPECTED   ng/mg creat   Norhydrocodone  1772         EXPECTED   ng/mg creat    Sources of hydrocodone include scheduled prescription medications.    Hydromorphone, dihydrocodeine and norhydrocodone are expected    metabolites of hydrocodone. Hydromorphone and dihydrocodeine are    also available as scheduled prescription medications.  ==================================================================== Test                      Result    Flag   Units      Ref Range   Creatinine              169              mg/dL      >=20 ==================================================================== Declared Medications:  The flagging and interpretation on this report are based  on the  following declared medications.  Unexpected results may arise from  inaccuracies in the declared medications.   **Note: The testing scope of this panel includes these medications:   Hydrocodone (Norco)   **Note: The testing scope of this panel does not include the  following reported medications:   Acetaminophen (Tylenol)  Acetaminophen (Norco)  Albuterol (Ventolin HFA)  Cyclobenzaprine (Flexeril)  Desvenlafaxine (Pristiq)  Gabapentin (Neurontin)  Ketorolac (Toradol)  Metoprolol (Toprol)  Nortriptyline (Pamelor)  Ondansetron (Zofran)  Oxybutynin (Ditropan)  Pantoprazole (Protonix)  Phenazopyridine (Pyridium)  Sumatriptan (Imitrex)  Tamsulosin (Flomax)  Vitamin D2 ==================================================================== For clinical consultation, please call [phone]. ====================================================================       ROS  Constitutional: Denies any fever or chills Gastrointestinal: No reported hemesis, hematochezia, vomiting, or acute GI distress Musculoskeletal:  Low back, bilateral hip pain Neurological: No reported episodes of acute onset apraxia, aphasia, dysarthria, agnosia, amnesia, paralysis, loss of coordination, or loss of consciousness  Medication Review  Bempedoic Acid-Ezetimibe, HYDROcodone-acetaminophen, SUMAtriptan, Vitamin D (Ergocalciferol), acetaminophen, albuterol, amitriptyline, cyclobenzaprine, desvenlafaxine, gabapentin, metoprolol succinate, ondansetron, and pantoprazole  History Review  Allergy: [name] [name] is allergic to peanuts [peanut oil] and penicillins. Drug: [name] [name]  reports no history of drug use. Alcohol:  reports no history of alcohol use. Tobacco:  reports that she has never smoked. She has never used smokeless tobacco. Social: [name] [name]  reports that she has never smoked. She has never used smokeless tobacco. She reports that she does not drink alcohol and does not use  drugs. Medical:  has a past medical history of H/O sleep apnea (07/09/2014), Hypertension, Left arm numbness (02/12/2020), Neurofibromatosis ([place]), Renal disorder, Scoliosis of thoracic spine, and Thyroid disease. Surgical: [name] [name]  has a past surgical history that includes Cesarean section; chamberlain procedure; and Thyroidectomy. Family: family history includes Cancer in her mother.  Laboratory Chemistry Profile   Renal Lab Results  Component Value Date   BUN 10 05/20/2022   CREATININE 0.65 05/20/2022   BCR 15 05/20/2022   GFRAA >60 05/24/2018   GFRNONAA >60 01/16/2022     Hepatic Lab Results  Component Value Date   AST 13 05/20/2022   ALT 10 05/20/2022   ALBUMIN 4.6 05/20/2022   ALKPHOS 137 (H) 05/20/2022   LIPASE 28 01/16/2022     Electrolytes Lab Results  Component Value Date   NA 141 05/20/2022   K 4.1 05/20/2022   CL 101 05/20/2022   CALCIUM 9.6 05/20/2022     Bone No results found for: "VD25OH", "VD125OH2TOT", "[id number]", "UK:060616", "25OHVITD1", "25OHVITD2", "25OHVITD3", "TESTOFREE", "TESTOSTERONE"   Inflammation (CRP: Acute Phase) (ESR: Chronic Phase) No results found for: "CRP", "ESRSEDRATE", "LATICACIDVEN"     Note:  Above Lab results reviewed.  Recent Imaging Review  MM 3D SCREEN BREAST BILATERAL CLINICAL DATA:  Screening.  EXAM: DIGITAL SCREENING BILATERAL MAMMOGRAM WITH TOMOSYNTHESIS AND CAD  TECHNIQUE: Bilateral screening digital craniocaudal and mediolateral oblique mammograms were obtained. Bilateral screening digital breast tomosynthesis was performed. The images were evaluated with computer-aided detection.  COMPARISON:  Previous exam(s).  ACR Breast Density Category b: There are scattered areas of fibroglandular density.  FINDINGS: There are no findings suspicious for malignancy. The images were evaluated with computer-aided detection.  IMPRESSION: No mammographic evidence of malignancy. A result letter of this screening  mammogram will be mailed directly to the patient.  RECOMMENDATION: Screening mammogram in one year. (Code:SM-B-01Y)  BI-RADS CATEGORY  1: Negative.  Electronically Signed   By: Lillia Mountain M.D.   On: 06/29/2020 13:06  Note: Reviewed        Physical Exam  General appearance: Well nourished, well developed, and well hydrated. In no apparent acute distress Mental status: Alert, oriented x 3 (person, place, & time)       Respiratory: No evidence of acute respiratory distress Eyes: PERLA Vitals: BP (!) 108/92 (BP Location: Right Arm, Patient Position: Sitting, Cuff Size: Normal)   Pulse (!) 120 Comment: stethoscope  Temp (!) 97.2 F (36.2 C) (Temporal)   Resp 16   Ht 5' 1.5" (1.562 m)   Wt 145 lb (65.8 kg)   SpO2 100%   BMI 26.95 kg/m  BMI: Estimated body mass index is 26.95 kg/m as calculated from the following:   Height as of this encounter: 5' 1.5" (1.562 m).   Weight as of this encounter: 145 lb (65.8 kg). Ideal: Ideal body weight: 48.9 kg (107 lb 14.6 oz) Adjusted ideal body weight: 55.7 kg (122 lb 12 oz)  +low back pain, bilateral hip pain  5 out of 5 strength bilateral lower extremity: Plantar flexion, dorsiflexion, knee flexion, knee extension.   Assessment   Status Diagnosis  Controlled Controlled Controlled 1. Neurofibromatosis, type 1 (von Recklinghausen's disease) ([place])   2. Neuropathic pain   3. Back pain of thoracolumbar region   4. Other idiopathic scoliosis, lumbosacral region   5. Chronic pain syndrome           Plan of Care   [name] [name] has a current medication list which includes the following long-term medication(s): albuterol, amitriptyline, desvenlafaxine, gabapentin, metoprolol succinate, pantoprazole, sumatriptan, [START ON 07/06/2022] hydrocodone-acetaminophen, [START ON 08/05/2022] hydrocodone-acetaminophen, and [START ON 09/04/2022] hydrocodone-acetaminophen.  Pharmacotherapy (Medications Ordered): Meds ordered this encounter   Medications   HYDROcodone-acetaminophen (NORCO) 7.5-325 MG tablet    Sig: Take 1-2 tablets by mouth daily as needed for severe pain. Must last 30 days.    Dispense:  45 tablet    Refill:  0    Chronic Pain. (STOP Act - Not applicable). Fill one day early if closed on scheduled refill date.   HYDROcodone-acetaminophen (NORCO) 7.5-325 MG tablet    Sig: Take 1-2 tablets by mouth daily as needed for severe pain. Must last 30 days.    Dispense:  45 tablet    Refill:  0    Chronic Pain. (STOP Act - Not applicable). Fill one day early if closed on scheduled refill date.   HYDROcodone-acetaminophen (NORCO) 7.5-325 MG tablet    Sig: Take 1-2 tablets by mouth daily as needed for severe pain. Must last 30 days.    Dispense:  45 tablet    Refill:  0    Chronic Pain. (STOP Act - Not  applicable). Fill one day early if closed on scheduled refill date.   Continue Gabapentin and Flexeril as Rx'd no refills needed  No orders of the defined types were placed in this encounter.    Follow-up plan:   Return in about 14 weeks (around 10/06/2022) for Medication Management, in person.   Recent Visits Date Type Provider Dept  04/07/22 Office Visit Gillis Santa, MD [facility]  Showing recent visits within past 90 days and meeting all other requirements Today's Visits Date Type Provider Dept  06/30/22 Office Visit Gillis Santa, MD [facility]  Showing today's visits and meeting all other requirements Future Appointments Date Type Provider Dept  09/27/22 Appointment Gillis Santa, MD [facility]  Showing future appointments within next 90 days and meeting all other requirements  I discussed the assessment and treatment plan with the patient. The patient was provided an opportunity to ask questions and all were answered. The patient agreed with the plan and demonstrated an understanding of the instructions.  Patient advised to call back or seek an in-person evaluation if  the symptoms or condition worsens.  Duration of encounter:30 minutes.  Note by: Gillis Santa, MD Date: 06/30/2022; Time: 11:25 AM

## 2022-06-30 NOTE — Progress Notes (Signed)
Nursing Pain Medication Assessment:  Safety precautions to be maintained throughout the outpatient stay will include: orient to surroundings, keep bed in low position, maintain call bell within reach at all times, provide assistance with transfer out of bed and ambulation.  Medication Inspection Compliance: [name] [name] did not comply with our request to bring her pills to be counted. She was reminded that bringing the medication bottles, even when empty, is a requirement.  Medication: None brought in. Pill/Patch Count: None available to be counted. Bottle Appearance: No container available. Did not bring bottle(s) to appointment. Filled Date: N/A Last Medication intake:  Yesterday

## 2022-07-09 ENCOUNTER — Other Ambulatory Visit: Payer: Self-pay | Admitting: Family

## 2022-07-09 DIAGNOSIS — K219 Gastro-esophageal reflux disease without esophagitis: Secondary | ICD-10-CM

## 2022-07-10 DIAGNOSIS — I10 Essential (primary) hypertension: Secondary | ICD-10-CM | POA: Diagnosis not present

## 2022-07-10 DIAGNOSIS — E785 Hyperlipidemia, unspecified: Secondary | ICD-10-CM | POA: Diagnosis not present

## 2022-07-10 DIAGNOSIS — G894 Chronic pain syndrome: Secondary | ICD-10-CM | POA: Diagnosis not present

## 2022-08-08 ENCOUNTER — Other Ambulatory Visit: Payer: Self-pay | Admitting: Student in an Organized Health Care Education/Training Program

## 2022-08-08 ENCOUNTER — Other Ambulatory Visit: Payer: Self-pay | Admitting: Internal Medicine

## 2022-08-08 DIAGNOSIS — G894 Chronic pain syndrome: Secondary | ICD-10-CM

## 2022-08-08 DIAGNOSIS — Z1231 Encounter for screening mammogram for malignant neoplasm of breast: Secondary | ICD-10-CM

## 2022-08-08 DIAGNOSIS — M546 Pain in thoracic spine: Secondary | ICD-10-CM

## 2022-08-08 DIAGNOSIS — Q8501 Neurofibromatosis, type 1: Secondary | ICD-10-CM

## 2022-08-10 DIAGNOSIS — Q8501 Neurofibromatosis, type 1: Secondary | ICD-10-CM | POA: Diagnosis not present

## 2022-08-10 DIAGNOSIS — M542 Cervicalgia: Secondary | ICD-10-CM | POA: Diagnosis not present

## 2022-08-10 DIAGNOSIS — R519 Headache, unspecified: Secondary | ICD-10-CM | POA: Diagnosis not present

## 2022-08-15 ENCOUNTER — Telehealth: Payer: Self-pay | Admitting: Student in an Organized Health Care Education/Training Program

## 2022-08-15 ENCOUNTER — Other Ambulatory Visit: Payer: Self-pay | Admitting: *Deleted

## 2022-08-15 NOTE — Telephone Encounter (Signed)
Did not need a PA. Called pharmacy and they can send it out tomorrow. Patient called with this info and says she will call the pharmacy and have them mail out tomorrow.

## 2022-08-15 NOTE — Telephone Encounter (Signed)
PT stated that she spoke with pharmacy and was told that they are waiting on PA for Flexeril prescription. I advise the patient that it seems another provider was prescribing her this medication. PT stated that Lateef took over. Please give patient a call. TY

## 2022-08-16 ENCOUNTER — Ambulatory Visit
Admission: RE | Admit: 2022-08-16 | Discharge: 2022-08-16 | Disposition: A | Discharge status: Home / Self Care | Payer: Medicare HMO | Source: Ambulatory Visit | Attending: Internal Medicine | Admitting: Internal Medicine

## 2022-08-16 DIAGNOSIS — Z1231 Encounter for screening mammogram for malignant neoplasm of breast: Secondary | ICD-10-CM | POA: Insufficient documentation

## 2022-08-18 ENCOUNTER — Ambulatory Visit: Payer: Medicare HMO | Admitting: Family

## 2022-08-25 ENCOUNTER — Other Ambulatory Visit: Payer: Self-pay | Admitting: Family

## 2022-08-25 DIAGNOSIS — G43E09 Chronic migraine with aura, not intractable, without status migrainosus: Secondary | ICD-10-CM

## 2022-09-12 ENCOUNTER — Other Ambulatory Visit: Payer: Self-pay | Admitting: Family

## 2022-09-12 DIAGNOSIS — K219 Gastro-esophageal reflux disease without esophagitis: Secondary | ICD-10-CM | POA: Diagnosis not present

## 2022-09-12 DIAGNOSIS — Z1211 Encounter for screening for malignant neoplasm of colon: Secondary | ICD-10-CM | POA: Diagnosis not present

## 2022-09-12 DIAGNOSIS — R1310 Dysphagia, unspecified: Secondary | ICD-10-CM | POA: Diagnosis not present

## 2022-09-20 ENCOUNTER — Telehealth: Payer: Self-pay

## 2022-09-20 NOTE — Telephone Encounter (Signed)
HLD Review Call  [name],[name]  52 years, Female  DOB: 1970/12/28  M: [phone]  __________________________________________________ Hyperlipidemia/Dyslipidemia Review (HC) Chart Review Lipid Panel Date: 01/18/2022 TC: 294 LDL: 192 HDL: 40 TG: 123 Are TC > 200, LDL >100, HDL < 40, TG > 150?: Yes What recent interventions have been made by any provider to improve the patient's conditions in the last 3 months?: Added CoQ-10 300mg  daily for 3 months. Has there been any documented recent hospitalizations or ED visits since last visit with Clinical Lead?: No Adherence Review Does the [facility] have access to medication refill data?: No Disease State Questions Able to connect with the Patient?: Yes Are you having any side effects from your cholesterol medications?: No What diet changes have you made to improve your Cholesterol?: limiting processed foods, other, eating more fruits and vegetables Details: Cutting back on red meat to 1-2 times per week  What exercise are you doing to improve your Cholesterol?: walking, no formal exercise Misc. Response/Information:: **Patient states that she has not started taking the COQ-10 as discussed, however her pills are now in pill packs and they include her Nexlizet 180-10, and her muscle/ joint pain has gone away since she is taking her medications regularly. She is trying to get out more than just walking to the mailbox and back. Engagement Notes dimock, eugenia on 09/20/2022 10:32 AM   CCM Billed Time: HC HLD Review Call: 10 minutes (09/20/22)   dimock, eugenia on 09/19/2022 01:04 PM LVM requesting patients return call. Laury Axon on 06/21/2022 07:53 AM HC HLD assessment (Focus on CoQ10 impact) in 3 months Clinical Lead Review Review Adherence gaps identified?: No Drug Therapy Problems identified?: No Assessment: Controlled Engagement Notes Lynann Bologna on 09/20/2022 04:37 PM Reviewed 

## 2022-09-27 ENCOUNTER — Ambulatory Visit
Payer: Medicare HMO | Attending: Student in an Organized Health Care Education/Training Program | Admitting: Student in an Organized Health Care Education/Training Program

## 2022-09-27 ENCOUNTER — Encounter: Payer: Self-pay | Admitting: Student in an Organized Health Care Education/Training Program

## 2022-09-27 VITALS — BP 122/93 | HR 84 | Temp 97.3°F | Resp 17 | Ht 61.5 in | Wt 145.0 lb

## 2022-09-27 DIAGNOSIS — M545 Low back pain, unspecified: Secondary | ICD-10-CM

## 2022-09-27 DIAGNOSIS — G894 Chronic pain syndrome: Secondary | ICD-10-CM

## 2022-09-27 DIAGNOSIS — M4127 Other idiopathic scoliosis, lumbosacral region: Secondary | ICD-10-CM

## 2022-09-27 DIAGNOSIS — M546 Pain in thoracic spine: Secondary | ICD-10-CM | POA: Diagnosis not present

## 2022-09-27 DIAGNOSIS — Q8501 Neurofibromatosis, type 1: Secondary | ICD-10-CM

## 2022-09-27 DIAGNOSIS — M792 Neuralgia and neuritis, unspecified: Secondary | ICD-10-CM | POA: Diagnosis not present

## 2022-09-27 MED ORDER — HYDROCODONE-ACETAMINOPHEN 7.5-325 MG PO TABS: 1.0000 | ORAL_TABLET | Freq: Every day | ORAL | 0 refills | Status: DC | PRN

## 2022-09-27 NOTE — Progress Notes (Signed)
Nursing Pain Medication Assessment:  Safety precautions to be maintained throughout the outpatient stay will include: orient to surroundings, keep bed in low position, maintain call bell within reach at all times, provide assistance with transfer out of bed and ambulation.  Medication Inspection Compliance: Pill count conducted under aseptic conditions, in front of the patient. Neither the pills nor the bottle was removed from the patient's sight at any time. Once count was completed pills were immediately returned to the patient in their original bottle.  Medication: Hydrocodone/APAP Pill/Patch Count:  12 of 45 pills remain Pill/Patch Appearance: Markings consistent with prescribed medication Bottle Appearance: Standard pharmacy container. Clearly labeled. Filled Date: 5 / 27 / 2024 Last Medication intake:  Yesterday

## 2022-09-27 NOTE — Progress Notes (Signed)
PROVIDER NOTE: Information contained herein reflects review and annotations entered in association with encounter. Interpretation of such information and data should be left to medically-trained personnel. Information provided to patient can be located elsewhere in the medical record under "Patient Instructions". Document created using STT-dictation technology, any transcriptional errors that may result from process are unintentional.    Patient: [name] [name]  Service Category: E/M  Provider: Edward Jolly, MD  DOB: Apr 29, 1970  DOS: 09/27/2022  Specialty: Interventional Pain Management  MRN: [id number]  Setting: Ambulatory outpatient  PCP: Miki Kins, FNP  Type: Established Patient    Referring Provider: Miki Kins, FNP  Location: Office  Delivery: Face-to-face     HPI  [name], a 52 y.o. 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 [name]'s primary complain today is Back Pain  Last encounter: My last encounter with her was on 06/30/22  Pertinent problems: [name] has Back pain; Neurofibromatosis (HCC); Idiopathic scoliosis; Chest wall pain, chronic; Thoracic spine pain; Neurofibroma; Neuropathic pain; Neurofibromatosis, type 1 (HCC); and Chronic pain syndrome on their pertinent problem list. Pain Assessment: Severity of Chronic pain is reported as a 3 /10. Location: Back Upper/radiate through left flank to LUQ. Onset: More than a month ago. Quality: Burning, Sharp, Other (Comment) ("electric feeling"). Timing: Constant. Modifying factor(s): meds. Vitals:  height is 5' 1.5" (1.562 m) and weight is 145 lb (65.8 kg). Her temperature is 97.3 F (36.3 C) (abnormal). Her blood pressure is 122/93 (abnormal) and her pulse is 84. Her respiration is 17 and oxygen saturation is 100%.   Reason for encounter: medication management.   Patient continues multimodal pain regimen as prescribed.  States that it  provides pain relief and improvement in functional status. No falls or visits to urgent care or emergency department Continues Gabapentin and Flexeril     Pharmacotherapy Assessment  Analgesic: Hydrocodone 7.5 mg twice daily as needed, quantity 45/month; MME equals 15    Monitoring: [place] PMP: PDMP reviewed during this encounter.       Pharmacotherapy: No side-effects or adverse reactions reported. Compliance: No problems identified. Effectiveness: Clinically acceptable.  UDS:  Summary  Date Value Ref Range Status  12/16/2021 Note  Final    Comment:    ==================================================================== [employer] Select 13 (MW) ==================================================================== Test                             Result       Flag       Units  Drug Present and Declared for Prescription Verification   Hydrocodone                    984          EXPECTED   ng/mg creat   Hydromorphone                  205          EXPECTED   ng/mg creat   Dihydrocodeine                 80           EXPECTED   ng/mg creat   Norhydrocodone                 1772         EXPECTED   ng/mg creat    Sources of hydrocodone  include scheduled prescription medications.    Hydromorphone, dihydrocodeine and norhydrocodone are expected    metabolites of hydrocodone. Hydromorphone and dihydrocodeine are    also available as scheduled prescription medications.  ==================================================================== Test                      Result    Flag   Units      Ref Range   Creatinine              169              mg/dL      >=16 ==================================================================== Declared Medications:  The flagging and interpretation on this report are based on the  following declared medications.  Unexpected results may arise from  inaccuracies in the declared medications.   **Note: The testing scope of this panel includes these  medications:   Hydrocodone (Norco)   **Note: The testing scope of this panel does not include the  following reported medications:   Acetaminophen (Tylenol)  Acetaminophen (Norco)  Albuterol (Ventolin HFA)  Cyclobenzaprine (Flexeril)  Desvenlafaxine (Pristiq)  Gabapentin (Neurontin)  Ketorolac (Toradol)  Metoprolol (Toprol)  Nortriptyline (Pamelor)  Ondansetron (Zofran)  Oxybutynin (Ditropan)  Pantoprazole (Protonix)  Phenazopyridine (Pyridium)  Sumatriptan (Imitrex)  Tamsulosin (Flomax)  Vitamin D2 ==================================================================== For clinical consultation, please call [phone]. ====================================================================       ROS  Constitutional: Denies any fever or chills Gastrointestinal: No reported hemesis, hematochezia, vomiting, or acute GI distress Musculoskeletal:  Low back, bilateral hip pain Neurological: No reported episodes of acute onset apraxia, aphasia, dysarthria, agnosia, amnesia, paralysis, loss of coordination, or loss of consciousness  Medication Review  Bempedoic Acid-Ezetimibe, HYDROcodone-acetaminophen, SUMAtriptan, Vitamin D (Ergocalciferol), acetaminophen, albuterol, amitriptyline, cyclobenzaprine, desvenlafaxine, gabapentin, metoprolol succinate, ondansetron, and pantoprazole  History Review  Allergy: [name] is allergic to peanuts [peanut oil] and penicillins. Drug: [name]  reports no history of drug use. Alcohol:  reports no history of alcohol use. Tobacco:  reports that she has never smoked. She has never used smokeless tobacco. Social: [name]  reports that she has never smoked. She has never used smokeless tobacco. She reports that she does not drink alcohol and does not use drugs. Medical:  has a past medical history of H/O sleep apnea (07/09/2014), Hypertension, Left arm numbness (02/12/2020), Neurofibromatosis (HCC), Renal disorder, Scoliosis of thoracic  spine, and Thyroid disease. Surgical: [name]  has a past surgical history that includes Cesarean section; chamberlain procedure; and Thyroidectomy. Family: family history includes Cancer in her mother.  Laboratory Chemistry Profile   Renal Lab Results  Component Value Date   BUN 10 05/20/2022   CREATININE 0.65 05/20/2022   BCR 15 05/20/2022   GFRAA >60 05/24/2018   GFRNONAA >60 01/16/2022     Hepatic Lab Results  Component Value Date   AST 13 05/20/2022   ALT 10 05/20/2022   ALBUMIN 4.6 05/20/2022   ALKPHOS 137 (H) 05/20/2022   LIPASE 28 01/16/2022     Electrolytes Lab Results  Component Value Date   NA 141 05/20/2022   K 4.1 05/20/2022   CL 101 05/20/2022   CALCIUM 9.6 05/20/2022     Bone No results found for: "VD25OH", "VD125OH2TOT", "WJ1914NW2", "NF6213YQ6", "25OHVITD1", "25OHVITD2", "25OHVITD3", "TESTOFREE", "TESTOSTERONE"   Inflammation (CRP: Acute Phase) (ESR: Chronic Phase) No results found for: "CRP", "ESRSEDRATE", "LATICACIDVEN"     Note: Above Lab results reviewed.  Recent Imaging Review  MM 3D SCREENING MAMMOGRAM BILATERAL BREAST CLINICAL DATA:  Screening.  EXAM: DIGITAL SCREENING BILATERAL MAMMOGRAM WITH TOMOSYNTHESIS AND CAD  TECHNIQUE: Bilateral screening digital craniocaudal and mediolateral oblique mammograms were obtained. Bilateral screening digital breast tomosynthesis was performed. The images were evaluated with computer-aided detection.  COMPARISON:  Previous exam(s).  ACR Breast Density Category b: There are scattered areas of fibroglandular density.  FINDINGS: There are no findings suspicious for malignancy.  IMPRESSION: No mammographic evidence of malignancy. A result letter of this screening mammogram will be mailed directly to the patient.  RECOMMENDATION: Screening mammogram in one year. (Code:SM-B-01Y)  BI-RADS CATEGORY  1: Negative.  Electronically Signed   By: Edwin Cap M.D.   On: 08/17/2022  11:50  Note: Reviewed        Physical Exam  General appearance: Well nourished, well developed, and well hydrated. In no apparent acute distress Mental status: Alert, oriented x 3 (person, place, & time)       Respiratory: No evidence of acute respiratory distress Eyes: PERLA Vitals: BP (!) 122/93   Pulse 84   Temp (!) 97.3 F (36.3 C)   Resp 17   Ht 5' 1.5" (1.562 m)   Wt 145 lb (65.8 kg)   SpO2 100%   BMI 26.95 kg/m  BMI: Estimated [name] [name] mass index is 26.95 kg/m as calculated from the following:   Height as of this encounter: 5' 1.5" (1.562 m).   Weight as of this encounter: 145 lb (65.8 kg). Ideal: Ideal [name] [name] weight: 48.9 kg (107 lb 14.6 oz) Adjusted ideal [name] [name] weight: 55.7 kg (122 lb 12 oz)  +low back pain, bilateral hip pain  5 out of 5 strength bilateral lower extremity: Plantar flexion, dorsiflexion, knee flexion, knee extension.   Assessment   Status Diagnosis  Controlled Controlled Controlled 1. Neurofibromatosis, type 1 (von Recklinghausen's disease) (HCC)   2. Neuropathic pain   3. Back pain of thoracolumbar region   4. Other idiopathic scoliosis, lumbosacral region   5. Chronic pain syndrome       Plan of Care   [name] has a current medication list which includes the following long-term medication(s): albuterol, amitriptyline, desvenlafaxine, gabapentin, metoprolol succinate, pantoprazole, sumatriptan, [START ON 10/05/2022] hydrocodone-acetaminophen, [START ON 11/04/2022] hydrocodone-acetaminophen, and [START ON 12/04/2022] hydrocodone-acetaminophen.  Pharmacotherapy (Medications Ordered): Meds ordered this encounter  Medications   HYDROcodone-acetaminophen (NORCO) 7.5-325 MG tablet    Sig: Take 1-2 tablets by mouth daily as needed for severe pain. Must last 30 days.    Dispense:  45 tablet    Refill:  0    Chronic Pain. (STOP Act - Not applicable). Fill one day early if closed on scheduled refill date.   HYDROcodone-acetaminophen (NORCO)  7.5-325 MG tablet    Sig: Take 1-2 tablets by mouth daily as needed for severe pain. Must last 30 days.    Dispense:  45 tablet    Refill:  0    Chronic Pain. (STOP Act - Not applicable). Fill one day early if closed on scheduled refill date.   HYDROcodone-acetaminophen (NORCO) 7.5-325 MG tablet    Sig: Take 1-2 tablets by mouth daily as needed for severe pain. Must last 30 days.    Dispense:  45 tablet    Refill:  0    Chronic Pain. (STOP Act - Not applicable). Fill one day early if closed on scheduled refill date.  Continue with multimodal analgesics as managed by PCP.  Including amitriptyline, Flexeril, gabapentin. No orders of the defined types were placed in this encounter.    Follow-up plan:  Return in about 3 months (around 12/29/2022) for Medication Management, in person.   Recent Visits Date Type Provider Dept  06/30/22 Office Visit Edward Jolly, MD [facility]  Showing recent visits within past 90 days and meeting all other requirements Today's Visits Date Type Provider Dept  09/27/22 Office Visit Edward Jolly, MD [facility]  Showing today's visits and meeting all other requirements Future Appointments Date Type Provider Dept  12/22/22 Appointment Edward Jolly, MD [facility]  Showing future appointments within next 90 days and meeting all other requirements  I discussed the assessment and treatment plan with the patient. The patient was provided an opportunity to ask questions and all were answered. The patient agreed with the plan and demonstrated an understanding of the instructions.  Patient advised to call back or seek an in-person evaluation if the symptoms or condition worsens.  Duration of encounter:30 minutes.  Note by: Edward Jolly, MD Date: 09/27/2022; Time: 11:42 AM

## 2022-11-30 ENCOUNTER — Other Ambulatory Visit: Payer: Self-pay | Admitting: Family

## 2022-11-30 DIAGNOSIS — Q8501 Neurofibromatosis, type 1: Secondary | ICD-10-CM

## 2022-12-08 ENCOUNTER — Encounter: Payer: Self-pay | Admitting: Gastroenterology

## 2022-12-15 ENCOUNTER — Ambulatory Visit: Payer: Medicare HMO | Admitting: Anesthesiology

## 2022-12-15 ENCOUNTER — Ambulatory Visit
Admission: RE | Admit: 2022-12-15 | Discharge: 2022-12-15 | Disposition: A | Discharge status: Home / Self Care | Payer: Medicare HMO | Attending: Gastroenterology | Admitting: Gastroenterology

## 2022-12-15 ENCOUNTER — Encounter
Admission: RE | Disposition: A | Discharge status: Home / Self Care | Payer: Self-pay | Source: Home / Self Care | Attending: Gastroenterology

## 2022-12-15 ENCOUNTER — Encounter: Payer: Self-pay | Admitting: Gastroenterology

## 2022-12-15 DIAGNOSIS — K224 Dyskinesia of esophagus: Secondary | ICD-10-CM | POA: Diagnosis not present

## 2022-12-15 DIAGNOSIS — K5730 Diverticulosis of large intestine without perforation or abscess without bleeding: Secondary | ICD-10-CM | POA: Insufficient documentation

## 2022-12-15 DIAGNOSIS — Q8501 Neurofibromatosis, type 1: Secondary | ICD-10-CM | POA: Diagnosis not present

## 2022-12-15 DIAGNOSIS — I129 Hypertensive chronic kidney disease with stage 1 through stage 4 chronic kidney disease, or unspecified chronic kidney disease: Secondary | ICD-10-CM | POA: Diagnosis not present

## 2022-12-15 DIAGNOSIS — K635 Polyp of colon: Secondary | ICD-10-CM | POA: Diagnosis not present

## 2022-12-15 DIAGNOSIS — R519 Headache, unspecified: Secondary | ICD-10-CM | POA: Insufficient documentation

## 2022-12-15 DIAGNOSIS — K2970 Gastritis, unspecified, without bleeding: Secondary | ICD-10-CM | POA: Diagnosis not present

## 2022-12-15 DIAGNOSIS — K2960 Other gastritis without bleeding: Secondary | ICD-10-CM | POA: Diagnosis not present

## 2022-12-15 DIAGNOSIS — K5790 Diverticulosis of intestine, part unspecified, without perforation or abscess without bleeding: Secondary | ICD-10-CM | POA: Diagnosis not present

## 2022-12-15 DIAGNOSIS — K317 Polyp of stomach and duodenum: Secondary | ICD-10-CM | POA: Diagnosis not present

## 2022-12-15 DIAGNOSIS — G4730 Sleep apnea, unspecified: Secondary | ICD-10-CM | POA: Diagnosis not present

## 2022-12-15 DIAGNOSIS — J45909 Unspecified asthma, uncomplicated: Secondary | ICD-10-CM | POA: Diagnosis not present

## 2022-12-15 DIAGNOSIS — I10 Essential (primary) hypertension: Secondary | ICD-10-CM | POA: Insufficient documentation

## 2022-12-15 DIAGNOSIS — N1831 Chronic kidney disease, stage 3a: Secondary | ICD-10-CM | POA: Diagnosis not present

## 2022-12-15 DIAGNOSIS — K644 Residual hemorrhoidal skin tags: Secondary | ICD-10-CM | POA: Diagnosis not present

## 2022-12-15 DIAGNOSIS — Z1211 Encounter for screening for malignant neoplasm of colon: Secondary | ICD-10-CM | POA: Diagnosis not present

## 2022-12-15 DIAGNOSIS — K6289 Other specified diseases of anus and rectum: Secondary | ICD-10-CM | POA: Diagnosis not present

## 2022-12-15 DIAGNOSIS — K621 Rectal polyp: Secondary | ICD-10-CM | POA: Diagnosis not present

## 2022-12-15 DIAGNOSIS — K640 First degree hemorrhoids: Secondary | ICD-10-CM | POA: Insufficient documentation

## 2022-12-15 DIAGNOSIS — R1310 Dysphagia, unspecified: Secondary | ICD-10-CM | POA: Insufficient documentation

## 2022-12-15 DIAGNOSIS — K2289 Other specified disease of esophagus: Secondary | ICD-10-CM | POA: Diagnosis not present

## 2022-12-15 DIAGNOSIS — K222 Esophageal obstruction: Secondary | ICD-10-CM | POA: Diagnosis not present

## 2022-12-15 DIAGNOSIS — K319 Disease of stomach and duodenum, unspecified: Secondary | ICD-10-CM | POA: Diagnosis not present

## 2022-12-15 HISTORY — PX: ESOPHAGOGASTRODUODENOSCOPY (EGD) WITH PROPOFOL: SHX5813

## 2022-12-15 HISTORY — PX: MALONEY DILATION: SHX5535

## 2022-12-15 HISTORY — DX: Unspecified asthma, uncomplicated: J45.909

## 2022-12-15 HISTORY — DX: Headache, unspecified: R51.9

## 2022-12-15 HISTORY — PX: BIOPSY: SHX5522

## 2022-12-15 HISTORY — PX: COLONOSCOPY WITH PROPOFOL: SHX5780

## 2022-12-15 HISTORY — PX: POLYPECTOMY: SHX5525

## 2022-12-15 SURGERY — COLONOSCOPY WITH PROPOFOL
Anesthesia: General

## 2022-12-15 MED ORDER — PHENYLEPHRINE HCL (PRESSORS) 10 MG/ML IV SOLN
INTRAVENOUS | Status: DC | PRN
  Administered 2022-12-15 (×2): 80 ug via INTRAVENOUS
  Administered 2022-12-15: 160 ug via INTRAVENOUS
  Administered 2022-12-15 (×3): 80 ug via INTRAVENOUS

## 2022-12-15 MED ORDER — SODIUM CHLORIDE 0.9 % IV SOLN: INTRAVENOUS | Status: DC

## 2022-12-15 MED ORDER — LIDOCAINE HCL (CARDIAC) PF 100 MG/5ML IV SOSY
PREFILLED_SYRINGE | INTRAVENOUS | Status: DC | PRN
  Administered 2022-12-15: 100 mg via INTRAVENOUS

## 2022-12-15 MED ORDER — PHENYLEPHRINE 80 MCG/ML (10ML) SYRINGE FOR IV PUSH (FOR BLOOD PRESSURE SUPPORT)
PREFILLED_SYRINGE | INTRAVENOUS | Status: AC
  Filled 2022-12-15: qty 10

## 2022-12-15 MED ORDER — PROPOFOL 10 MG/ML IV BOLUS
INTRAVENOUS | Status: AC
  Filled 2022-12-15: qty 40

## 2022-12-15 MED ORDER — ESMOLOL HCL 100 MG/10ML IV SOLN
INTRAVENOUS | Status: AC
  Filled 2022-12-15: qty 10

## 2022-12-15 MED ORDER — LIDOCAINE HCL (PF) 2 % IJ SOLN
INTRAMUSCULAR | Status: AC
  Filled 2022-12-15: qty 5

## 2022-12-15 MED ORDER — PROPOFOL 10 MG/ML IV BOLUS
INTRAVENOUS | Status: DC | PRN
  Administered 2022-12-15 (×2): 10 mg via INTRAVENOUS
  Administered 2022-12-15: 150 ug/kg/min via INTRAVENOUS
  Administered 2022-12-15: 10 mg via INTRAVENOUS
  Administered 2022-12-15: 100 mg via INTRAVENOUS
  Administered 2022-12-15 (×8): 10 mg via INTRAVENOUS

## 2022-12-15 MED ORDER — ESMOLOL HCL 100 MG/10ML IV SOLN
INTRAVENOUS | Status: DC | PRN
Start: 2022-12-15 — End: 2022-12-15
  Administered 2022-12-15: 10 mg via INTRAVENOUS

## 2022-12-15 NOTE — Interval H&P Note (Signed)
History and Physical Interval Note: Preprocedure H&P from 12/15/22  was reviewed and there was no interval change after seeing and examining the patient.  Written consent was obtained from the patient after discussion of risks, benefits, and alternatives. Patient has consented to proceed with Esophagogastroduodenoscopy and Colonoscopy with possible intervention   12/15/2022 7:30 AM  [name] [name]  has presented today for surgery, with the diagnosis of V76.51 (ICD-9-CM) - Z12.11 (ICD-10-CM) - Colon cancer screening530.81 (ICD-9-CM) - K21.9 (ICD-10-CM) - Gastroesophageal reflux disease, unspecified whether esophagitis present787.20 (ICD-9-CM) - R13.10 (ICD-10-CM) - Dysphagia, unspecified type.  The various methods of treatment have been discussed with the patient and family. After consideration of risks, benefits and other options for treatment, the patient has consented to  Procedure(s): COLONOSCOPY WITH PROPOFOL (N/A) ESOPHAGOGASTRODUODENOSCOPY (EGD) WITH PROPOFOL (N/A) as a surgical intervention.  The patient's history has been reviewed, patient examined, no change in status, stable for surgery.  I have reviewed the patient's chart and labs.  Questions were answered to the patient's satisfaction.     Jaynie Collins

## 2022-12-15 NOTE — Op Note (Signed)
[facility]astroenterology Patient Name: [name] [name] Procedure Date: 12/15/2022 7:31 AM MRN: [id number] Account #: [id number] Date of Birth: November 01, 1970 Admit Type: Outpatient Age: 52 Room: [facility] ENDO ROOM 1 Gender: Female Note Status: Finalized Instrument Name: Colonoscope [id number] Procedure:             Colonoscopy Indications:           Screening for colorectal malignant neoplasm Providers:             Trenda Moots, DO Referring MD:          Miki Kins, FNP Medicines:             Monitored Anesthesia Care Complications:         No immediate complications. Estimated blood loss:                         Minimal. Procedure:             Pre-Anesthesia Assessment:                        - Prior to the procedure, a History and Physical was                         performed, and patient medications and allergies were                         reviewed. The patient is competent. The risks and                         benefits of the procedure and the sedation options and                         risks were discussed with the patient. All questions                         were answered and informed consent was obtained.                         Patient identification and proposed procedure were                         verified by the physician, the nurse, the anesthetist                         and the technician in the endoscopy suite. Mental                         Status Examination: alert and oriented. Airway                         Examination: normal oropharyngeal airway and neck                         mobility. Respiratory Examination: clear to                         auscultation. CV Examination: tachycardia noted.  Prophylactic Antibiotics: The patient does not require                         prophylactic antibiotics. Prior Anticoagulants: The                         patient has taken no anticoagulant or antiplatelet                          agents. ASA Grade Assessment: III - A patient with                         severe systemic disease. After reviewing the risks and                         benefits, the patient was deemed in satisfactory                         condition to undergo the procedure. The anesthesia                         plan was to use monitored anesthesia care (MAC).                         Immediately prior to administration of medications,                         the patient was re-assessed for adequacy to receive                         sedatives. The heart rate, respiratory rate, oxygen                         saturations, blood pressure, adequacy of pulmonary                         ventilation, and response to care were monitored                         throughout the procedure. The physical status of the                         patient was re-assessed after the procedure.                        After obtaining informed consent, the colonoscope was                         passed under direct vision. Throughout the procedure,                         the patient's blood pressure, pulse, and oxygen                         saturations were monitored continuously. The                         Colonoscope was introduced through the anus and  advanced to the the terminal ileum, with                         identification of the appendiceal orifice and IC                         valve. The colonoscopy was performed without                         difficulty. The patient tolerated the procedure well.                         The quality of the bowel preparation was evaluated                         using the BBPS [facility] Bowel Preparation Scale) with                         scores of: Right Colon = 3 (entire mucosa seen well                         with no residual staining, small fragments of stool or                         opaque liquid), Transverse Colon = 3 (entire mucosa                          seen well with no residual staining, small fragments                         of stool or opaque liquid) and Left Colon = 2 (minor                         amount of residual staining, small fragments of stool                         and/or opaque liquid, but mucosa seen well). The total                         BBPS score equals 8. The quality of the bowel                         preparation was excellent. The terminal ileum,                         ileocecal valve, appendiceal orifice, and rectum were                         photographed. Findings:      Skin tags were found on perianal exam.      The perianal and digital rectal examinations were normal. Pertinent       negatives include normal sphincter tone.      The terminal ileum appeared normal. Estimated blood loss: none.      A 1 to 2 mm polyp was found in the cecum. The polyp was sessile. The       polyp was removed with a jumbo cold  forceps. Resection and retrieval       were complete. Estimated blood loss was minimal.      Localized mild inflammation characterized by erythema was found in the       rectum. Biopsies were taken with a cold forceps for histology. Estimated       blood loss was minimal.      Non-bleeding internal hemorrhoids were found during retroflexion. The       hemorrhoids were Grade I (internal hemorrhoids that do not prolapse).       Estimated blood loss: none.      Scattered small-mouthed diverticula were found in the entire colon.       Estimated blood loss: none.      The exam was otherwise without abnormality on direct and retroflexion       views. Impression:            - Perianal skin tags found on perianal exam.                        - The examined portion of the ileum was normal.                        - One 1 to 2 mm polyp in the cecum, removed with a                         jumbo cold forceps. Resected and retrieved.                        - Localized mild inflammation was  found in the rectum.                         Biopsied.                        - Non-bleeding internal hemorrhoids.                        - Diverticulosis in the entire examined colon.                        - The examination was otherwise normal on direct and                         retroflexion views. Recommendation:        - Patient has a contact number available for                         emergencies. The signs and symptoms of potential                         delayed complications were discussed with the patient.                         Return to normal activities tomorrow. Written                         discharge instructions were provided to the patient.                        - Discharge patient  to home.                        - Resume previous diet.                        - Continue present medications.                        - Await pathology results.                        - Repeat colonoscopy for surveillance based on                         pathology results.                        - Return to GI office as previously scheduled.                        - The findings and recommendations were discussed with                         the patient. Procedure Code(s):     --- Professional ---                        [phone], Colonoscopy, flexible; with biopsy, single or                         multiple Diagnosis Code(s):     --- Professional ---                        Z12.11, Encounter for screening for malignant neoplasm                         of colon                        K64.0, First degree hemorrhoids                        D12.0, Benign neoplasm of cecum                        K62.89, Other specified diseases of anus and rectum                        K64.4, Residual hemorrhoidal skin tags                        K57.30, Diverticulosis of large intestine without                         perforation or abscess without bleeding CPT copyright 2022 American Medical Association.  All rights reserved. The codes documented in this report are preliminary and upon coder review may  be revised to meet current compliance requirements. Attending Participation:      I personally performed the entire procedure. Elfredia Nevins, DO Jaynie Collins DO, DO 12/15/2022 8:22:20 AM This report has been signed electronically. Number of Addenda: 0 Note Initiated On: 12/15/2022 7:31 AM Scope  Withdrawal Time: 0 hours 11 minutes 25 seconds  Total Procedure Duration: 0 hours 14 minutes 28 seconds  Estimated Blood Loss:  Estimated blood loss was minimal.      [facility]

## 2022-12-15 NOTE — Anesthesia Preprocedure Evaluation (Signed)
Anesthesia Evaluation  Patient identified by MRN, date of birth, ID band Patient awake    Reviewed: Allergy & Precautions, NPO status , Patient's Chart, lab work & pertinent test results  History of Anesthesia Complications Negative for: history of anesthetic complications  Airway Mallampati: III  TM Distance: >3 FB Neck ROM: full    Dental  (+) Chipped   Pulmonary neg shortness of breath, asthma    Pulmonary exam normal        Cardiovascular Exercise Tolerance: Good hypertension, + dysrhythmias  Rhythm:regular Rate:Tachycardia     Neuro/Psych  Headaches  negative psych ROS   GI/Hepatic negative GI ROS, Neg liver ROS,,,  Endo/Other  negative endocrine ROS    Renal/GU Renal disease  negative genitourinary   Musculoskeletal   Abdominal   Peds  Hematology negative hematology ROS (+)   Anesthesia Other Findings Past Medical History: No date: Asthma 07/09/2014: H/O sleep apnea No date: Headache No date: Hypertension     Comment:  controlled 02/12/2020: Left arm numbness No date: Neurofibromatosis (HCC)     Comment:  since birth; contributes to back pain No date: Renal disorder No date: Scoliosis of thoracic spine     Comment:  "C" curve, convex to left No date: Thyroid disease     Comment:  controlled  Past Surgical History: No date: CESAREAN SECTION No date: chamberlain procedure No date: THYROIDECTOMY  BMI    Body Mass Index: 26.02 kg/m      Reproductive/Obstetrics negative OB ROS                             Anesthesia Physical Anesthesia Plan  ASA: 3  Anesthesia Plan: General   Post-op Pain Management:    Induction: Intravenous  PONV Risk Score and Plan: Propofol infusion and TIVA  Airway Management Planned: Natural Airway and Nasal Cannula  Additional Equipment:   Intra-op Plan:   Post-operative Plan:   Informed Consent: I have reviewed the patients  History and Physical, chart, labs and discussed the procedure including the risks, benefits and alternatives for the proposed anesthesia with the patient or authorized representative who has indicated his/her understanding and acceptance.     Dental Advisory Given  Plan Discussed with: Anesthesiologist, CRNA and Surgeon  Anesthesia Plan Comments: (Patient consented for risks of anesthesia including but not limited to:  - adverse reactions to medications - risk of airway placement if required - damage to eyes, teeth, lips or other oral mucosa - nerve damage due to positioning  - sore throat or hoarseness - Damage to heart, brain, nerves, lungs, other parts of body or loss of life  Patient voiced understanding.)       Anesthesia Quick Evaluation

## 2022-12-15 NOTE — H&P (Addendum)
Pre-Procedure H&P   Patient ID: [name] [name] is a 52 y.o. female.  Gastroenterology Provider: Jaynie Collins, DO  Referring Provider: Tawni Pummel, PA PCP: Miki Kins, FNP  Date: 12/15/2022  HPI [name] [name] is a 52 y.o. female who presents today for Esophagogastroduodenoscopy and Colonoscopy for GERD, dysphagia, colorectal cancer screening .  Daily bowel meant without melena or hematochezia  She has noted dysphagia to pills in the upper esophagus.  Notes reflux.  Occasional breakthrough despite PPI use occasional solid food dysphagia.  No odynophagia  Patient with neurofibromatosis type I, status post partial thyroidectomy, cholecystectomy and C-section  No family history of colon cancer or colon polyps   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   Renal disorder    Scoliosis of thoracic spine    "C" curve, convex to left   Thyroid disease    controlled    Past Surgical History:  Procedure Laterality Date   CESAREAN SECTION     chamberlain procedure     THYROIDECTOMY      Family History No h/o GI disease or malignancy  Review of Systems  Constitutional:  Negative for activity change, appetite change, chills, diaphoresis, fatigue, fever and unexpected weight change.  HENT:  Positive for trouble swallowing. Negative for voice change.   Respiratory:  Negative for shortness of breath and wheezing.   Cardiovascular:  Negative for chest pain, palpitations and leg swelling.  Gastrointestinal:  Negative for abdominal distention, abdominal pain, anal bleeding, blood in stool, constipation, diarrhea, nausea, rectal pain and vomiting.  Musculoskeletal:  Negative for arthralgias and myalgias.  Skin:  Negative for color change and pallor.  Neurological:  Negative for  dizziness, syncope and weakness.  Psychiatric/Behavioral:  Negative for confusion.   All other systems reviewed and are negative.    Medications No current facility-administered medications on file prior to encounter.   Current Outpatient Medications on File Prior to Encounter  Medication Sig Dispense Refill   albuterol (VENTOLIN HFA) 108 (90 Base) MCG/ACT inhaler Inhale 1-2 puffs into the lungs every 6 (six) hours as needed for wheezing or shortness of breath. 18 g 5   amitriptyline (ELAVIL) 50 MG tablet TAKE 1 TABLET BY MOUTH EVERY NIGHT AT BEDTIME 90 tablet 3   metoprolol succinate (TOPROL-XL) 100 MG 24 hr tablet Take 1 tablet (100 mg total) by mouth daily. 90 tablet 3   ondansetron (ZOFRAN-ODT) 8 MG disintegrating tablet DISSOLVE 1 TABLET UNDER THE TONGUE THREE TIMES DAILY AS NEEDED FOR NAUSEA 90 tablet 3   pantoprazole (PROTONIX) 40 MG tablet Take 1 tablet (40 mg total) by mouth daily. 90 tablet 3   acetaminophen (TYLENOL) 500 MG tablet Take 500 mg by mouth every 8 (eight) hours as needed. 3 tabs     Bempedoic Acid-Ezetimibe (NEXLIZET) 180-10 MG TABS Take 1 tablet by mouth daily. (Patient not taking: Reported on 12/15/2022) 180 tablet 1   cyclobenzaprine (FLEXERIL) 10 MG tablet Take 1 tablet (10 mg total) by mouth 3 (three) times daily as needed for muscle spasms. 90 tablet 5   desvenlafaxine (PRISTIQ) 50 MG 24 hr tablet TAKE 1 TABLET BY MOUTH EVERY DAY IN THE MORNING 90 tablet 3   SUMAtriptan (IMITREX) 100 MG tablet TAKE 1 TABLET BY MOUTH EVERY  DAY AS NEEDED FOR MIGRAINE MAY REPEAT IN ONE HOUR IF NOT BETTER 90 tablet 1   Vitamin D, Ergocalciferol, (DRISDOL) 1.25 MG (50000 UNIT) CAPS capsule Take 1 capsule (50,000 Units total) by mouth once a week. 12 capsule 3    Pertinent medications related to GI and procedure were reviewed by me with the patient prior to the procedure   Current Facility-Administered Medications:    0.9 %  sodium chloride infusion, , Intravenous, Continuous, Jaynie Collins, DO, Last Rate: 20 mL/hr at 12/15/22 0720, New Bag at 12/15/22 0720  sodium chloride 20 mL/hr at 12/15/22 0720       Allergies  Allergen Reactions   Peanuts [Peanut Oil] Hives and Other (See Comments)    Mouth ulcers   Penicillins Hives and Rash   Allergies were reviewed by me prior to the procedure  Objective   Body mass index is 26.02 kg/m. Vitals:   12/15/22 0659 12/15/22 0709  BP:  (!) 119/96  Pulse:  (!) 125  Resp:  16  Temp:  (!) 97.4 F (36.3 C)  TempSrc:  Temporal  SpO2:  100%  Weight: 63.5 kg      Physical Exam Vitals and nursing note reviewed.  Constitutional:      General: She is not in acute distress.    Appearance: Normal appearance. She is not ill-appearing, toxic-appearing or diaphoretic.  HENT:     Head: Normocephalic and atraumatic.     Nose: Nose normal.     Mouth/Throat:     Mouth: Mucous membranes are moist.     Pharynx: Oropharynx is clear.  Eyes:     General: No scleral icterus.    Extraocular Movements: Extraocular movements intact.  Cardiovascular:     Rate and Rhythm: Regular rhythm. Tachycardia present.     Heart sounds: Normal heart sounds. No murmur heard.    No friction rub. No gallop.     Comments: Tachycardia improved while patient is here Pulmonary:     Effort: Pulmonary effort is normal. No respiratory distress.     Breath sounds: Normal breath sounds. No wheezing, rhonchi or rales.  Abdominal:     General: Bowel sounds are normal. There is no distension.     Palpations: Abdomen is soft.     Tenderness: There is no abdominal tenderness. There is no guarding or rebound.  Musculoskeletal:     Cervical back: Neck supple.     Right lower leg: No edema.     Left lower leg: No edema.  Skin:    General: Skin is warm and dry.     Coloration: Skin is not jaundiced or pale.  Neurological:     General: No focal deficit present.     Mental Status: She is alert and oriented to person, place, and time. Mental status is  at baseline.  Psychiatric:        Mood and Affect: Mood normal.        Behavior: Behavior normal.        Thought Content: Thought content normal.        Judgment: Judgment normal.      Assessment:  [name] [name] is a 53 y.o. female  who presents today for Esophagogastroduodenoscopy and Colonoscopy for GERD, dysphagia, colorectal cancer screening .  Plan:  Esophagogastroduodenoscopy and Colonoscopy with possible intervention today  Esophagogastroduodenoscopy and Colonoscopy with possible biopsy, control of bleeding, polypectomy, and interventions as necessary has been discussed with the patient/patient representative. Informed consent was obtained  from the patient/patient representative after explaining the indication, nature, and risks of the procedure including but not limited to death, bleeding, perforation, missed neoplasm/lesions, cardiorespiratory compromise, and reaction to medications. Opportunity for questions was given and appropriate answers were provided. Patient/patient representative has verbalized understanding is amenable to undergoing the procedure.   Jaynie Collins, DO  [facility]  Portions of the record may have been created with voice recognition software. Occasional wrong-word or 'sound-a-like' substitutions may have occurred due to the inherent limitations of voice recognition software.  Read the chart carefully and recognize, using context, where substitutions may have occurred.

## 2022-12-15 NOTE — Transfer of Care (Signed)
Immediate Anesthesia Transfer of Care Note  Patient: [name] [name]  Procedure(s) Performed: COLONOSCOPY WITH PROPOFOL ESOPHAGOGASTRODUODENOSCOPY (EGD) WITH PROPOFOL BIOPSY POLYPECTOMY MALONEY DILATION  Patient Location: Endoscopy Unit  Anesthesia Type:General  Level of Consciousness: awake, alert , and drowsy  Airway & Oxygen Therapy: Patient Spontanous Breathing  Post-op Assessment: Report given to RN and Post -op Vital signs reviewed and stable  Post vital signs: Reviewed and stable  Last Vitals:  Vitals Value Taken Time  BP 107/81 12/15/22 0817  Temp 36.3 C 12/15/22 0817  Pulse 99 12/15/22 0819  Resp 23 12/15/22 0819  SpO2 97 % 12/15/22 0819  Vitals shown include unfiled device data.  Last Pain:  Vitals:   12/15/22 0817  TempSrc: Temporal  PainSc: 0-No pain         Complications: No notable events documented.

## 2022-12-15 NOTE — Progress Notes (Signed)
Pulse 125, regular. Patient says she has a history of tachycardia, without A. Fib. Anesth aware

## 2022-12-15 NOTE — Anesthesia Postprocedure Evaluation (Signed)
Anesthesia Post Note  Patient: [name] [name]  Procedure(s) Performed: COLONOSCOPY WITH PROPOFOL ESOPHAGOGASTRODUODENOSCOPY (EGD) WITH PROPOFOL BIOPSY POLYPECTOMY MALONEY DILATION  Patient location during evaluation: Endoscopy Anesthesia Type: General Level of consciousness: awake and alert Pain management: pain level controlled Vital Signs Assessment: post-procedure vital signs reviewed and stable Respiratory status: spontaneous breathing, nonlabored ventilation, respiratory function stable and patient connected to nasal cannula oxygen Cardiovascular status: blood pressure returned to baseline and stable Postop Assessment: no apparent nausea or vomiting Anesthetic complications: no   No notable events documented.   Last Vitals:  Vitals:   12/15/22 0709 12/15/22 0817  BP: (!) 119/96 107/81  Pulse: (!) 125 94  Resp: 16 13  Temp: (!) 36.3 C (!) 36.3 C  SpO2: 100% 98%    Last Pain:  Vitals:   12/15/22 0837  TempSrc:   PainSc: 0-No pain                 Cleda Mccreedy Manu Rubey

## 2022-12-15 NOTE — Op Note (Signed)
[facility] Gastroenterology Patient Name: [name] [name] Procedure Date: 12/15/2022 7:32 AM MRN: [id number] Account #: [id number] Date of Birth: 1970/07/25 Admit Type: Outpatient Age: 52 Room: [facility] ENDO ROOM 1 Gender: Female Note Status: Finalized Instrument Name: Upper Endoscope [id number] Procedure:             Upper GI endoscopy Indications:           Dysphagia, Suspected esophageal reflux Providers:             Trenda Moots, DO Referring MD:          Miki Kins, FNP Medicines:             Monitored Anesthesia Care Complications:         No immediate complications. Estimated blood loss:                         Minimal. Procedure:             Pre-Anesthesia Assessment:                        - Prior to the procedure, a History and Physical was                         performed, and patient medications and allergies were                         reviewed. The patient is competent. The risks and                         benefits of the procedure and the sedation options and                         risks were discussed with the patient. All questions                         were answered and informed consent was obtained.                         Patient identification and proposed procedure were                         verified by the physician, the nurse, the anesthetist                         and the technician in the endoscopy suite. Mental                         Status Examination: alert and oriented. Airway                         Examination: normal oropharyngeal airway and neck                         mobility. Respiratory Examination: clear to                         auscultation. CV Examination: tachycardia noted.  Prophylactic Antibiotics: The patient does not require                         prophylactic antibiotics. Prior Anticoagulants: The                         patient has taken no anticoagulant or antiplatelet                          agents. ASA Grade Assessment: III - A patient with                         severe systemic disease. After reviewing the risks and                         benefits, the patient was deemed in satisfactory                         condition to undergo the procedure. The anesthesia                         plan was to use monitored anesthesia care (MAC).                         Immediately prior to administration of medications,                         the patient was re-assessed for adequacy to receive                         sedatives. The heart rate, respiratory rate, oxygen                         saturations, blood pressure, adequacy of pulmonary                         ventilation, and response to care were monitored                         throughout the procedure. The physical status of the                         patient was re-assessed after the procedure.                        After obtaining informed consent, the endoscope was                         passed under direct vision. Throughout the procedure,                         the patient's blood pressure, pulse, and oxygen                         saturations were monitored continuously. The Endoscope                         was introduced through the mouth, and advanced to the  second part of duodenum. The upper GI endoscopy was                         accomplished without difficulty. The patient tolerated                         the procedure well. Findings:      The duodenal bulb, first portion of the duodenum, second portion of the       duodenum and third portion of the duodenum were normal. Estimated blood       loss: none.      Localized moderate inflammation characterized by erythema and       granularity was found in the gastric body. Biopsies were taken with a       cold forceps for Helicobacter pylori testing. Biopsies were taken with a       cold forceps for histology.  Estimated blood loss was minimal.      The exam of the stomach was otherwise normal.      One tongue of salmon-colored mucosa was present from 34 to 35 cm. No       other visible abnormalities were present. The maximum longitudinal       extent of these esophageal mucosal changes was 2 cm in length. Biopsies       were taken with a cold forceps for histology. Estimated blood loss was       minimal.      Esophagogastric landmarks were identified: the gastroesophageal junction       was found at 34 cm from the incisors.      Abnormal motility was noted in the esophagus. The cricopharyngeus was       normal. There is spasticity of the esophageal body. The distal       esophagus/lower esophageal sphincter is open. Estimated blood loss: none.      A small area of extrinsic compression was found in the middle third of       the esophagus. The scope was withdrawn. Dilation was performed with a       Maloney dilator with no resistance at 48 Fr. The dilation site was       examined following endoscope reinsertion and showed mild mucosal       disruption. Disruption was noted at 15 cm from incisors Estimated blood       loss was minimal.      The exam of the esophagus was otherwise normal.      A few 1 to 2 mm sessile polyps with no bleeding and no stigmata of       recent bleeding were found in the gastric body. The polyp was removed       with a cold biopsy forceps. Resection and retrieval were complete.       Estimated blood loss was minimal. Impression:            - Normal duodenal bulb, first portion of the duodenum,                         second portion of the duodenum and third portion of                         the duodenum.                        -  Gastritis. Biopsied.                        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 Biopsied.                        - Esophagogastric landmarks identified.                         - Abnormal esophageal motility, suspicious for                         esophageal spasm.                        - Extrinsic compression in the middle third of the                         esophagus. Dilated.                        - A few gastric polyps. Resected and retrieved. Recommendation:        - Patient has a contact number available for                         emergencies. The signs and symptoms of potential                         delayed complications were discussed with the patient.                         Return to normal activities tomorrow. Written                         discharge instructions were provided to the patient.                        - Discharge patient to home.                        - Soft diet today.                        - Continue present medications.                        - Use Protonix (pantoprazole) 40 mg PO BID for 8 weeks.                        - Await pathology results.                        - Repeat upper endoscopy for surveillance based on                         pathology results.                        - Return to GI office  as previously scheduled.                        - Recommend CT chest to evaluate extrinsic compression.                        Consider motility and manometry study                        moisten and chew food well. small bites.                        Proceed with colonoscopy. see report for further                         recommendations                        - The findings and recommendations were discussed with                         the patient. Procedure Code(s):     --- Professional ---                        [phone], Esophagogastroduodenoscopy, flexible,                         transoral; with biopsy, single or multiple                        43450, Dilation of esophagus, by unguided sound or                         bougie, single or multiple passes Diagnosis Code(s):     --- Professional ---                         K22.70, Barrett's esophagus without dysplasia                        K29.70, Gastritis, unspecified, without bleeding                        K22.4, Dyskinesia of esophagus                        K22.2, Esophageal obstruction                        K31.7, Polyp of stomach and duodenum                        R13.10, Dysphagia, unspecified CPT copyright 2022 [employer]. All rights reserved. The codes documented in this report are preliminary and upon coder review may  be revised to meet current compliance requirements. Attending Participation:      I personally performed the entire procedure. Elfredia Nevins, DO Jaynie Collins DO, DO 12/15/2022 7:56:35 AM This report has been signed electronically. Number of Addenda: 0 Note Initiated On: 12/15/2022 7:32 AM Estimated Blood Loss:  Estimated blood loss was minimal.      [facility]

## 2022-12-16 ENCOUNTER — Encounter: Payer: Self-pay | Admitting: Gastroenterology

## 2022-12-19 ENCOUNTER — Other Ambulatory Visit: Payer: Self-pay | Admitting: Gastroenterology

## 2022-12-19 DIAGNOSIS — R198 Other specified symptoms and signs involving the digestive system and abdomen: Secondary | ICD-10-CM

## 2022-12-19 NOTE — Group Note (Deleted)

## 2022-12-20 ENCOUNTER — Encounter: Payer: Self-pay | Admitting: Gastroenterology

## 2022-12-22 ENCOUNTER — Encounter: Payer: Self-pay | Admitting: Gastroenterology

## 2022-12-22 ENCOUNTER — Encounter: Payer: Self-pay | Admitting: Student in an Organized Health Care Education/Training Program

## 2022-12-22 ENCOUNTER — Ambulatory Visit
Payer: Medicare HMO | Attending: Student in an Organized Health Care Education/Training Program | Admitting: Student in an Organized Health Care Education/Training Program

## 2022-12-22 VITALS — BP 131/104 | HR 120 | Temp 97.1°F | Resp 18 | Ht 61.5 in | Wt 145.0 lb

## 2022-12-22 DIAGNOSIS — R6889 Other general symptoms and signs: Secondary | ICD-10-CM | POA: Diagnosis not present

## 2022-12-22 DIAGNOSIS — M792 Neuralgia and neuritis, unspecified: Secondary | ICD-10-CM | POA: Insufficient documentation

## 2022-12-22 DIAGNOSIS — G894 Chronic pain syndrome: Secondary | ICD-10-CM | POA: Insufficient documentation

## 2022-12-22 DIAGNOSIS — Q8501 Neurofibromatosis, type 1: Secondary | ICD-10-CM | POA: Diagnosis not present

## 2022-12-22 MED ORDER — HYDROCODONE-ACETAMINOPHEN 7.5-325 MG PO TABS: 1.0000 | ORAL_TABLET | Freq: Every day | ORAL | 0 refills | Status: DC | PRN

## 2022-12-22 NOTE — Progress Notes (Signed)
PROVIDER NOTE: Information contained herein reflects review and annotations entered in association with encounter. Interpretation of such information and data should be left to medically-trained personnel. Information provided to patient can be located elsewhere in the medical record under "Patient Instructions". Document created using STT-dictation technology, any transcriptional errors that may result from process are unintentional.    Patient: [name] [name]  Service Category: E/M  Provider: Edward Jolly, MD  DOB: 01/06/1971  DOS: 12/22/2022  Specialty: Interventional Pain Management  MRN: [id number]  Setting: Ambulatory outpatient  PCP: Miki Kins, FNP  Type: Established Patient    Referring Provider: Miki Kins, FNP  Location: Office  Delivery: Face-to-face     HPI  [name], a 51 y.o. year old female, is here today because of her Chronic pain syndrome [G89.4]. Ms. Tursi's primary complain today is Back Pain  Last encounter: My last encounter with her was on 09/27/22  Pertinent problems: Ms. Daily has Back pain; Neurofibromatosis (HCC); Idiopathic scoliosis; Chest wall pain, chronic; Thoracic spine pain; Neurofibroma; Neuropathic pain; Neurofibromatosis, type 1 (HCC); and Chronic pain syndrome on their pertinent problem list. Pain Assessment: Severity of Chronic pain is reported as a 7 /10. Location: Back Medial, Mid, Right, Left/Radaites from mid back radiates from left back into chest area. Onset: More than a month ago. Quality: Constant, Sharp ("pulsating"). Timing: Constant. Modifying factor(s): heat and rest. Vitals:  height is 5' 1.5" (1.562 m) and weight is 145 lb (65.8 kg). Her temporal temperature is 97.1 F (36.2 C) (abnormal). Her blood pressure is 131/104 (abnormal) and her pulse is 120 (abnormal). Her respiration is 18 and oxygen saturation is 100%.   Reason for encounter: medication management.   Patient continues multimodal pain regimen as prescribed.   States that it provides pain relief and improvement in functional status. No falls or visits to urgent care or emergency department Has an upcoming CT scan of her chest given concern for airway collapse when she is sleeping.  She notes shortness of breath and difficulty breathing when she is lying supine.     Pharmacotherapy Assessment  Analgesic: Hydrocodone 7.5 mg twice daily as needed, quantity 45/month; MME equals 15    Monitoring: [place] PMP: PDMP reviewed during this encounter.       Pharmacotherapy: No side-effects or adverse reactions reported. Compliance: No problems identified. Effectiveness: Clinically acceptable.  UDS:  Summary  Date Value Ref Range Status  12/16/2021 Note  Final    Comment:    ==================================================================== [employer] Select 13 (MW) ==================================================================== Test                             Result       Flag       Units  Drug Present and Declared for Prescription Verification   Hydrocodone                    984          EXPECTED   ng/mg creat   Hydromorphone                  205          EXPECTED   ng/mg creat   Dihydrocodeine                 80           EXPECTED   ng/mg creat   Norhydrocodone  1772         EXPECTED   ng/mg creat    Sources of hydrocodone include scheduled prescription medications.    Hydromorphone, dihydrocodeine and norhydrocodone are expected    metabolites of hydrocodone. Hydromorphone and dihydrocodeine are    also available as scheduled prescription medications.  ==================================================================== Test                      Result    Flag   Units      Ref Range   Creatinine              169              mg/dL      >=16 ==================================================================== Declared Medications:  The flagging and interpretation on this report are based on the  following declared  medications.  Unexpected results may arise from  inaccuracies in the declared medications.   **Note: The testing scope of this panel includes these medications:   Hydrocodone (Norco)   **Note: The testing scope of this panel does not include the  following reported medications:   Acetaminophen (Tylenol)  Acetaminophen (Norco)  Albuterol (Ventolin HFA)  Cyclobenzaprine (Flexeril)  Desvenlafaxine (Pristiq)  Gabapentin (Neurontin)  Ketorolac (Toradol)  Metoprolol (Toprol)  Nortriptyline (Pamelor)  Ondansetron (Zofran)  Oxybutynin (Ditropan)  Pantoprazole (Protonix)  Phenazopyridine (Pyridium)  Sumatriptan (Imitrex)  Tamsulosin (Flomax)  Vitamin D2 ==================================================================== For clinical consultation, please call [phone]. ====================================================================       ROS  Constitutional: Denies any fever or chills Gastrointestinal: No reported hemesis, hematochezia, vomiting, or acute GI distress Musculoskeletal:  Low back, bilateral hip pain Neurological: No reported episodes of acute onset apraxia, aphasia, dysarthria, agnosia, amnesia, paralysis, loss of coordination, or loss of consciousness  Medication Review  Bempedoic Acid-Ezetimibe, HYDROcodone-acetaminophen, SUMAtriptan, Vitamin D (Ergocalciferol), acetaminophen, albuterol, amitriptyline, cyclobenzaprine, desvenlafaxine, gabapentin, metoprolol succinate, ondansetron, and pantoprazole  History Review  Allergy: Ms. Persley is allergic to peanuts [peanut oil] and penicillins. Drug: Ms. Gera  reports no history of drug use. Alcohol:  reports no history of alcohol use. Tobacco:  reports that she has never smoked. She has never used smokeless tobacco. Social: Ms. Harri  reports that she has never smoked. She has never used smokeless tobacco. She reports that she does not drink alcohol and does not use drugs. Medical:  has a past medical  history of Asthma, H/O sleep apnea (07/09/2014), Headache, Hypertension, Left arm numbness (02/12/2020), Neurofibromatosis (HCC), Renal disorder, Scoliosis of thoracic spine, and Thyroid disease. Surgical: Ms. Mccommons  has a past surgical history that includes Cesarean section; chamberlain procedure; Thyroidectomy; Colonoscopy with propofol (N/A, 12/15/2022); Esophagogastroduodenoscopy (egd) with propofol (N/A, 12/15/2022); biopsy (12/15/2022); polypectomy (12/15/2022); and maloney dilation (12/15/2022). Family: family history includes Cancer in her mother.  Laboratory Chemistry Profile   Renal Lab Results  Component Value Date   BUN 10 05/20/2022   CREATININE 0.65 05/20/2022   BCR 15 05/20/2022   GFRAA >60 05/24/2018   GFRNONAA >60 01/16/2022     Hepatic Lab Results  Component Value Date   AST 13 05/20/2022   ALT 10 05/20/2022   ALBUMIN 4.6 05/20/2022   ALKPHOS 137 (H) 05/20/2022   LIPASE 28 01/16/2022     Electrolytes Lab Results  Component Value Date   NA 141 05/20/2022   K 4.1 05/20/2022   CL 101 05/20/2022   CALCIUM 9.6 05/20/2022     Bone No results found for: "VD25OH", "VD125OH2TOT", "WJ1914NW2", "NF6213YQ6", "25OHVITD1", "25OHVITD2", "25OHVITD3", "TESTOFREE", "TESTOSTERONE"  Inflammation (CRP: Acute Phase) (ESR: Chronic Phase) No results found for: "CRP", "ESRSEDRATE", "LATICACIDVEN"     Note: Above Lab results reviewed.  Recent Imaging Review  MM 3D SCREENING MAMMOGRAM BILATERAL BREAST CLINICAL DATA:  Screening.  EXAM: DIGITAL SCREENING BILATERAL MAMMOGRAM WITH TOMOSYNTHESIS AND CAD  TECHNIQUE: Bilateral screening digital craniocaudal and mediolateral oblique mammograms were obtained. Bilateral screening digital breast tomosynthesis was performed. The images were evaluated with computer-aided detection.  COMPARISON:  Previous exam(s).  ACR Breast Density Category b: There are scattered areas of fibroglandular density.  FINDINGS: There are no findings  suspicious for malignancy.  IMPRESSION: No mammographic evidence of malignancy. A result letter of this screening mammogram will be mailed directly to the patient.  RECOMMENDATION: Screening mammogram in one year. (Code:SM-B-01Y)  BI-RADS CATEGORY  1: Negative.  Electronically Signed   By: Edwin Cap M.D.   On: 08/17/2022 11:50  Note: Reviewed        Physical Exam  General appearance: Well nourished, well developed, and well hydrated. In no apparent acute distress Mental status: Alert, oriented x 3 (person, place, & time)       Respiratory: No evidence of acute respiratory distress Eyes: PERLA Vitals: BP (!) 131/104   Pulse (!) 120   Temp (!) 97.1 F (36.2 C) (Temporal)   Resp 18   Ht 5' 1.5" (1.562 m)   Wt 145 lb (65.8 kg)   SpO2 100%   BMI 26.95 kg/m  BMI: Estimated body mass index is 26.95 kg/m as calculated from the following:   Height as of this encounter: 5' 1.5" (1.562 m).   Weight as of this encounter: 145 lb (65.8 kg). Ideal: Ideal body weight: 48.9 kg (107 lb 14.6 oz) Adjusted ideal body weight: 55.7 kg (122 lb 12 oz)  +low back pain, bilateral hip pain  5 out of 5 strength bilateral lower extremity: Plantar flexion, dorsiflexion, knee flexion, knee extension.   Assessment   Status Diagnosis  Controlled Controlled Controlled 1. Chronic pain syndrome   2. Neurofibromatosis, type 1 (von Recklinghausen's disease) (HCC)   3. Neuropathic pain        Plan of Care   Ms. Jenette L Spellman has a current medication list which includes the following long-term medication(s): albuterol, amitriptyline, desvenlafaxine, gabapentin, metoprolol succinate, pantoprazole, sumatriptan, [START ON 01/04/2023] hydrocodone-acetaminophen, [START ON 02/03/2023] hydrocodone-acetaminophen, and [START ON 03/05/2023] hydrocodone-acetaminophen.  Pharmacotherapy (Medications Ordered): Meds ordered this encounter  Medications   HYDROcodone-acetaminophen (NORCO) 7.5-325 MG  tablet    Sig: Take 1-2 tablets by mouth daily as needed for severe pain. Must last 30 days.    Dispense:  45 tablet    Refill:  0    Chronic Pain. (STOP Act - Not applicable). Fill one day early if closed on scheduled refill date.   HYDROcodone-acetaminophen (NORCO) 7.5-325 MG tablet    Sig: Take 1-2 tablets by mouth daily as needed for severe pain. Must last 30 days.    Dispense:  45 tablet    Refill:  0    Chronic Pain. (STOP Act - Not applicable). Fill one day early if closed on scheduled refill date.   HYDROcodone-acetaminophen (NORCO) 7.5-325 MG tablet    Sig: Take 1-2 tablets by mouth daily as needed for severe pain. Must last 30 days.    Dispense:  45 tablet    Refill:  0    Chronic Pain. (STOP Act - Not applicable). Fill one day early if closed on scheduled refill date.  Continue with multimodal analgesics as  managed by PCP.  Including amitriptyline, Flexeril, gabapentin. Orders Placed This Encounter  Procedures   Tox[employer]SU[employer] Select 13 (MW), Urine    Volume: 30 ml(s). Minimum 3 ml of urine is needed. Document temperature of fresh sample. Indications: Long term (current) use of opiate analgesic (Z61.096)    Order Specific Question:   Release to patient    Answer:   Immediate     Follow-up plan:   Return in about 14 weeks (around 03/30/2023) for MM, F2F.   Recent Visits Date Type Provider Dept  09/27/22 Office Visit Edward Jolly, MD [facility]  Showing recent visits within past 90 days and meeting all other requirements Today's Visits Date Type Provider Dept  12/22/22 Office Visit Edward Jolly, MD [facility]  Showing today's visits and meeting all other requirements Future Appointments No visits were found meeting these conditions. Showing future appointments within next 90 days and meeting all other requirements  I discussed the assessment and treatment plan with the patient. The patient was provided an opportunity to ask questions and all were  answered. The patient agreed with the plan and demonstrated an understanding of the instructions.  Patient advised to call back or seek an in-person evaluation if the symptoms or condition worsens.  Duration of encounter:30 minutes.  Note by: Edward Jolly, MD Date: 12/22/2022; Time: 11:34 AM

## 2022-12-22 NOTE — Progress Notes (Signed)
Nursing Pain Medication Assessment:  Safety precautions to be maintained throughout the outpatient stay will include: orient to surroundings, keep bed in low position, maintain call bell within reach at all times, provide assistance with transfer out of bed and ambulation.  Medication Inspection Compliance: Pill count conducted under aseptic conditions, in front of the patient. Neither the pills nor the bottle was removed from the patient's sight at any time. Once count was completed pills were immediately returned to the patient in their original bottle.  Medication: Hydrocodone/APAP Pill/Patch Count:  16 of 45 pills remain Pill/Patch Appearance: Markings consistent with prescribed medication Bottle Appearance: Standard pharmacy container. Clearly labeled. Filled Date: 08 / 25 / 2024 Last Medication intake:  Today

## 2022-12-23 ENCOUNTER — Inpatient Hospital Stay: Admission: RE | Admit: 2022-12-23 | Payer: Medicare HMO | Source: Ambulatory Visit

## 2022-12-29 ENCOUNTER — Ambulatory Visit
Admission: RE | Admit: 2022-12-29 | Discharge: 2022-12-29 | Disposition: A | Discharge status: Home / Self Care | Payer: Medicare HMO | Source: Ambulatory Visit | Attending: Gastroenterology

## 2022-12-29 DIAGNOSIS — M4184 Other forms of scoliosis, thoracic region: Secondary | ICD-10-CM | POA: Diagnosis not present

## 2022-12-29 DIAGNOSIS — E042 Nontoxic multinodular goiter: Secondary | ICD-10-CM | POA: Diagnosis not present

## 2022-12-29 DIAGNOSIS — R6889 Other general symptoms and signs: Secondary | ICD-10-CM | POA: Diagnosis not present

## 2022-12-29 DIAGNOSIS — M47814 Spondylosis without myelopathy or radiculopathy, thoracic region: Secondary | ICD-10-CM | POA: Diagnosis not present

## 2022-12-29 DIAGNOSIS — R198 Other specified symptoms and signs involving the digestive system and abdomen: Secondary | ICD-10-CM

## 2022-12-29 DIAGNOSIS — Q8500 Neurofibromatosis, unspecified: Secondary | ICD-10-CM | POA: Diagnosis not present

## 2022-12-29 DIAGNOSIS — Z9089 Acquired absence of other organs: Secondary | ICD-10-CM | POA: Diagnosis not present

## 2022-12-29 DIAGNOSIS — I700 Atherosclerosis of aorta: Secondary | ICD-10-CM | POA: Diagnosis not present

## 2022-12-29 LAB — TOXASSURE SELECT 13 (MW), URINE

## 2022-12-29 MED ORDER — IOPAMIDOL (ISOVUE-300) INJECTION 61%
75.0000 mL | Freq: Once | INTRAVENOUS | Status: AC | PRN
  Administered 2022-12-29: 75 mL via INTRAVENOUS

## 2023-01-31 ENCOUNTER — Other Ambulatory Visit: Payer: Self-pay

## 2023-02-02 ENCOUNTER — Telehealth: Payer: Self-pay | Admitting: Student in an Organized Health Care Education/Training Program

## 2023-02-02 ENCOUNTER — Other Ambulatory Visit: Payer: Self-pay

## 2023-02-02 DIAGNOSIS — G894 Chronic pain syndrome: Secondary | ICD-10-CM

## 2023-02-02 DIAGNOSIS — Q8501 Neurofibromatosis, type 1: Secondary | ICD-10-CM

## 2023-02-02 DIAGNOSIS — M546 Pain in thoracic spine: Secondary | ICD-10-CM

## 2023-02-02 MED ORDER — CYCLOBENZAPRINE HCL 10 MG PO TABS: 10.0000 mg | ORAL_TABLET | Freq: Three times a day (TID) | ORAL | 1 refills | Status: DC | PRN

## 2023-02-02 NOTE — Telephone Encounter (Signed)
Patient called [facility] she needs script for cyclobenzaprine. Please advise patient

## 2023-02-02 NOTE — Telephone Encounter (Signed)
Cyclobenzaprine was ordered by PCP.  Instructed patient to call PCP.

## 2023-03-06 DIAGNOSIS — Q8501 Neurofibromatosis, type 1: Secondary | ICD-10-CM | POA: Diagnosis not present

## 2023-03-06 DIAGNOSIS — Z789 Other specified health status: Secondary | ICD-10-CM | POA: Diagnosis not present

## 2023-03-06 DIAGNOSIS — R222 Localized swelling, mass and lump, trunk: Secondary | ICD-10-CM | POA: Diagnosis not present

## 2023-03-06 DIAGNOSIS — R6889 Other general symptoms and signs: Secondary | ICD-10-CM | POA: Diagnosis not present

## 2023-03-07 DIAGNOSIS — Q8501 Neurofibromatosis, type 1: Secondary | ICD-10-CM | POA: Diagnosis not present

## 2023-03-07 DIAGNOSIS — Z8669 Personal history of other diseases of the nervous system and sense organs: Secondary | ICD-10-CM | POA: Diagnosis not present

## 2023-03-07 DIAGNOSIS — Z789 Other specified health status: Secondary | ICD-10-CM | POA: Diagnosis not present

## 2023-03-07 DIAGNOSIS — R222 Localized swelling, mass and lump, trunk: Secondary | ICD-10-CM | POA: Diagnosis not present

## 2023-03-09 ENCOUNTER — Encounter: Payer: Self-pay | Admitting: Student in an Organized Health Care Education/Training Program

## 2023-03-27 ENCOUNTER — Other Ambulatory Visit: Payer: Self-pay | Admitting: Student in an Organized Health Care Education/Training Program

## 2023-03-27 DIAGNOSIS — Q8501 Neurofibromatosis, type 1: Secondary | ICD-10-CM

## 2023-03-28 ENCOUNTER — Ambulatory Visit
Payer: Medicare HMO | Attending: Student in an Organized Health Care Education/Training Program | Admitting: Student in an Organized Health Care Education/Training Program

## 2023-03-28 ENCOUNTER — Encounter: Payer: Self-pay | Admitting: Student in an Organized Health Care Education/Training Program

## 2023-03-28 VITALS — BP 124/99 | HR 114 | Temp 97.2°F | Resp 16 | Ht 61.5 in | Wt 145.0 lb

## 2023-03-28 DIAGNOSIS — M792 Neuralgia and neuritis, unspecified: Secondary | ICD-10-CM | POA: Diagnosis not present

## 2023-03-28 DIAGNOSIS — G894 Chronic pain syndrome: Secondary | ICD-10-CM | POA: Diagnosis not present

## 2023-03-28 DIAGNOSIS — Q8501 Neurofibromatosis, type 1: Secondary | ICD-10-CM | POA: Diagnosis not present

## 2023-03-28 DIAGNOSIS — M545 Low back pain, unspecified: Secondary | ICD-10-CM | POA: Insufficient documentation

## 2023-03-28 DIAGNOSIS — R6889 Other general symptoms and signs: Secondary | ICD-10-CM | POA: Diagnosis not present

## 2023-03-28 DIAGNOSIS — M4127 Other idiopathic scoliosis, lumbosacral region: Secondary | ICD-10-CM | POA: Insufficient documentation

## 2023-03-28 DIAGNOSIS — M546 Pain in thoracic spine: Secondary | ICD-10-CM | POA: Diagnosis not present

## 2023-03-28 MED ORDER — HYDROCODONE-ACETAMINOPHEN 10-325 MG PO TABS: 1.0000 | ORAL_TABLET | Freq: Three times a day (TID) | ORAL | 0 refills | Status: DC | PRN

## 2023-03-28 NOTE — Progress Notes (Signed)
Nursing Pain Medication Assessment:  Safety precautions to be maintained throughout the outpatient stay will include: orient to surroundings, keep bed in low position, maintain call bell within reach at all times, provide assistance with transfer out of bed and ambulation.  Medication Inspection Compliance: Pill count conducted under aseptic conditions, in front of the patient. Neither the pills nor the bottle was removed from the patient's sight at any time. Once count was completed pills were immediately returned to the patient in their original bottle.  Medication: Hydrocodone/APAP Pill/Patch Count:  10 of 45 pills remain Pill/Patch Appearance: Markings consistent with prescribed medication Bottle Appearance: Standard pharmacy container. Clearly labeled. Filled Date: 76 / 25 / 2024 Last Medication intake:  Yesterday

## 2023-03-28 NOTE — Progress Notes (Signed)
PROVIDER NOTE: Information contained herein reflects review and annotations entered in association with encounter. Interpretation of such information and data should be left to medically-trained personnel. Information provided to patient can be located elsewhere in the medical record under "Patient Instructions". Document created using STT-dictation technology, any transcriptional errors that may result from process are unintentional.    Patient: [name] [name]  Service Category: E/M  Provider: Edward Jolly, MD  DOB: 1970-05-12  DOS: 03/28/2023  Referring Provider: Miki Kins, FNP  MRN: [id number]  Specialty: Interventional Pain Management  PCP: Miki Kins, FNP  Type: Established Patient  Setting: Ambulatory outpatient    Location: Office  Delivery: Face-to-face     HPI  [name], a 52 y.o. 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 [name]'s primary complain today is Back Pain and Hip Pain (right)  Pertinent problems: [name] has Back pain; Neurofibromatosis (HCC); Idiopathic scoliosis; Chest wall pain, chronic; Thoracic spine pain; Neurofibroma; Neuropathic pain; Neurofibromatosis, type 1 (HCC); and Chronic pain syndrome on their pertinent problem list. Pain Assessment: Severity of Chronic pain is reported as a 8 /10. Location: Back Mid, Upper/neck, mid back, left armpit. Onset: More than a month ago. Quality: Burning, Sharp, Aching. Timing: Constant. Modifying factor(s): meds. Vitals:  height is 5' 1.5" (1.562 m) and weight is 145 lb (65.8 kg). Her temperature is 97.2 F (36.2 C) (abnormal). Her blood pressure is 124/99 (abnormal) and her pulse is 114 (abnormal). Her respiration is 16 and oxygen saturation is 100%.  BMI: Estimated body mass index is 26.95 kg/m as calculated from the following:   Height as of this encounter: 5' 1.5" (1.562 m).   Weight as of this encounter: 145 lb (65.8  kg). Last encounter: 12/22/2022. Last procedure: Visit date not found.  Reason for encounter: medication management.  Discussed the use of AI scribe software for clinical note transcription with the patient, who gave verbal consent to proceed.  History of Present Illness   The patient, with a history of chronic back pain secondary to scoliosis, NF-T1, reports a recent increase in pain severity. She notes that her current medication regimen is no longer providing adequate relief. The patient has undergone recent imaging studies, including MRI and CT scans, which have shown a significant worsening of her back condition.  The patient is currently on a regimen of pain medication, which she has been stable on for a significant period. However, she now reports that the medication is not lasting as long as it used to. She has been taking the medication once a day, but with the increase in pain, she has been considering taking it more frequently.  The patient also reports experiencing constipation, which she manages with a stool softener, and nausea, for which she takes Zofran. She has an upcoming appointment with a neurosurgeon to discuss potential surgical intervention for her worsening back condition.       Pharmacotherapy Assessment  Analgesic: Hydrocodone 7.5 mg twice daily as needed, quantity 45/month--> 10 mg TID PRN; MME equals 15-->30    Monitoring: [place] PMP: PDMP not reviewed this encounter.       Pharmacotherapy: No side-effects or adverse reactions reported. Compliance: No problems identified. Effectiveness: Clinically acceptable.  Nonah Mattes, RN  03/28/2023 10:45 AM  Sign when Signing Visit Nursing Pain Medication Assessment:  Safety precautions to be maintained throughout the outpatient stay will include: orient to surroundings, keep bed in low position, maintain  call bell within reach at all times, provide assistance with transfer out of bed and ambulation.  Medication Inspection  Compliance: Pill count conducted under aseptic conditions, in front of the patient. Neither the pills nor the bottle was removed from the patient's sight at any time. Once count was completed pills were immediately returned to the patient in their original bottle.  Medication: Hydrocodone/APAP Pill/Patch Count:  10 of 45 pills remain Pill/Patch Appearance: Markings consistent with prescribed medication Bottle Appearance: Standard pharmacy container. Clearly labeled. Filled Date: 32 / 25 / 2024 Last Medication intake:  Yesterday  No results found for: "CBDTHCR" No results found for: "D8THCCBX" No results found for: "D9THCCBX"  UDS:  Summary  Date Value Ref Range Status  12/22/2022 Note  Final    Comment:    ==================================================================== [employer] Select 13 (MW) ==================================================================== Test                             Result       Flag       Units  Drug Present and Declared for Prescription Verification   Hydrocodone                    113          EXPECTED   ng/mg creat   Norhydrocodone                 207          EXPECTED   ng/mg creat    Sources of hydrocodone include scheduled prescription medications.    Norhydrocodone is an expected metabolite of hydrocodone.  ==================================================================== Test                      Result    Flag   Units      Ref Range   Creatinine              46               mg/dL      >=65 ==================================================================== Declared Medications:  The flagging and interpretation on this report are based on the  following declared medications.  Unexpected results may arise from  inaccuracies in the declared medications.   **Note: The testing scope of this panel includes these medications:   Hydrocodone (Norco)   **Note: The testing scope of this panel does not include the  following reported  medications:   Acetaminophen (Tylenol)  Acetaminophen (Norco)  Albuterol (Ventolin HFA)  Amitriptyline (Elavil)  Bempedoic acid (Nexlizet)  Cyclobenzaprine (Flexeril)  Desvenlafaxine (Pristiq)  Ezetimibe (Nexlizet)  Gabapentin (Neurontin)  Metoprolol (Toprol)  Ondansetron (Zofran)  Pantoprazole (Protonix)  Sumatriptan (Imitrex)  Vitamin D2 (Drisdol) ==================================================================== For clinical consultation, please call [phone]. ====================================================================       ROS  Constitutional: Denies any fever or chills Gastrointestinal: No reported hemesis, hematochezia, vomiting, or acute GI distress Musculoskeletal: Denies any acute onset joint swelling, redness, loss of ROM, or weakness Neurological:  as above  Medication Review  Bempedoic Acid-Ezetimibe, HYDROcodone-acetaminophen, SUMAtriptan, Vitamin D (Ergocalciferol), acetaminophen, albuterol, amitriptyline, cyclobenzaprine, desvenlafaxine, gabapentin, metoprolol succinate, ondansetron, and pantoprazole  History Review  Allergy: [name] is allergic to peanuts [peanut oil] and penicillins. Drug: [name]  reports no history of drug use. Alcohol:  reports no history of alcohol use. Tobacco:  reports that she has never smoked. She has never used smokeless tobacco. Social: [name]  reports that she  has never smoked. She has never used smokeless tobacco. She reports that she does not drink alcohol and does not use drugs. Medical:  has a past medical history of Asthma, H/O sleep apnea (07/09/2014), Headache, Hypertension, Left arm numbness (02/12/2020), Neurofibromatosis (HCC), Renal disorder, Scoliosis of thoracic spine, and Thyroid disease. Surgical: [name]  has a past surgical history that includes Cesarean section; chamberlain procedure; Thyroidectomy; Colonoscopy with propofol (N/A, 12/15/2022); Esophagogastroduodenoscopy (egd) with  propofol (N/A, 12/15/2022); biopsy (12/15/2022); polypectomy (12/15/2022); and maloney dilation (12/15/2022). Family: family history includes Cancer in her mother.  Laboratory Chemistry Profile   Renal Lab Results  Component Value Date   BUN 10 05/20/2022   CREATININE 0.65 05/20/2022   BCR 15 05/20/2022   GFRAA >60 05/24/2018   GFRNONAA >60 01/16/2022    Hepatic Lab Results  Component Value Date   AST 13 05/20/2022   ALT 10 05/20/2022   ALBUMIN 4.6 05/20/2022   ALKPHOS 137 (H) 05/20/2022   LIPASE 28 01/16/2022    Electrolytes Lab Results  Component Value Date   NA 141 05/20/2022   K 4.1 05/20/2022   CL 101 05/20/2022   CALCIUM 9.6 05/20/2022    Bone No results found for: "VD25OH", "VD125OH2TOT", "WU9811BJ4", "NW2956OZ3", "25OHVITD1", "25OHVITD2", "25OHVITD3", "TESTOFREE", "TESTOSTERONE"  Inflammation (CRP: Acute Phase) (ESR: Chronic Phase) No results found for: "CRP", "ESRSEDRATE", "LATICACIDVEN"       Note: Above Lab results reviewed.  Recent Imaging Review  CT CHEST W CONTRAST CLINICAL DATA:  Neurofibromatosis. Abnormal upper endoscopy is concern for esophageal obstruction.  EXAM: CT CHEST WITH CONTRAST  TECHNIQUE: Multidetector CT imaging of the chest was performed during intravenous contrast administration.  RADIATION DOSE REDUCTION: This exam was performed according to the departmental dose-optimization program which includes automated exposure control, adjustment of the mA and/or kV according to patient size and/or use of iterative reconstruction technique.  CONTRAST:  75mL ISOVUE-300 IOPAMIDOL (ISOVUE-300) INJECTION 61%  COMPARISON:  05/17/2009 chest CT.  04/16/2019 chest radiograph.  FINDINGS: Cardiovascular: Normal heart size. No significant pericardial effusion/thickening. Mildly atherosclerotic nonaneurysmal thoracic aorta. Normal caliber pulmonary arteries. No central pulmonary emboli.  Mediastinum/Nodes: Status post right hemithyroidectomy.  Several hypodense left thyroid nodules, largest 1.3 cm. There are multiple similar low-attenuation paraspinal masses in the bilateral posterior upper mediastinum, which are chronic and increased in size since 05/16/2009 chest CT. Left anterior T1-2 low-attenuation paraspinal 3.4 x 3.0 x 5.0 cm mass with associated asymmetric left T1-2 neural foraminal widening (series 2/image 25), previously 2.6 x 1.8 x 2.9 cm, with extrinsic mass-effect on the left posterior upper esophagus. Similar left T5-6 low-attenuation paraspinal 3.6 x 2.8 x 3.4 cm mass (series 2/image 23), previously 2.7 x 2.2 x 1.8 cm. Similar right T3-4 low-attenuation paraspinal 2.8 x 2.2 x 2.3 cm mass (series 2/image 19), previously 2.1 x 1.6 x 2.0 cm. No pathologically enlarged axillary, mediastinal or hilar lymph nodes.  Lungs/Pleura: No pneumothorax. No pleural effusion. No acute consolidative airspace disease, lung masses or significant pulmonary nodules.  Upper abdomen: Cholecystectomy.  Musculoskeletal: No aggressive appearing focal osseous lesions. Exaggerated upper thoracic kyphosis. Prominent upper thoracic S-shaped thoracic scoliosis. Mild-to-moderate upper thoracic spondylosis.  IMPRESSION: 1. Multiple similar low-attenuation paraspinal masses in the bilateral posterior upper mediastinum, which are chronic and increased in size since 05/16/2009 chest CT, the largest on the left anteriorly at T1-2 measuring 3.4 x 3.0 x 5.0 cm and demonstrating extrinsic mass-effect on the left posterior upper thoracic esophagus. 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 2. Prominent upper thoracic S-shaped thoracic  scoliosis. Exaggerated upper thoracic kyphosis. Mild-to-moderate upper thoracic spondylosis. 3. Right hemithyroidectomy. Several hypodense left thyroid nodules, largest 1.3 cm. Thyroid ultrasound correlation may be obtained as clinically warranted. 4.  Aortic Atherosclerosis  (ICD10-I70.0).  Electronically Signed   By: Delbert Phenix M.D.   On: 01/12/2023 11:58 Note: Reviewed        Physical Exam  General appearance: Well nourished, well developed, and well hydrated. In no apparent acute distress Mental status: Alert, oriented x 3 (person, place, & time)       Respiratory: No evidence of acute respiratory distress Eyes: PERLA Vitals: BP (!) 124/99   Pulse (!) 114   Temp (!) 97.2 F (36.2 C)   Resp 16   Ht 5' 1.5" (1.562 m)   Wt 145 lb (65.8 kg)   SpO2 100%   BMI 26.95 kg/m  BMI: Estimated body mass index is 26.95 kg/m as calculated from the following:   Height as of this encounter: 5' 1.5" (1.562 m).   Weight as of this encounter: 145 lb (65.8 kg). Ideal: Ideal body weight: 48.9 kg (107 lb 14.6 oz) Adjusted ideal body weight: 55.7 kg (122 lb 12 oz)  Assessment   Diagnosis Status  1. Neurofibromatosis, type 1 (von Recklinghausen's disease) (HCC)   2. Neuropathic pain   3. Back pain of thoracolumbar region   4. Other idiopathic scoliosis, lumbosacral region   5. Chronic pain syndrome    Deteriorating Having a Flare-up Having a Flare-up   Updated Problems: No problems updated.  Plan of Care  Problem-specific:  Assessment and Plan    Chronic Pain due to Scoliosis  and NF-T1 Her current medication regimen for chronic pain, exacerbated by scoliosis progression, is inadequate. She has an upcoming neurosurgery appointment to explore surgical options. She experiences occasional constipation and nausea from her medication, managed with a stool softener and ondansetron. She has given informed consent for an increased dosage, understanding the potential benefits and risks, and prefers to continue pain management through our clinic. We will increase the medication dose to 10 mg every 8 hours as needed, allowing her to take 1.5 tablets of the current 7.5 mg dose to approximate the new dosage. The next medication refill is scheduled for March 31, 2023.  She should follow up with our clinic if neurosurgery recommends surgery.       [name] [name] has a current medication list which includes the following long-term medication(s): albuterol, amitriptyline, desvenlafaxine, gabapentin, [START ON 03/31/2023] hydrocodone-acetaminophen, [START ON 04/30/2023] hydrocodone-acetaminophen, [START ON 05/30/2023] hydrocodone-acetaminophen, metoprolol succinate, pantoprazole, and sumatriptan.  Pharmacotherapy (Medications Ordered): Meds ordered this encounter  Medications   HYDROcodone-acetaminophen (NORCO) 10-325 MG tablet    Sig: Take 1 tablet by mouth every 8 (eight) hours as needed for severe pain (pain score 7-10). Must last 30 days.    Dispense:  90 tablet    Refill:  0    Chronic Pain: STOP Act (Not applicable) Fill 1 day early if closed on refill date. Avoid benzodiazepines within 8 hours of opioids   HYDROcodone-acetaminophen (NORCO) 10-325 MG tablet    Sig: Take 1 tablet by mouth every 8 (eight) hours as needed for severe pain (pain score 7-10). Must last 30 days.    Dispense:  90 tablet    Refill:  0    Chronic Pain: STOP Act (Not applicable) Fill 1 day early if closed on refill date. Avoid benzodiazepines within 8 hours of opioids   HYDROcodone-acetaminophen (NORCO) 10-325 MG tablet    Sig:  Take 1 tablet by mouth every 8 (eight) hours as needed for severe pain (pain score 7-10). Must last 30 days.    Dispense:  90 tablet    Refill:  0    Chronic Pain: STOP Act (Not applicable) Fill 1 day early if closed on refill date. Avoid benzodiazepines within 8 hours of opioids   Orders:  No orders of the defined types were placed in this encounter.  Follow-up plan:   Return in about 3 months (around 06/29/2023) for MM, F2F.     Recent Visits No visits were found meeting these conditions. Showing recent visits within past 90 days and meeting all other requirements Today's Visits Date Type Provider Dept  03/28/23 Office Visit Edward Jolly, MD  [facility]  Showing today's visits and meeting all other requirements Future Appointments No visits were found meeting these conditions. Showing future appointments within next 90 days and meeting all other requirements  I discussed the assessment and treatment plan with the patient. The patient was provided an opportunity to ask questions and all were answered. The patient agreed with the plan and demonstrated an understanding of the instructions.  Patient advised to call back or seek an in-person evaluation if the symptoms or condition worsens.  Duration of encounter: .  Total time on encounter, as per AMA guidelines included both the face-to-face and non-face-to-face time personally spent by the physician and/or other qualified health care professional(s) on the day of the encounter (includes time in activities that require the physician or other qualified health care professional and does not include time in activities normally performed by clinical staff). Physician's time may include the following activities when performed: Preparing to see the patient (e.g., pre-charting review of records, searching for previously ordered imaging, lab work, and nerve conduction tests) Review of prior analgesic pharmacotherapies. Reviewing PMP Interpreting ordered tests (e.g., lab work, imaging, nerve conduction tests) Performing post-procedure evaluations, including interpretation of diagnostic procedures Obtaining and/or reviewing separately obtained history Performing a medically appropriate examination and/or evaluation Counseling and educating the patient/family/caregiver Ordering medications, tests, or procedures Referring and communicating with other health care professionals (when not separately reported) Documenting clinical information in the electronic or other health record Independently interpreting results (not separately reported) and communicating results to the  patient/ family/caregiver Care coordination (not separately reported)  Note by: Edward Jolly, MD Date: 03/28/2023; Time: 11:22 AM

## 2023-04-03 DIAGNOSIS — Q8501 Neurofibromatosis, type 1: Secondary | ICD-10-CM | POA: Diagnosis not present

## 2023-04-03 DIAGNOSIS — M40203 Unspecified kyphosis, cervicothoracic region: Secondary | ICD-10-CM | POA: Diagnosis not present

## 2023-04-24 ENCOUNTER — Encounter: Payer: Self-pay | Admitting: Family

## 2023-04-24 ENCOUNTER — Ambulatory Visit (INDEPENDENT_AMBULATORY_CARE_PROVIDER_SITE_OTHER): Payer: 59 | Admitting: Family

## 2023-04-24 ENCOUNTER — Other Ambulatory Visit: Payer: Self-pay | Admitting: Family

## 2023-04-24 VITALS — BP 118/84 | HR 128 | Ht 61.5 in | Wt 141.2 lb

## 2023-04-24 DIAGNOSIS — E559 Vitamin D deficiency, unspecified: Secondary | ICD-10-CM

## 2023-04-24 DIAGNOSIS — R7303 Prediabetes: Secondary | ICD-10-CM | POA: Diagnosis not present

## 2023-04-24 DIAGNOSIS — N1831 Chronic kidney disease, stage 3a: Secondary | ICD-10-CM

## 2023-04-24 DIAGNOSIS — E785 Hyperlipidemia, unspecified: Secondary | ICD-10-CM

## 2023-04-24 DIAGNOSIS — R300 Dysuria: Secondary | ICD-10-CM

## 2023-04-24 DIAGNOSIS — M545 Low back pain, unspecified: Secondary | ICD-10-CM

## 2023-04-24 DIAGNOSIS — E039 Hypothyroidism, unspecified: Secondary | ICD-10-CM | POA: Diagnosis not present

## 2023-04-24 DIAGNOSIS — Q8501 Neurofibromatosis, type 1: Secondary | ICD-10-CM

## 2023-04-24 DIAGNOSIS — I10 Essential (primary) hypertension: Secondary | ICD-10-CM | POA: Diagnosis not present

## 2023-04-24 DIAGNOSIS — E538 Deficiency of other specified B group vitamins: Secondary | ICD-10-CM

## 2023-04-24 DIAGNOSIS — G894 Chronic pain syndrome: Secondary | ICD-10-CM

## 2023-04-24 DIAGNOSIS — M546 Pain in thoracic spine: Secondary | ICD-10-CM

## 2023-04-24 LAB — POCT URINALYSIS DIPSTICK
Bilirubin, UA: NEGATIVE
Blood, UA: NEGATIVE
Glucose, UA: NEGATIVE
Ketones, UA: NEGATIVE
Nitrite, UA: NEGATIVE
Protein, UA: POSITIVE — AB
Spec Grav, UA: 1.02 (ref 1.010–1.025)
Urobilinogen, UA: 0.2 U/dL
pH, UA: 5.5 (ref 5.0–8.0)

## 2023-04-24 LAB — POC CREATINE & ALBUMIN,URINE
Creatinine, POC: 300 mg/dL
Microalbumin Ur, POC: 80 mg/L

## 2023-04-24 MED ORDER — NITROFURANTOIN MONOHYD MACRO 100 MG PO CAPS: 100.0000 mg | ORAL_CAPSULE | Freq: Two times a day (BID) | ORAL | 1 refills | Status: DC

## 2023-04-24 NOTE — Assessment & Plan Note (Signed)
 Blood pressure well controlled with current medications.  Continue current therapy.  Will reassess at follow up.

## 2023-04-24 NOTE — Assessment & Plan Note (Signed)
 Checking labs today.  Continue current therapy for lipid control. Will modify as needed based on labwork results.

## 2023-04-24 NOTE — Assessment & Plan Note (Signed)
 Patient is seen by Nephrology, who manage this condition.  She is well controlled with current therapy.   Will defer to them for further changes to plan of care.

## 2023-04-24 NOTE — Addendum Note (Signed)
 Addended by: Grayling Congress on: 04/24/2023 05:00 PM   Modules accepted: Level of Service

## 2023-04-24 NOTE — Progress Notes (Signed)
 Established Patient Office Visit  Subjective:  Patient ID: [name] [name], female    DOB: October 12, 1970  Age: 53 y.o. MRN: [id number]  Chief Complaint  Patient presents with   Follow-up    Discuss medications, possible UTI    Patient is here today for her  follow up.  She has been feeling fairly well since last appointment.   She does have additional concerns to discuss today.  Has been seeing Neurosurgery, NF tumor pressing on her esophagus and her spine.   Possible UTI. Sharp lower back pains, dysuria, urgency, frequency x about a month.  Says she knows she should have come in before now, but she has had so much going on.   Labs are due today. She needs refills.   I have reviewed her active problem list, medication list, allergies, notes from last encounter, lab results for her appointment today.    No other concerns at this time.   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   Renal disorder    Scoliosis of thoracic spine    C curve, convex to left   Thyroid  disease    controlled    Past Surgical History:  Procedure Laterality Date   BIOPSY  12/15/2022   Procedure: BIOPSY;  Surgeon: Onita Elspeth Sharper, DO;  Location: [facility] ENDOSCOPY;  Service: Gastroenterology;;   CESAREAN SECTION     chamberlain procedure     COLONOSCOPY WITH PROPOFOL  N/A 12/15/2022   Procedure: COLONOSCOPY WITH PROPOFOL ;  Surgeon: Onita Elspeth Sharper, DO;  Location: [facility] ENDOSCOPY;  Service: Gastroenterology;  Laterality: N/A;   ESOPHAGOGASTRODUODENOSCOPY (EGD) WITH PROPOFOL  N/A 12/15/2022   Procedure: ESOPHAGOGASTRODUODENOSCOPY (EGD) WITH PROPOFOL ;  Surgeon: Onita Elspeth Sharper, DO;  Location: [facility] ENDOSCOPY;  Service: Gastroenterology;  Laterality: N/A;   MALONEY DILATION  12/15/2022   Procedure: AGAPITO DILATION;   Surgeon: Onita Elspeth Sharper, DO;  Location: [facility] ENDOSCOPY;  Service: Gastroenterology;;   POLYPECTOMY  12/15/2022   Procedure: POLYPECTOMY;  Surgeon: Onita Elspeth Sharper, DO;  Location: [facility] ENDOSCOPY;  Service: Gastroenterology;;   THYROIDECTOMY      Social History   Socioeconomic History   Marital status: Single    Spouse name: Not on file   Number of children: Not on file   Years of education: Not on file   Highest education level: Not on file  Occupational History   Not on file  Tobacco Use   Smoking status: Never   Smokeless tobacco: Never   Tobacco comments:    this patient is a nonsmoker   Vaping Use   Vaping status: Never Used  Substance and Sexual Activity   Alcohol use: No    Alcohol/week: 0.0 standard drinks of alcohol   Drug use: No   Sexual activity: Not on file  Other Topics Concern   Not on file  Social History Narrative   Not on file   Social Drivers of Health   Financial Resource Strain: High Risk (03/27/2023)   Received from [facility]   Overall Financial Resource Strain (CARDIA)    Difficulty of Paying Living Expenses: Hard  Food Insecurity: Food Insecurity Present (03/27/2023)   Received from [facility]   Hunger Vital Sign    Worried About Running Out of Food in the  Last Year: Sometimes true    Ran Out of Food in the Last Year: Sometimes true  Transportation Needs: Unmet Transportation Needs (03/27/2023)   Received from [facility] - Transportation    In the past 12 months, has lack of transportation kept you from medical appointments or from getting medications?: No    Lack of Transportation (Non-Medical): Yes  Physical Activity: Not on file  Stress: Not on file  Social Connections: Not on file  Intimate Partner Violence: Not on file    Family History  Problem Relation Age of Onset   Cancer Mother    Breast cancer Neg Hx     Allergies  Allergen Reactions   Peanuts  [Peanut Oil] Hives and Other (See Comments)    Mouth ulcers   Penicillins Hives and Rash    Review of Systems  Musculoskeletal:  Positive for back pain.  All other systems reviewed and are negative.      Objective:   BP 118/84   Pulse (!) 128   Ht 5' 1.5 (1.562 m)   Wt 141 lb 3.2 oz (64 kg)   SpO2 98%   BMI 26.25 kg/m   Vitals:   04/24/23 1013  BP: 118/84  Pulse: (!) 128  Height: 5' 1.5 (1.562 m)  Weight: 141 lb 3.2 oz (64 kg)  SpO2: 98%  BMI (Calculated): 26.25    Physical Exam Vitals and nursing note reviewed.  Constitutional:      Appearance: Normal appearance. She is normal weight.  HENT:     Head: Normocephalic.  Eyes:     Extraocular Movements: Extraocular movements intact.     Conjunctiva/sclera: Conjunctivae normal.     Pupils: Pupils are equal, round, and reactive to light.  Cardiovascular:     Rate and Rhythm: Normal rate and regular rhythm.  Pulmonary:     Effort: Pulmonary effort is normal.  Musculoskeletal:        General: Normal range of motion.  Neurological:     General: No focal deficit present.     Mental Status: She is alert and oriented to person, place, and time. Mental status is at baseline.  Psychiatric:        Mood and Affect: Mood normal.        Behavior: Behavior normal.        Thought Content: Thought content normal.        Judgment: Judgment normal.      Results for orders placed or performed in visit on 04/24/23  POCT Urinalysis Dipstick (81002)  Result Value Ref Range   Color, UA dark yellow    Clarity, UA cloudy    Glucose, UA Negative Negative   Bilirubin, UA neg    Ketones, UA neg    Spec Grav, UA 1.020 1.010 - 1.025   Blood, UA neg    pH, UA 5.5 5.0 - 8.0   Protein, UA Positive (A) Negative   Urobilinogen, UA 0.2 0.2 or 1.0 E.U./dL   Nitrite, UA neg    Leukocytes, UA Small (1+) (A) Negative   Appearance cloudy    Odor yes   POC CREATINE & ALBUMIN,URINE  Result Value Ref Range   Microalbumin Ur, POC 80  mg/L   Creatinine, POC 300 mg/dL   Albumin/Creatinine Ratio, Urine, POC 30-300     Recent Results (from the past 2160 hours)  POCT Urinalysis Dipstick (18997)     Status: Abnormal   Collection Time: 04/24/23 10:47 AM  Result  Value Ref Range   Color, UA dark yellow    Clarity, UA cloudy    Glucose, UA Negative Negative   Bilirubin, UA neg    Ketones, UA neg    Spec Grav, UA 1.020 1.010 - 1.025   Blood, UA neg    pH, UA 5.5 5.0 - 8.0   Protein, UA Positive (A) Negative   Urobilinogen, UA 0.2 0.2 or 1.0 E.U./dL   Nitrite, UA neg    Leukocytes, UA Small (1+) (A) Negative   Appearance cloudy    Odor yes   POC CREATINE & ALBUMIN,URINE     Status: None   Collection Time: 04/24/23 10:57 AM  Result Value Ref Range   Microalbumin Ur, POC 80 mg/L   Creatinine, POC 300 mg/dL   Albumin/Creatinine Ratio, Urine, POC 30-300        Assessment & Plan:   Problem List Items Addressed This Visit       Cardiovascular and Mediastinum   Essential hypertension   Blood pressure well controlled with current medications.  Continue current therapy.  Will reassess at follow up.        Relevant Orders   CMP14+EGFR   CBC with Diff     Nervous and Auditory   Neurofibromatosis, type 1 (HCC)   Relevant Orders   CMP14+EGFR   CBC with Diff     Genitourinary   Chronic kidney disease, stage 3a (HCC) (Chronic)   Patient is seen by Nephrology, who manage this condition.  She is well controlled with current therapy.   Will defer to them for further changes to plan of care.         Other   Back pain - Primary   Relevant Orders   POCT Urinalysis Dipstick (81002) (Completed)   CMP14+EGFR   CBC with Diff   Urinalysis, Routine w reflex microscopic   Urine Culture   Dyslipidemia   Checking labs today.  Continue current therapy for lipid control. Will modify as needed based on labwork results.        Relevant Orders   Lipid panel   CMP14+EGFR   CBC with Diff   Other Visit Diagnoses        Vitamin D  deficiency, unspecified       Checking labs today.  Will continue supplements as needed.   Relevant Orders   VITAMIN D  25 Hydroxy (Vit-D Deficiency, Fractures)   CMP14+EGFR   CBC with Diff     B12 deficiency due to diet       Checking labs today.  Will continue supplements as needed.   Relevant Orders   CMP14+EGFR   Vitamin B12   CBC with Diff     Hypothyroidism (acquired)       Relevant Orders   CMP14+EGFR   TSH   CBC with Diff     Prediabetes       A1C is in prediabetic ranges. Patient counseled on dietary choices and verbalized understanding. Will reassess at follow up after next lab check.   Relevant Orders   CMP14+EGFR   Hemoglobin A1c   CBC with Diff   POC CREATINE & ALBUMIN,URINE (Completed)     Dysuria       UA in office today abnormal. Sending antibiotics and sending urine for culture. Will call with results   Relevant Orders   Urinalysis, Routine w reflex microscopic   Urine Culture       Return in about 3 months (around 07/23/2023).   Total time spent:  30 minutes  ALAN CHRISTELLA ARRANT, FNP  04/24/2023   This document may have been prepared by [facility] Voice Recognition software and as such may include unintentional dictation errors.

## 2023-04-25 ENCOUNTER — Other Ambulatory Visit: Payer: Self-pay | Admitting: *Deleted

## 2023-04-25 ENCOUNTER — Telehealth: Payer: Self-pay | Admitting: Student in an Organized Health Care Education/Training Program

## 2023-04-25 DIAGNOSIS — Q8501 Neurofibromatosis, type 1: Secondary | ICD-10-CM

## 2023-04-25 LAB — CMP14+EGFR
ALT: 25 [IU]/L (ref 0–32)
AST: 18 [IU]/L (ref 0–40)
Albumin: 4.6 g/dL (ref 3.8–4.9)
Alkaline Phosphatase: 159 [IU]/L — ABNORMAL HIGH (ref 44–121)
BUN/Creatinine Ratio: 15 (ref 9–23)
BUN: 11 mg/dL (ref 6–24)
Bilirubin Total: 0.6 mg/dL (ref 0.0–1.2)
CO2: 23 mmol/L (ref 20–29)
Calcium: 9.6 mg/dL (ref 8.7–10.2)
Chloride: 99 mmol/L (ref 96–106)
Creatinine, Ser: 0.75 mg/dL (ref 0.57–1.00)
Globulin, Total: 3.3 g/dL (ref 1.5–4.5)
Glucose: 87 mg/dL (ref 70–99)
Potassium: 4.7 mmol/L (ref 3.5–5.2)
Sodium: 138 mmol/L (ref 134–144)
Total Protein: 7.9 g/dL (ref 6.0–8.5)
eGFR: 96 mL/min/{1.73_m2} (ref >59)

## 2023-04-25 LAB — LIPID PANEL
Chol/HDL Ratio: 5.4 {ratio} — ABNORMAL HIGH (ref 0.0–4.4)
Cholesterol, Total: 247 mg/dL — ABNORMAL HIGH (ref 100–199)
HDL: 46 mg/dL (ref >39)
LDL Chol Calc (NIH): 168 mg/dL — ABNORMAL HIGH (ref 0–99)
Triglycerides: 177 mg/dL — ABNORMAL HIGH (ref 0–149)
VLDL Cholesterol Cal: 33 mg/dL (ref 5–40)

## 2023-04-25 LAB — VITAMIN D 25 HYDROXY (VIT D DEFICIENCY, FRACTURES): Vit D, 25-Hydroxy: 41.9 ng/mL (ref 30.0–100.0)

## 2023-04-25 LAB — CBC WITH DIFFERENTIAL/PLATELET
Basophils Absolute: 0.1 10*3/uL (ref 0.0–0.2)
Basos: 1 %
EOS (ABSOLUTE): 0.1 10*3/uL (ref 0.0–0.4)
Eos: 1 %
Hematocrit: 41.5 % (ref 34.0–46.6)
Hemoglobin: 13.6 g/dL (ref 11.1–15.9)
Immature Grans (Abs): 0 10*3/uL (ref 0.0–0.1)
Immature Granulocytes: 0 %
Lymphocytes Absolute: 2.8 10*3/uL (ref 0.7–3.1)
Lymphs: 37 %
MCH: 28.9 pg (ref 26.6–33.0)
MCHC: 32.8 g/dL (ref 31.5–35.7)
MCV: 88 fL (ref 79–97)
Monocytes Absolute: 0.6 10*3/uL (ref 0.1–0.9)
Monocytes: 8 %
Neutrophils Absolute: 3.9 10*3/uL (ref 1.4–7.0)
Neutrophils: 53 %
Platelets: 377 10*3/uL (ref 150–450)
RBC: 4.7 x10E6/uL (ref 3.77–5.28)
RDW: 12.4 % (ref 11.7–15.4)
WBC: 7.5 10*3/uL (ref 3.4–10.8)

## 2023-04-25 LAB — URINALYSIS, ROUTINE W REFLEX MICROSCOPIC
Bilirubin, UA: NEGATIVE
Glucose, UA: NEGATIVE
Ketones, UA: NEGATIVE
Nitrite, UA: NEGATIVE
RBC, UA: NEGATIVE
Specific Gravity, UA: 1.02 (ref 1.005–1.030)
Urobilinogen, Ur: 0.2 mg/dL (ref 0.2–1.0)
pH, UA: 5.5 (ref 5.0–7.5)

## 2023-04-25 LAB — MICROSCOPIC EXAMINATION
Casts: NONE SEEN /[LPF]
Epithelial Cells (non renal): >10 /[HPF] — AB (ref 0–10)
RBC, Urine: NONE SEEN /[HPF] (ref 0–2)

## 2023-04-25 LAB — VITAMIN B12: Vitamin B-12: 831 pg/mL (ref 232–1245)

## 2023-04-25 LAB — TSH: TSH: 0.473 u[IU]/mL (ref 0.450–4.500)

## 2023-04-25 LAB — HEMOGLOBIN A1C
Est. average glucose Bld gHb Est-mCnc: 103 mg/dL
Hgb A1c MFr Bld: 5.2 % (ref 4.8–5.6)

## 2023-04-25 MED ORDER — GABAPENTIN 600 MG PO TABS: 600.0000 mg | ORAL_TABLET | Freq: Four times a day (QID) | ORAL | 5 refills | Status: DC

## 2023-04-25 NOTE — Telephone Encounter (Signed)
 Patient is asking about a script for gabapentin. Says Dr Cherylann Ratel took this over from [place], patient needs script sent in. Her med refill date is in March. She was just here on Dec 20

## 2023-04-25 NOTE — Telephone Encounter (Signed)
Rx request sent to Dr. Lateef 

## 2023-04-25 NOTE — Telephone Encounter (Signed)
 Patient informed.

## 2023-04-26 LAB — URINE CULTURE

## 2023-04-29 ENCOUNTER — Encounter: Payer: Self-pay | Admitting: Family

## 2023-05-06 DIAGNOSIS — M40203 Unspecified kyphosis, cervicothoracic region: Secondary | ICD-10-CM | POA: Diagnosis not present

## 2023-05-06 DIAGNOSIS — M4304 Spondylolysis, thoracic region: Secondary | ICD-10-CM | POA: Diagnosis not present

## 2023-05-06 DIAGNOSIS — M4303 Spondylolysis, cervicothoracic region: Secondary | ICD-10-CM | POA: Diagnosis not present

## 2023-05-06 DIAGNOSIS — M47814 Spondylosis without myelopathy or radiculopathy, thoracic region: Secondary | ICD-10-CM | POA: Diagnosis not present

## 2023-05-06 DIAGNOSIS — M419 Scoliosis, unspecified: Secondary | ICD-10-CM | POA: Diagnosis not present

## 2023-05-18 ENCOUNTER — Encounter: Payer: Self-pay | Admitting: Student in an Organized Health Care Education/Training Program

## 2023-05-19 ENCOUNTER — Telehealth: Payer: Self-pay

## 2023-05-19 NOTE — Telephone Encounter (Signed)
 Good afternoon, Dr Marcelino. I just wanted to share my latest CT results of my Cervical/Thoracic spine. The increase in dosage and taking my pain meds TID, has really helped, I can't thank you enough.       I see a neurosurgeon on Feb 26th. To discuss treatment options. I'll request that they forward office notes to you & my PCP for my charts at each respective office.      If they do surgery, hopefully one day I can decrease some of my meds I take for chronic pain. I have a follow up with you on March 18th, which I plan on keeping.    Respectfully Yours   [name] [name]  Attachments  [id number]   [id number]   [id number]   [id number]   [id number]

## 2023-06-07 DIAGNOSIS — Q76412 Congenital kyphosis, cervical region: Secondary | ICD-10-CM | POA: Diagnosis not present

## 2023-06-07 DIAGNOSIS — M419 Scoliosis, unspecified: Secondary | ICD-10-CM | POA: Diagnosis not present

## 2023-06-09 ENCOUNTER — Other Ambulatory Visit: Payer: Self-pay | Admitting: Internal Medicine

## 2023-06-09 DIAGNOSIS — E559 Vitamin D deficiency, unspecified: Secondary | ICD-10-CM

## 2023-06-27 ENCOUNTER — Ambulatory Visit
Payer: Medicare HMO | Attending: Student in an Organized Health Care Education/Training Program | Admitting: Student in an Organized Health Care Education/Training Program

## 2023-06-27 ENCOUNTER — Encounter: Payer: Self-pay | Admitting: Student in an Organized Health Care Education/Training Program

## 2023-06-27 VITALS — BP 126/93 | HR 116 | Temp 97.2°F | Resp 16 | Ht 61.5 in | Wt 141.0 lb

## 2023-06-27 DIAGNOSIS — M4127 Other idiopathic scoliosis, lumbosacral region: Secondary | ICD-10-CM | POA: Insufficient documentation

## 2023-06-27 DIAGNOSIS — M546 Pain in thoracic spine: Secondary | ICD-10-CM | POA: Diagnosis not present

## 2023-06-27 DIAGNOSIS — M792 Neuralgia and neuritis, unspecified: Secondary | ICD-10-CM | POA: Insufficient documentation

## 2023-06-27 DIAGNOSIS — M545 Low back pain, unspecified: Secondary | ICD-10-CM | POA: Insufficient documentation

## 2023-06-27 DIAGNOSIS — G894 Chronic pain syndrome: Secondary | ICD-10-CM | POA: Diagnosis not present

## 2023-06-27 DIAGNOSIS — Q8501 Neurofibromatosis, type 1: Secondary | ICD-10-CM | POA: Diagnosis not present

## 2023-06-27 MED ORDER — HYDROCODONE-ACETAMINOPHEN 10-325 MG PO TABS: 1.0000 | ORAL_TABLET | Freq: Three times a day (TID) | ORAL | 0 refills | Status: DC | PRN

## 2023-06-27 NOTE — Progress Notes (Signed)
 PROVIDER NOTE: Information contained herein reflects review and annotations entered in association with encounter. Interpretation of such information and data should be left to medically-trained personnel. Information provided to patient can be located elsewhere in the medical record under "Patient Instructions". Document created using STT-dictation technology, any transcriptional errors that may result from process are unintentional.    Patient: [name] [name]  Service Category: E/M  Provider: Edward Jolly, MD  DOB: [birth year]  DOS: 06/27/2023  Referring Provider: Miki Kins, FNP  MRN: [id number]  Specialty: Interventional Pain Management  PCP: Miki Kins, FNP  Type: Established Patient  Setting: Ambulatory outpatient    Location: Office  Delivery: Face-to-face     HPI  [name], a 53 y.o. 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 [name]'s primary complain today is Back Pain (Thoracic midline ) and Neck Pain (Bilateral )  Pertinent problems: [name] has Back pain; Idiopathic scoliosis; Chest wall pain, chronic; Thoracic spine pain; Neurofibroma; Neuropathic pain; Neurofibromatosis, type 1 (HCC); and Chronic pain syndrome on their pertinent problem list. Pain Assessment: Severity of Chronic pain is reported as a 5 /10. Location: Back (bialteral neck) Mid, Left, Right/back pain goes around left shoulder/arm to the chest. Onset: More than a month ago. Quality: Discomfort, Sharp, Other (Comment), Burning, Constant (electric). Timing: Constant. Modifying factor(s): rest, heat and medications. Vitals:  height is 5' 1.5" (1.562 m) and weight is 141 lb (64 kg). Her temporal temperature is 97.2 F (36.2 C) (abnormal). Her blood pressure is 126/93 (abnormal) and her pulse is 116 (abnormal). Her respiration is 16 and oxygen saturation is 100%.  BMI: Estimated body mass index is 26.21 kg/m as calculated from  the following:   Height as of this encounter: 5' 1.5" (1.562 m).   Weight as of this encounter: 141 lb (64 kg). Last encounter: 03/28/23. Last procedure: Visit date not found.  Reason for encounter: medication management.  No change in medical history since last visit.  Patient's pain is at baseline. Experiencing improved pain relief with hydrocodone at 10 mg TID prn. Continues Amitriptyline 10 mg qhs Patient continues multimodal pain regimen as prescribed.  States that it provides pain relief and improvement in functional status.    Pharmacotherapy Assessment  Analgesic: Hydrocodone 10 mg TID PRN; MME equals 30    Monitoring: [place] PMP: PDMP reviewed during this encounter.       Pharmacotherapy: No side-effects or adverse reactions reported. Compliance: No problems identified. Effectiveness: Clinically acceptable.  Vernie Ammons, RN  06/27/2023 10:23 AM  Sign when Signing Visit Nursing Pain Medication Assessment:  Safety precautions to be maintained throughout the outpatient stay will include: orient to surroundings, keep bed in low position, maintain call bell within reach at all times, provide assistance with transfer out of bed and ambulation.  Medication Inspection Compliance: Pill count conducted under aseptic conditions, in front of the patient. Neither the pills nor the bottle was removed from the patient's sight at any time. Once count was completed pills were immediately returned to the patient in their original bottle.  Medication: Hydrocodone/APAP Pill/Patch Count:  7 of 90 pills remain Pill/Patch Appearance: Markings consistent with prescribed medication Bottle Appearance: Standard pharmacy container. Clearly labeled. Filled Date: 02 / 19 / 2025 Last Medication intake:  Today    No results found for: "CBDTHCR" No results found for: "D8THCCBX" No results found for: "D9THCCBX"  UDS:  Summary  Date Value Ref Range  Status  12/22/2022 Note  Final    Comment:     ==================================================================== [employer] Select 13 (MW) ==================================================================== Test                             Result       Flag       Units  Drug Present and Declared for Prescription Verification   Hydrocodone                    113          EXPECTED   ng/mg creat   Norhydrocodone                 207          EXPECTED   ng/mg creat    Sources of hydrocodone include scheduled prescription medications.    Norhydrocodone is an expected metabolite of hydrocodone.  ==================================================================== Test                      Result    Flag   Units      Ref Range   Creatinine              46               mg/dL      >=16 ==================================================================== Declared Medications:  The flagging and interpretation on this report are based on the  following declared medications.  Unexpected results may arise from  inaccuracies in the declared medications.   **Note: The testing scope of this panel includes these medications:   Hydrocodone (Norco)   **Note: The testing scope of this panel does not include the  following reported medications:   Acetaminophen (Tylenol)  Acetaminophen (Norco)  Albuterol (Ventolin HFA)  Amitriptyline (Elavil)  Bempedoic acid (Nexlizet)  Cyclobenzaprine (Flexeril)  Desvenlafaxine (Pristiq)  Ezetimibe (Nexlizet)  Gabapentin (Neurontin)  Metoprolol (Toprol)  Ondansetron (Zofran)  Pantoprazole (Protonix)  Sumatriptan (Imitrex)  Vitamin D2 (Drisdol) ==================================================================== For clinical consultation, please call [phone]. ====================================================================       ROS  Constitutional: Denies any fever or chills Gastrointestinal: No reported hemesis, hematochezia, vomiting, or acute GI distress Musculoskeletal: Denies any  acute onset joint swelling, redness, loss of ROM, or weakness Neurological:  paraesthesias   Medication Review  HYDROcodone-acetaminophen, SUMAtriptan, Vitamin D (Ergocalciferol), acetaminophen, albuterol, amitriptyline, cyclobenzaprine, desvenlafaxine, famotidine, gabapentin, metoprolol succinate, ondansetron, and pantoprazole  History Review  Allergy: [name] is allergic to peanuts [peanut oil] and penicillins. Drug: [name]  reports no history of drug use. Alcohol:  reports no history of alcohol use. Tobacco:  reports that she has never smoked. She has never used smokeless tobacco. Social: [name]  reports that she has never smoked. She has never used smokeless tobacco. She reports that she does not drink alcohol and does not use drugs. Medical:  has a past medical history of Asthma, H/O sleep apnea (07/09/2014), Headache, Hypertension, Left arm numbness (02/12/2020), Neurofibromatosis (HCC), Renal disorder, Scoliosis of thoracic spine, and Thyroid disease. Surgical: [name]  has a past surgical history that includes Cesarean section; chamberlain procedure; Thyroidectomy; Colonoscopy with propofol (N/A, 12/15/2022); Esophagogastroduodenoscopy (egd) with propofol (N/A, 12/15/2022); biopsy (12/15/2022); polypectomy (12/15/2022); and maloney dilation (12/15/2022). Family: family history includes Cancer in her mother.  Laboratory Chemistry Profile   Renal Lab Results  Component Value Date   BUN 11 04/24/2023   CREATININE 0.75 04/24/2023   BCR 15 04/24/2023  GFRAA >60 05/24/2018   GFRNONAA >60 01/16/2022    Hepatic Lab Results  Component Value Date   AST 18 04/24/2023   ALT 25 04/24/2023   ALBUMIN 4.6 04/24/2023   ALKPHOS 159 (H) 04/24/2023   LIPASE 28 01/16/2022    Electrolytes Lab Results  Component Value Date   NA 138 04/24/2023   K 4.7 04/24/2023   CL 99 04/24/2023   CALCIUM 9.6 04/24/2023    Bone Lab Results  Component Value Date   VD25OH 41.9 04/24/2023     Inflammation (CRP: Acute Phase) (ESR: Chronic Phase) No results found for: "CRP", "ESRSEDRATE", "LATICACIDVEN"       Note: Above Lab results reviewed.  Recent Imaging Review  CT CHEST W CONTRAST CLINICAL DATA:  Neurofibromatosis. Abnormal upper endoscopy is concern for esophageal obstruction.  EXAM: CT CHEST WITH CONTRAST  TECHNIQUE: Multidetector CT imaging of the chest was performed during intravenous contrast administration.  RADIATION DOSE REDUCTION: This exam was performed according to the departmental dose-optimization program which includes automated exposure control, adjustment of the mA and/or kV according to patient size and/or use of iterative reconstruction technique.  CONTRAST:  75mL ISOVUE-300 IOPAMIDOL (ISOVUE-300) INJECTION 61%  COMPARISON:  05/17/2009 chest CT.  04/16/2019 chest radiograph.  FINDINGS: Cardiovascular: Normal heart size. No significant pericardial effusion/thickening. Mildly atherosclerotic nonaneurysmal thoracic aorta. Normal caliber pulmonary arteries. No central pulmonary emboli.  Mediastinum/Nodes: Status post right hemithyroidectomy. Several hypodense left thyroid nodules, largest 1.3 cm. There are multiple similar low-attenuation paraspinal masses in the bilateral posterior upper mediastinum, which are chronic and increased in size since 05/16/2009 chest CT. Left anterior T1-2 low-attenuation paraspinal 3.4 x 3.0 x 5.0 cm mass with associated asymmetric left T1-2 neural foraminal widening (series 2/image 25), previously 2.6 x 1.8 x 2.9 cm, with extrinsic mass-effect on the left posterior upper esophagus. Similar left T5-6 low-attenuation paraspinal 3.6 x 2.8 x 3.4 cm mass (series 2/image 23), previously 2.7 x 2.2 x 1.8 cm. Similar right T3-4 low-attenuation paraspinal 2.8 x 2.2 x 2.3 cm mass (series 2/image 19), previously 2.1 x 1.6 x 2.0 cm. No pathologically enlarged axillary, mediastinal or hilar lymph nodes.  Lungs/Pleura: No  pneumothorax. No pleural effusion. No acute consolidative airspace disease, lung masses or significant pulmonary nodules.  Upper abdomen: Cholecystectomy.  Musculoskeletal: No aggressive appearing focal osseous lesions. Exaggerated upper thoracic kyphosis. Prominent upper thoracic S-shaped thoracic scoliosis. Mild-to-moderate upper thoracic spondylosis.  IMPRESSION: 1. Multiple similar low-attenuation paraspinal masses in the bilateral posterior upper mediastinum, which are chronic and increased in size since 05/16/2009 chest CT, the largest on the left anteriorly at T1-2 measuring 3.4 x 3.0 x 5.0 cm and demonstrating extrinsic mass-effect on the left posterior upper thoracic esophagus. 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 2. Prominent upper thoracic S-shaped thoracic scoliosis. Exaggerated upper thoracic kyphosis. Mild-to-moderate upper thoracic spondylosis. 3. Right hemithyroidectomy. Several hypodense left thyroid nodules, largest 1.3 cm. Thyroid ultrasound correlation may be obtained as clinically warranted. 4.  Aortic Atherosclerosis (ICD10-I70.0).  Electronically Signed   By: Delbert Phenix M.D.   On: 01/12/2023 11:58 Note: Reviewed        Physical Exam  General appearance: Well nourished, well developed, and well hydrated. In no apparent acute distress Mental status: Alert, oriented x 3 (person, place, & time)       Respiratory: No evidence of acute respiratory distress Eyes: PERLA Vitals: BP (!) 126/93 (BP Location: Right Arm, Patient Position: Sitting, Cuff Size: Normal)   Pulse (!) 116   Temp (!) 97.2 F (  36.2 C) (Temporal)   Resp 16   Ht 5' 1.5" (1.562 m)   Wt 141 lb (64 kg)   SpO2 100%   BMI 26.21 kg/m  BMI: Estimated body mass index is 26.21 kg/m as calculated from the following:   Height as of this encounter: 5' 1.5" (1.562 m).   Weight as of this encounter: 141 lb (64 kg). Ideal: Ideal body weight: 48.9 kg (107 lb 14.6  oz) Adjusted ideal body weight: 55 kg (121 lb 2.4 oz)  Assessment   Diagnosis Status  1. Neurofibromatosis, type 1 (von Recklinghausen's disease) (HCC)   2. Neuropathic pain   3. Back pain of thoracolumbar region   4. Other idiopathic scoliosis, lumbosacral region   5. Chronic pain syndrome     Deteriorating Having a Flare-up Having a Flare-up     Plan of Care    [name] has a current medication list which includes the following long-term medication(s): albuterol, amitriptyline, desvenlafaxine, famotidine, gabapentin, metoprolol succinate, pantoprazole, sumatriptan, [START ON 06/30/2023] hydrocodone-acetaminophen, [START ON 07/30/2023] hydrocodone-acetaminophen, and [START ON 08/29/2023] hydrocodone-acetaminophen.  Pharmacotherapy (Medications Ordered): Meds ordered this encounter  Medications   HYDROcodone-acetaminophen (NORCO) 10-325 MG tablet    Sig: Take 1 tablet by mouth every 8 (eight) hours as needed for severe pain (pain score 7-10). Must last 30 days.    Dispense:  90 tablet    Refill:  0    Chronic Pain: STOP Act (Not applicable) Fill 1 day early if closed on refill date. Avoid benzodiazepines within 8 hours of opioids   HYDROcodone-acetaminophen (NORCO) 10-325 MG tablet    Sig: Take 1 tablet by mouth every 8 (eight) hours as needed for severe pain (pain score 7-10). Must last 30 days.    Dispense:  90 tablet    Refill:  0    Chronic Pain: STOP Act (Not applicable) Fill 1 day early if closed on refill date. Avoid benzodiazepines within 8 hours of opioids   HYDROcodone-acetaminophen (NORCO) 10-325 MG tablet    Sig: Take 1 tablet by mouth every 8 (eight) hours as needed for severe pain (pain score 7-10). Must last 30 days.    Dispense:  90 tablet    Refill:  0    Chronic Pain: STOP Act (Not applicable) Fill 1 day early if closed on refill date. Avoid benzodiazepines within 8 hours of opioids   UDS UTD   Orders:  No orders of the defined types were placed  in this encounter.  Follow-up plan:   Return in about 3 months (around 09/27/2023) for MM, F2F.     Recent Visits No visits were found meeting these conditions. Showing recent visits within past 90 days and meeting all other requirements Today's Visits Date Type Provider Dept  06/27/23 Office Visit Edward Jolly, MD [facility]  Showing today's visits and meeting all other requirements Future Appointments No visits were found meeting these conditions. Showing future appointments within next 90 days and meeting all other requirements  I discussed the assessment and treatment plan with the patient. The patient was provided an opportunity to ask questions and all were answered. The patient agreed with the plan and demonstrated an understanding of the instructions.  Patient advised to call back or seek an in-person evaluation if the symptoms or condition worsens.  Duration of encounter: .  Total time on encounter, as per AMA guidelines included both the face-to-face and non-face-to-face time personally spent by the physician and/or other qualified health care professional(s) on  the day of the encounter (includes time in activities that require the physician or other qualified health care professional and does not include time in activities normally performed by clinical staff). Physician's time may include the following activities when performed: Preparing to see the patient (e.g., pre-charting review of records, searching for previously ordered imaging, lab work, and nerve conduction tests) Review of prior analgesic pharmacotherapies. Reviewing PMP Interpreting ordered tests (e.g., lab work, imaging, nerve conduction tests) Performing post-procedure evaluations, including interpretation of diagnostic procedures Obtaining and/or reviewing separately obtained history Performing a medically appropriate examination and/or evaluation Counseling and educating the  patient/family/caregiver Ordering medications, tests, or procedures Referring and communicating with other health care professionals (when not separately reported) Documenting clinical information in the electronic or other health record Independently interpreting results (not separately reported) and communicating results to the patient/ family/caregiver Care coordination (not separately reported)  Note by: Edward Jolly, MD Date: 06/27/2023; Time: 10:42 AM

## 2023-06-27 NOTE — Progress Notes (Signed)
 Nursing Pain Medication Assessment:  Safety precautions to be maintained throughout the outpatient stay will include: orient to surroundings, keep bed in low position, maintain call bell within reach at all times, provide assistance with transfer out of bed and ambulation.  Medication Inspection Compliance: Pill count conducted under aseptic conditions, in front of the patient. Neither the pills nor the bottle was removed from the patient's sight at any time. Once count was completed pills were immediately returned to the patient in their original bottle.  Medication: Hydrocodone/APAP Pill/Patch Count:  7 of 90 pills remain Pill/Patch Appearance: Markings consistent with prescribed medication Bottle Appearance: Standard pharmacy container. Clearly labeled. Filled Date: 02 / 19 / 2025 Last Medication intake:  Today

## 2023-06-27 NOTE — Patient Instructions (Signed)

## 2023-06-28 ENCOUNTER — Other Ambulatory Visit: Payer: Self-pay | Admitting: Internal Medicine

## 2023-06-28 DIAGNOSIS — I10 Essential (primary) hypertension: Secondary | ICD-10-CM

## 2023-06-29 ENCOUNTER — Other Ambulatory Visit: Payer: Self-pay

## 2023-06-29 DIAGNOSIS — K219 Gastro-esophageal reflux disease without esophagitis: Secondary | ICD-10-CM

## 2023-06-29 MED ORDER — PANTOPRAZOLE SODIUM 40 MG PO TBEC: 40.0000 mg | DELAYED_RELEASE_TABLET | Freq: Every day | ORAL | 3 refills | Status: AC

## 2023-07-13 ENCOUNTER — Other Ambulatory Visit: Payer: Self-pay | Admitting: Family

## 2023-07-13 DIAGNOSIS — M546 Pain in thoracic spine: Secondary | ICD-10-CM

## 2023-07-13 DIAGNOSIS — Q8501 Neurofibromatosis, type 1: Secondary | ICD-10-CM

## 2023-07-13 DIAGNOSIS — G894 Chronic pain syndrome: Secondary | ICD-10-CM

## 2023-07-24 ENCOUNTER — Encounter: Payer: Self-pay | Admitting: Family

## 2023-07-24 ENCOUNTER — Ambulatory Visit: Payer: 59 | Admitting: Family

## 2023-07-24 VITALS — BP 124/78 | HR 127 | Ht 61.5 in | Wt 144.6 lb

## 2023-07-24 DIAGNOSIS — N1831 Chronic kidney disease, stage 3a: Secondary | ICD-10-CM | POA: Diagnosis not present

## 2023-07-24 DIAGNOSIS — E039 Hypothyroidism, unspecified: Secondary | ICD-10-CM

## 2023-07-24 DIAGNOSIS — G894 Chronic pain syndrome: Secondary | ICD-10-CM | POA: Diagnosis not present

## 2023-07-24 DIAGNOSIS — E538 Deficiency of other specified B group vitamins: Secondary | ICD-10-CM | POA: Diagnosis not present

## 2023-07-24 DIAGNOSIS — E785 Hyperlipidemia, unspecified: Secondary | ICD-10-CM | POA: Diagnosis not present

## 2023-07-24 DIAGNOSIS — E559 Vitamin D deficiency, unspecified: Secondary | ICD-10-CM

## 2023-07-24 DIAGNOSIS — R7303 Prediabetes: Secondary | ICD-10-CM

## 2023-07-24 DIAGNOSIS — Q8501 Neurofibromatosis, type 1: Secondary | ICD-10-CM | POA: Diagnosis not present

## 2023-07-24 DIAGNOSIS — I10 Essential (primary) hypertension: Secondary | ICD-10-CM

## 2023-07-24 MED ORDER — DESVENLAFAXINE SUCCINATE ER 100 MG PO TB24: 100.0000 mg | ORAL_TABLET | Freq: Every day | ORAL | 1 refills | Status: DC

## 2023-07-24 MED ORDER — HYDROXYZINE PAMOATE 25 MG PO CAPS
25.0000 mg | ORAL_CAPSULE | Freq: Three times a day (TID) | ORAL | 0 refills | Status: AC | PRN
Start: 2023-07-24 — End: ?

## 2023-07-25 ENCOUNTER — Encounter: Payer: Self-pay | Admitting: Student in an Organized Health Care Education/Training Program

## 2023-07-25 LAB — CBC WITH DIFFERENTIAL/PLATELET
Basophils Absolute: 0.1 10*3/uL (ref 0.0–0.2)
Basos: 1 %
EOS (ABSOLUTE): 0.1 10*3/uL (ref 0.0–0.4)
Eos: 2 %
Hematocrit: 40.2 % (ref 34.0–46.6)
Hemoglobin: 13.1 g/dL (ref 11.1–15.9)
Immature Grans (Abs): 0 10*3/uL (ref 0.0–0.1)
Immature Granulocytes: 0 %
Lymphocytes Absolute: 2.8 10*3/uL (ref 0.7–3.1)
Lymphs: 42 %
MCH: 29.6 pg (ref 26.6–33.0)
MCHC: 32.6 g/dL (ref 31.5–35.7)
MCV: 91 fL (ref 79–97)
Monocytes Absolute: 0.5 10*3/uL (ref 0.1–0.9)
Monocytes: 7 %
Neutrophils Absolute: 3.2 10*3/uL (ref 1.4–7.0)
Neutrophils: 48 %
Platelets: 343 10*3/uL (ref 150–450)
RBC: 4.42 x10E6/uL (ref 3.77–5.28)
RDW: 12.3 % (ref 11.7–15.4)
WBC: 6.7 10*3/uL (ref 3.4–10.8)

## 2023-07-25 LAB — CMP14+EGFR
ALT: 8 IU/L (ref 0–32)
AST: 12 IU/L (ref 0–40)
Albumin: 4.5 g/dL (ref 3.8–4.9)
Alkaline Phosphatase: 154 IU/L — ABNORMAL HIGH (ref 44–121)
BUN/Creatinine Ratio: 16 (ref 9–23)
BUN: 12 mg/dL (ref 6–24)
Bilirubin Total: 0.6 mg/dL (ref 0.0–1.2)
CO2: 23 mmol/L (ref 20–29)
Calcium: 9.7 mg/dL (ref 8.7–10.2)
Chloride: 104 mmol/L (ref 96–106)
Creatinine, Ser: 0.76 mg/dL (ref 0.57–1.00)
Globulin, Total: 3.1 g/dL (ref 1.5–4.5)
Glucose: 98 mg/dL (ref 70–99)
Potassium: 3.6 mmol/L (ref 3.5–5.2)
Sodium: 144 mmol/L (ref 134–144)
Total Protein: 7.6 g/dL (ref 6.0–8.5)
eGFR: 94 mL/min/{1.73_m2} (ref >59)

## 2023-07-25 LAB — VITAMIN B12: Vitamin B-12: 628 pg/mL (ref 232–1245)

## 2023-07-25 LAB — HEMOGLOBIN A1C
Est. average glucose Bld gHb Est-mCnc: 105 mg/dL
Hgb A1c MFr Bld: 5.3 % (ref 4.8–5.6)

## 2023-07-25 LAB — LIPID PANEL
Chol/HDL Ratio: 5.8 ratio — ABNORMAL HIGH (ref 0.0–4.4)
Cholesterol, Total: 244 mg/dL — ABNORMAL HIGH (ref 100–199)
HDL: 42 mg/dL (ref >39)
LDL Chol Calc (NIH): 182 mg/dL — ABNORMAL HIGH (ref 0–99)
Triglycerides: 110 mg/dL (ref 0–149)
VLDL Cholesterol Cal: 20 mg/dL (ref 5–40)

## 2023-07-25 LAB — TSH: TSH: 0.791 u[IU]/mL (ref 0.450–4.500)

## 2023-07-25 LAB — VITAMIN D 25 HYDROXY (VIT D DEFICIENCY, FRACTURES): Vit D, 25-Hydroxy: 75.7 ng/mL (ref 30.0–100.0)

## 2023-09-08 ENCOUNTER — Other Ambulatory Visit: Payer: Self-pay | Admitting: Family

## 2023-09-08 DIAGNOSIS — G43E09 Chronic migraine with aura, not intractable, without status migrainosus: Secondary | ICD-10-CM

## 2023-09-14 ENCOUNTER — Encounter: Admitting: Student in an Organized Health Care Education/Training Program

## 2023-09-15 ENCOUNTER — Other Ambulatory Visit: Payer: Self-pay | Admitting: Family

## 2023-09-15 ENCOUNTER — Other Ambulatory Visit: Payer: Self-pay | Admitting: Internal Medicine

## 2023-09-15 DIAGNOSIS — G43E09 Chronic migraine with aura, not intractable, without status migrainosus: Secondary | ICD-10-CM

## 2023-09-15 DIAGNOSIS — K219 Gastro-esophageal reflux disease without esophagitis: Secondary | ICD-10-CM

## 2023-09-19 ENCOUNTER — Encounter: Admitting: Student in an Organized Health Care Education/Training Program

## 2023-09-28 ENCOUNTER — Ambulatory Visit: Admitting: Family

## 2023-10-12 ENCOUNTER — Encounter: Payer: Self-pay | Admitting: Nurse Practitioner

## 2023-10-12 ENCOUNTER — Ambulatory Visit: Attending: Student in an Organized Health Care Education/Training Program | Admitting: Nurse Practitioner

## 2023-10-12 VITALS — BP 124/98 | HR 138 | Temp 97.4°F | Ht 61.5 in | Wt 145.0 lb

## 2023-10-12 DIAGNOSIS — F1190 Opioid use, unspecified, uncomplicated: Secondary | ICD-10-CM | POA: Diagnosis present

## 2023-10-12 DIAGNOSIS — G894 Chronic pain syndrome: Secondary | ICD-10-CM | POA: Insufficient documentation

## 2023-10-12 DIAGNOSIS — M4127 Other idiopathic scoliosis, lumbosacral region: Secondary | ICD-10-CM | POA: Insufficient documentation

## 2023-10-12 DIAGNOSIS — Z79891 Long term (current) use of opiate analgesic: Secondary | ICD-10-CM | POA: Diagnosis not present

## 2023-10-12 DIAGNOSIS — M792 Neuralgia and neuritis, unspecified: Secondary | ICD-10-CM | POA: Insufficient documentation

## 2023-10-12 DIAGNOSIS — M546 Pain in thoracic spine: Secondary | ICD-10-CM | POA: Insufficient documentation

## 2023-10-12 DIAGNOSIS — Q8501 Neurofibromatosis, type 1: Secondary | ICD-10-CM | POA: Insufficient documentation

## 2023-10-12 DIAGNOSIS — D3610 Benign neoplasm of peripheral nerves and autonomic nervous system, unspecified: Secondary | ICD-10-CM | POA: Diagnosis not present

## 2023-10-12 MED ORDER — GABAPENTIN 600 MG PO TABS: 600.0000 mg | ORAL_TABLET | Freq: Four times a day (QID) | ORAL | 5 refills | Status: AC

## 2023-10-12 MED ORDER — HYDROCODONE-ACETAMINOPHEN 10-325 MG PO TABS: 1.0000 | ORAL_TABLET | Freq: Three times a day (TID) | ORAL | 0 refills | Status: DC | PRN

## 2023-10-12 NOTE — Progress Notes (Signed)
 PROVIDER NOTE: Interpretation of information contained herein should be left to medically-trained personnel. Specific patient instructions are provided elsewhere under Patient Instructions section of medical record. This document was created in part using AI and STT-dictation technology, any transcriptional errors that may result from this process are unintentional.  Patient: [name] [name]  Service: E/M   PCP: Orlean Alan HERO, FNP  DOB: June 03, 1970  DOS: 10/12/2023  Provider: Emmy MARLA Blanch, NP  MRN: [id number]  Delivery: Face-to-face  Specialty: Interventional Pain Management  Type: Established Patient  Setting: Ambulatory outpatient facility  Specialty designation: 09  Referring Prov.: Orlean Alan HERO, FNP  Location: Outpatient office facility       History of present illness (HPI) [name], a 53 y.o. year old female, is here today because of her Chronic pain syndrome [G89.4]. Ms. Werk's primary complain today is Neck Pain (Neck and T- Spine, been out of meds for 1 week )  Pertinent problems: Ms. Lackey has Back pain; Idiopathic scoliosis; Chest wall pain, chronic; Thoracic spine pain; Neurofibroma; Neuropathic pain; Neurofibromatosis, type 1 (HCC); and Chronic pain syndrome on their pertinent problem list.   Pain Assessment: Severity of Chronic pain is reported as a 8 /10. Location: Neck Right, Left, Mid/Radiates down left arm occasionally. Onset: More than a month ago. Quality: Constant, Numbness, Tightness, Stabbing. Timing: Constant. Modifying factor(s): Medication, Rest. Vitals:  height is 5' 1.5 (1.562 m) and weight is 145 lb (65.8 kg). Her temperature is 97.4 F (36.3 C) (abnormal). Her blood pressure is 124/98 (abnormal) and her pulse is 138 (abnormal). Her oxygen saturation is 100%.  BMI: Estimated body mass index is 26.95 kg/m as calculated from the following:   Height as of this encounter: 5' 1.5 (1.562 m).   Weight as of this encounter: 145 lb (65.8 kg).  Last  encounter: 06/27/2023 Last procedure: Visit date not found.  Reason for encounter: medication management. No change in medical history since last visit.  Patient's pain is at baseline.  Patient continues multimodal pain regimen as prescribed.  States that it provides pain relief and improvement in functional status.  Pharmacotherapy Assessment   Hydrocodone -acetaminophen  (Norco) 10-325 mg tablet every 8 hours as needed for severe pain.MME=30 Gabapentin  (Neurontin ) 600 mg tablet 4 times daily. Monitoring: [place] PMP: PDMP reviewed during this encounter.       Pharmacotherapy: No side-effects or adverse reactions reported. Compliance: No problems identified. Effectiveness: Clinically acceptable.  Erlene Doyal SAUNDERS, [place]  10/12/2023  8:36 AM  Sign when Signing Visit Nursing Pain Medication Assessment:  Safety precautions to be maintained throughout the outpatient stay will include: orient to surroundings, keep bed in low position, maintain call bell within reach at all times, provide assistance with transfer out of bed and ambulation.  Medication Inspection Compliance: Ms. Albino did not comply with our request to bring her pills to be counted. She was reminded that bringing the medication bottles, even when empty, is a requirement.  Medication: None brought in. Pill/Patch Count: None available to be counted. Bottle Appearance: No container available. Did not bring bottle(s) to appointment. Filled Date: N/A Last Medication intake:  Ran out of medicine more than 48 hours ago    UDS:  Summary  Date Value Ref Range Status  12/22/2022 Note  Final    Comment:    ==================================================================== [employer] Select 13 (MW) ==================================================================== Test  Result       Flag       Units  Drug Present and Declared for Prescription Verification   Hydrocodone                     113          EXPECTED    ng/mg creat   Norhydrocodone                 207          EXPECTED   ng/mg creat    Sources of hydrocodone  include scheduled prescription medications.    Norhydrocodone is an expected metabolite of hydrocodone .  ==================================================================== Test                      Result    Flag   Units      Ref Range   Creatinine              46               mg/dL      >=79 ==================================================================== Declared Medications:  The flagging and interpretation on this report are based on the  following declared medications.  Unexpected results may arise from  inaccuracies in the declared medications.   **Note: The testing scope of this panel includes these medications:   Hydrocodone  (Norco)   **Note: The testing scope of this panel does not include the  following reported medications:   Acetaminophen  (Tylenol )  Acetaminophen  (Norco)  Albuterol  (Ventolin  HFA)  Amitriptyline  (Elavil )  Bempedoic acid (Nexlizet )  Cyclobenzaprine  (Flexeril )  Desvenlafaxine  (Pristiq )  Ezetimibe (Nexlizet )  Gabapentin  (Neurontin )  Metoprolol  (Toprol )  Ondansetron  (Zofran )  Pantoprazole  (Protonix )  Sumatriptan  (Imitrex )  Vitamin D2 (Drisdol ) ==================================================================== For clinical consultation, please call [phone]. ====================================================================     No results found for: CBDTHCR No results found for: D8THCCBX No results found for: D9THCCBX  ROS  Constitutional: Denies any fever or chills Gastrointestinal: No reported hemesis, hematochezia, vomiting, or acute GI distress Musculoskeletal: Neck pain, thoracic spine pain Neurological: No reported episodes of acute onset apraxia, aphasia, dysarthria, agnosia, amnesia, paralysis, loss of coordination, or loss of consciousness  Medication Review  HYDROcodone -acetaminophen , SUMAtriptan , Vitamin  D (Ergocalciferol ), acetaminophen , albuterol , amitriptyline , cyclobenzaprine , desvenlafaxine , famotidine, gabapentin , hydrOXYzine , metoprolol  succinate, ondansetron , and pantoprazole   History Review  Allergy: Ms. Mcgeehan is allergic to peanuts [peanut oil] and penicillins. Drug: Ms. Finlayson  reports no history of drug use. Alcohol:  reports no history of alcohol use. Tobacco:  reports that she has never smoked. She has never used smokeless tobacco. Social: Ms. Hargraves  reports that she has never smoked. She has never used smokeless tobacco. She reports that she does not drink alcohol and does not use drugs. Medical:  has a past medical history of Asthma, H/O sleep apnea (07/09/2014), Headache, Hypertension, Left arm numbness (02/12/2020), Neurofibromatosis (HCC), Renal disorder, Scoliosis of thoracic spine, and Thyroid  disease. Surgical: Ms. Wiemers  has a past surgical history that includes Cesarean section; chamberlain procedure; Thyroidectomy; Colonoscopy with propofol  (N/A, 12/15/2022); Esophagogastroduodenoscopy (egd) with propofol  (N/A, 12/15/2022); biopsy (12/15/2022); polypectomy (12/15/2022); and maloney dilation (12/15/2022). Family: family history includes Cancer in her mother.  Laboratory Chemistry Profile   Renal Lab Results  Component Value Date   BUN 12 07/24/2023   CREATININE 0.76 07/24/2023   BCR 16 07/24/2023   GFRAA >60 05/24/2018   GFRNONAA >60 01/16/2022    Hepatic Lab Results  Component Value Date   AST 12 07/24/2023  ALT 8 07/24/2023   ALBUMIN 4.5 07/24/2023   ALKPHOS 154 (H) 07/24/2023   LIPASE 28 01/16/2022    Electrolytes Lab Results  Component Value Date   NA 144 07/24/2023   K 3.6 07/24/2023   CL 104 07/24/2023   CALCIUM 9.7 07/24/2023    Bone Lab Results  Component Value Date   VD25OH 75.7 07/24/2023    Inflammation (CRP: Acute Phase) (ESR: Chronic Phase) No results found for: CRP, ESRSEDRATE, LATICACIDVEN       Note: Above Lab results  reviewed.  Recent Imaging Review  CT CHEST W CONTRAST CLINICAL DATA:  Neurofibromatosis. Abnormal upper endoscopy is concern for esophageal obstruction.  EXAM: CT CHEST WITH CONTRAST  TECHNIQUE: Multidetector CT imaging of the chest was performed during intravenous contrast administration.  RADIATION DOSE REDUCTION: This exam was performed according to the departmental dose-optimization program which includes automated exposure control, adjustment of the mA and/or kV according to patient size and/or use of iterative reconstruction technique.  CONTRAST:  75mL ISOVUE -300 IOPAMIDOL  (ISOVUE -300) INJECTION 61%  COMPARISON:  05/17/2009 chest CT.  04/16/2019 chest radiograph.  FINDINGS: Cardiovascular: Normal heart size. No significant pericardial effusion/thickening. Mildly atherosclerotic nonaneurysmal thoracic aorta. Normal caliber pulmonary arteries. No central pulmonary emboli.  Mediastinum/Nodes: Status post right hemithyroidectomy. Several hypodense left thyroid  nodules, largest 1.3 cm. There are multiple similar low-attenuation paraspinal masses in the bilateral posterior upper mediastinum, which are chronic and increased in size since 05/16/2009 chest CT. Left anterior T1-2 low-attenuation paraspinal 3.4 x 3.0 x 5.0 cm mass with associated asymmetric left T1-2 neural foraminal widening (series 2/image 25), previously 2.6 x 1.8 x 2.9 cm, with extrinsic mass-effect on the left posterior upper esophagus. Similar left T5-6 low-attenuation paraspinal 3.6 x 2.8 x 3.4 cm mass (series 2/image 23), previously 2.7 x 2.2 x 1.8 cm. Similar right T3-4 low-attenuation paraspinal 2.8 x 2.2 x 2.3 cm mass (series 2/image 19), previously 2.1 x 1.6 x 2.0 cm. No pathologically enlarged axillary, mediastinal or hilar lymph nodes.  Lungs/Pleura: No pneumothorax. No pleural effusion. No acute consolidative airspace disease, lung masses or significant pulmonary nodules.  Upper abdomen:  Cholecystectomy.  Musculoskeletal: No aggressive appearing focal osseous lesions. Exaggerated upper thoracic kyphosis. Prominent upper thoracic S-shaped thoracic scoliosis. Mild-to-moderate upper thoracic spondylosis.  IMPRESSION: 1. Multiple similar low-attenuation paraspinal masses in the bilateral posterior upper mediastinum, which are chronic and increased in size since 05/16/2009 chest CT, the largest on the left anteriorly at T1-2 measuring 3.4 x 3.0 x 5.0 cm and demonstrating extrinsic mass-effect on the left posterior upper thoracic esophagus. 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 2. Prominent upper thoracic S-shaped thoracic scoliosis. Exaggerated upper thoracic kyphosis. Mild-to-moderate upper thoracic spondylosis. 3. Right hemithyroidectomy. Several hypodense left thyroid  nodules, largest 1.3 cm. Thyroid  ultrasound correlation may be obtained as clinically warranted. 4.  Aortic Atherosclerosis (ICD10-I70.0).  Electronically Signed   By: Selinda DELENA Blue M.D.   On: 01/12/2023 11:58 Note: Reviewed        Physical Exam  Vitals: BP (!) 124/98   Pulse (!) 138   Temp (!) 97.4 F (36.3 C)   Ht 5' 1.5 (1.562 m)   Wt 145 lb (65.8 kg)   SpO2 100%   BMI 26.95 kg/m  BMI: Estimated body mass index is 26.95 kg/m as calculated from the following:   Height as of this encounter: 5' 1.5 (1.562 m).   Weight as of this encounter: 145 lb (65.8 kg). Ideal: Ideal body weight: 48.9 kg (107 lb 14.6 oz) Adjusted ideal body  weight: 55.7 kg (122 lb 12 oz) General appearance: Well nourished, well developed, and well hydrated. In no apparent acute distress Mental status: Alert, oriented x 3 (person, place, & time)       Respiratory: No evidence of acute respiratory distress Eyes: PERLA   Assessment   Diagnosis Status  1. Chronic pain syndrome   2. Thoracic spine pain   3. Neuropathic pain   4. Neurofibroma   5. Other idiopathic scoliosis, lumbosacral region    6. Chronic, continuous use of opioids   7. Neurofibromatosis, type 1 (von Recklinghausen's disease) (HCC)    Controlled Controlled Controlled   Updated Problems: No problems updated.  Plan of Care  Problem-specific:  Assessment and Plan  We will continue on current medication regimen.  Prescribing drug monitoring (PDMP) reviewed; findings consistent with the use of prescribed medication and no evidence of narcotic misuse or abuse.  No other new issues or problems reported to this visit.  Schedule follow-up in 90 days for medication management.  Ms. Dorea L Morrissey has a current medication list which includes the following long-term medication(s): albuterol , amitriptyline , desvenlafaxine , famotidine, gabapentin , hydrocodone -acetaminophen , metoprolol  succinate, pantoprazole , and sumatriptan .  Pharmacotherapy (Medications Ordered): No orders of the defined types were placed in this encounter.  Orders:  No orders of the defined types were placed in this encounter.       No follow-ups on file.    Recent Visits No visits were found meeting these conditions. Showing recent visits within past 90 days and meeting all other requirements Today's Visits Date Type Provider Dept  10/12/23 Office Visit Angalina Ante K, NP [facility]  Showing today's visits and meeting all other requirements Future Appointments No visits were found meeting these conditions. Showing future appointments within next 90 days and meeting all other requirements  I discussed the assessment and treatment plan with the patient. The patient was provided an opportunity to ask questions and all were answered. The patient agreed with the plan and demonstrated an understanding of the instructions.  Patient advised to call back or seek an in-person evaluation if the symptoms or condition worsens.  Duration of encounter: 30 minutes.  Total time on encounter, as per AMA guidelines included both the face-to-face  and non-face-to-face time personally spent by the physician and/or other qualified health care professional(s) on the day of the encounter (includes time in activities that require the physician or other qualified health care professional and does not include time in activities normally performed by clinical staff). Physician's time may include the following activities when performed: Preparing to see the patient (e.g., pre-charting review of records, searching for previously ordered imaging, lab work, and nerve conduction tests) Review of prior analgesic pharmacotherapies. Reviewing PMP Interpreting ordered tests (e.g., lab work, imaging, nerve conduction tests) Performing post-procedure evaluations, including interpretation of diagnostic procedures Obtaining and/or reviewing separately obtained history Performing a medically appropriate examination and/or evaluation Counseling and educating the patient/family/caregiver Ordering medications, tests, or procedures Referring and communicating with other health care professionals (when not separately reported) Documenting clinical information in the electronic or other health record Independently interpreting results (not separately reported) and communicating results to the patient/ family/caregiver Care coordination (not separately reported)  Note by: Yona Stansbury K Maevis Mumby, NP (TTS and AI technology used. I apologize for any typographical errors that were not detected and corrected.) Date: 10/12/2023; Time: 8:41 AM

## 2023-10-12 NOTE — Progress Notes (Signed)
 Nursing Pain Medication Assessment:  Safety precautions to be maintained throughout the outpatient stay will include: orient to surroundings, keep bed in low position, maintain call bell within reach at all times, provide assistance with transfer out of bed and ambulation.  Medication Inspection Compliance: Ms. Bahar did not comply with our request to bring her pills to be counted. She was reminded that bringing the medication bottles, even when empty, is a requirement.  Medication: None brought in. Pill/Patch Count: None available to be counted. Bottle Appearance: No container available. Did not bring bottle(s) to appointment. Filled Date: N/A Last Medication intake:  Ran out of medicine more than 48 hours ago

## 2023-10-27 ENCOUNTER — Ambulatory Visit: Admitting: Family

## 2023-10-27 ENCOUNTER — Encounter: Payer: Self-pay | Admitting: Family

## 2023-10-27 VITALS — BP 126/82 | HR 107 | Ht 61.5 in | Wt 144.2 lb

## 2023-10-27 DIAGNOSIS — R2231 Localized swelling, mass and lump, right upper limb: Secondary | ICD-10-CM

## 2023-10-27 DIAGNOSIS — N1831 Chronic kidney disease, stage 3a: Secondary | ICD-10-CM

## 2023-10-28 NOTE — Progress Notes (Unsigned)
 Established Patient Office Visit  Subjective:  Patient ID: [name] [name], female    DOB: [birth year]  Age: 53 y.o. MRN: [id number]  Chief Complaint  Patient presents with  . Acute Visit    Nodule under right arm    HPI  No other concerns at this time.   Past Medical History:  Diagnosis Date  . Asthma   . H/O sleep apnea 07/09/2014  . Headache   . Hypertension    controlled  . Left arm numbness 02/12/2020  . Neurofibromatosis (HCC)    since birth; contributes to back pain  . Renal disorder   . Scoliosis of thoracic spine    C curve, convex to left  . Thyroid  disease    controlled    Past Surgical History:  Procedure Laterality Date  . BIOPSY  12/15/2022   Procedure: BIOPSY;  Surgeon: Onita Elspeth Sharper, DO;  Location: [facility] ENDOSCOPY;  Service: Gastroenterology;;  . CESAREAN SECTION    . chamberlain procedure    . COLONOSCOPY WITH PROPOFOL  N/A 12/15/2022   Procedure: COLONOSCOPY WITH PROPOFOL ;  Surgeon: Onita Elspeth Sharper, DO;  Location: [facility] ENDOSCOPY;  Service: Gastroenterology;  Laterality: N/A;  . ESOPHAGOGASTRODUODENOSCOPY (EGD) WITH PROPOFOL  N/A 12/15/2022   Procedure: ESOPHAGOGASTRODUODENOSCOPY (EGD) WITH PROPOFOL ;  Surgeon: Onita Elspeth Sharper, DO;  Location: [facility] ENDOSCOPY;  Service: Gastroenterology;  Laterality: N/A;  SABRA STAI DILATION  12/15/2022   Procedure: STAI DILATION;  Surgeon: Onita Elspeth Sharper, DO;  Location: [facility] ENDOSCOPY;  Service: Gastroenterology;;  . POLYPECTOMY  12/15/2022   Procedure: POLYPECTOMY;  Surgeon: Onita Elspeth Sharper, DO;  Location: [facility] ENDOSCOPY;  Service: Gastroenterology;;  . THYROIDECTOMY      Social History   Socioeconomic History  . Marital status: Single    Spouse name: Not on file  . Number of children: Not on file  . Years of education: Not on file  . Highest education level: Not on file  Occupational History  . Not on file  Tobacco Use  . Smoking status: Never  . Smokeless tobacco: Never  .  Tobacco comments:    this patient is a nonsmoker   Vaping Use  . Vaping status: Never Used  Substance and Sexual Activity  . Alcohol use: No    Alcohol/week: 0.0 standard drinks of alcohol  . Drug use: No  . Sexual activity: Not on file  Other Topics Concern  . Not on file  Social History Narrative  . Not on file   Social Drivers of Health   Financial Resource Strain: Low Risk  (06/07/2023)   Received [facility]   Overall Financial Resource Strain (CARDIA)   . Difficulty of Paying Living Expenses: Not hard at all  Recent Concern: Financial Resource Strain - High Risk (03/27/2023)   Received from [facility]   Overall Financial Resource Strain (CARDIA)   . Difficulty of Paying Living Expenses: Hard  Food Insecurity: No Food Insecurity (06/07/2023)   Received from [facility]   Hunger Vital Sign   . Within the past 12 months, you worried that your food would run out before you got the money to buy more.: Never true   . Within the past 12 months, the food you bought just didn't last and you didn't have money to get more.: Never true  Recent Concern: Food Insecurity - Food Insecurity Present (03/27/2023)   Received from [facility]   Hunger Vital Sign   . Worried About Radiation protection practitioner  of Food in the Last Year: Sometimes true   . Ran Out of Food in the Last Year: Sometimes true  Transportation Needs: No Transportation Needs (06/07/2023)   Received from [facility]   [facility] - Transportation   . In the past 12 months, has lack of transportation kept you from medical appointments or from getting medications?: No   . Lack of Transportation (Non-Medical): No  Recent Concern: Transportation Needs - Unmet Transportation Needs (03/27/2023)   Received from [facility]   [facility] - Transportation   . In the past 12 months, has lack of transportation kept you from medical appointments  or from getting medications?: No   . Lack of Transportation (Non-Medical): Yes  Physical Activity: Not on file  Stress: Not on file  Social Connections: Not on file  Intimate Partner Violence: Not on file    Family History  Problem Relation Age of Onset  . Cancer Mother   . Breast cancer Neg Hx     Allergies  Allergen Reactions  . Peanuts [Peanut Oil] Hives and Other (See Comments)    Mouth ulcers  . Penicillins Hives and Rash    Review of Systems  All other systems reviewed and are negative.      Objective:   BP 126/82   Pulse (!) 107   Ht 5' 1.5 (1.562 m)   Wt 144 lb 3.2 oz (65.4 kg)   SpO2 99%   BMI 26.81 kg/m   Vitals:   10/27/23 0954  BP: 126/82  Pulse: (!) 107  Height: 5' 1.5 (1.562 m)  Weight: 144 lb 3.2 oz (65.4 kg)  SpO2: 99%  BMI (Calculated): 26.81    Physical Exam Vitals and nursing note reviewed.  Constitutional:      Appearance: Normal appearance. She is normal weight.  HENT:     Head: Normocephalic.  Eyes:     Extraocular Movements: Extraocular movements intact.     Conjunctiva/sclera: Conjunctivae normal.     Pupils: Pupils are equal, round, and reactive to light.  Cardiovascular:     Rate and Rhythm: Normal rate.  Pulmonary:     Effort: Pulmonary effort is normal.  Neurological:     General: No focal deficit present.     Mental Status: She is alert and oriented to person, place, and time. Mental status is at baseline.  Psychiatric:        Mood and Affect: Mood normal.        Behavior: Behavior normal.        Thought Content: Thought content normal.      No results found for any visits on 10/27/23.  No results found for this or any previous visit (from the past 2160 hours).     Assessment & Plan:   Assessment & Plan Chronic kidney disease, stage 3a (HCC)  Lump of axilla, right     Return as previously scheduled.   Total time spent: {AMA time spent:29001} minutes  ALAN CHRISTELLA ARRANT, FNP  10/27/2023   This  document may have been prepared by [facility] Voice Recognition software and as such may include unintentional dictation errors.

## 2023-10-31 ENCOUNTER — Other Ambulatory Visit: Payer: Self-pay | Admitting: Internal Medicine

## 2023-10-31 DIAGNOSIS — Z1231 Encounter for screening mammogram for malignant neoplasm of breast: Secondary | ICD-10-CM

## 2023-11-03 ENCOUNTER — Other Ambulatory Visit: Payer: Self-pay | Admitting: Family

## 2023-11-03 DIAGNOSIS — Q8501 Neurofibromatosis, type 1: Secondary | ICD-10-CM

## 2023-11-03 DIAGNOSIS — M545 Low back pain, unspecified: Secondary | ICD-10-CM

## 2023-11-03 DIAGNOSIS — G894 Chronic pain syndrome: Secondary | ICD-10-CM

## 2023-11-05 ENCOUNTER — Encounter: Payer: Self-pay | Admitting: Family

## 2023-11-05 NOTE — Assessment & Plan Note (Signed)
Patient is seen by Neurology, who manage this condition.  She is well controlled with current therapy.   Will defer to them for further changes to plan of care.

## 2023-11-05 NOTE — Assessment & Plan Note (Signed)
Patient is seen by nephrology, who manage this condition.  She is well controlled with current therapy.   Will defer to them for further changes to plan of care.

## 2023-11-05 NOTE — Assessment & Plan Note (Signed)
 Checking labs today.  Continue current therapy for lipid control. Will modify as needed based on labwork results.   -CMP w/eGFR -Lipid Panel

## 2023-11-05 NOTE — Assessment & Plan Note (Signed)
 Patient is seen by Physiatry, who manage this condition.  She is well controlled with current therapy.   Will defer to them for further changes to plan of care.

## 2023-11-05 NOTE — Progress Notes (Signed)
 Established Patient Office Visit  Subjective:  Patient ID: [name] [name], female    DOB: [birth year]  Age: 53 y.o. MRN: [id number]  Chief Complaint  Patient presents with   Follow-up    3 month follow up    Patient is here today for her 3 months follow up.  She has been feeling well since last appointment.   She does not have additional concerns to discuss today.  Labs are due today.  She needs refills.   I have reviewed her active problem list, medication list, allergies, health maintenance, notes from last encounter, lab results for her appointment today.    No other concerns at this time.   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   Renal disorder    Scoliosis of thoracic spine    C curve, convex to left   Thyroid  disease    controlled    Past Surgical History:  Procedure Laterality Date   BIOPSY  12/15/2022   Procedure: BIOPSY;  Surgeon: Onita Elspeth Sharper, DO;  Location: [facility] ENDOSCOPY;  Service: Gastroenterology;;   CESAREAN SECTION     chamberlain procedure     COLONOSCOPY WITH PROPOFOL  N/A 12/15/2022   Procedure: COLONOSCOPY WITH PROPOFOL ;  Surgeon: Onita Elspeth Sharper, DO;  Location: [facility] ENDOSCOPY;  Service: Gastroenterology;  Laterality: N/A;   ESOPHAGOGASTRODUODENOSCOPY (EGD) WITH PROPOFOL  N/A 12/15/2022   Procedure: ESOPHAGOGASTRODUODENOSCOPY (EGD) WITH PROPOFOL ;  Surgeon: Onita Elspeth Sharper, DO;  Location: [facility] ENDOSCOPY;  Service: Gastroenterology;  Laterality: N/A;   MALONEY DILATION  12/15/2022   Procedure: AGAPITO DILATION;  Surgeon: Onita Elspeth Sharper, DO;  Location: [facility] ENDOSCOPY;  Service: Gastroenterology;;   POLYPECTOMY  12/15/2022   Procedure: POLYPECTOMY;  Surgeon: Onita Elspeth Sharper, DO;  Location: [facility] ENDOSCOPY;  Service: Gastroenterology;;    THYROIDECTOMY      Social History   Socioeconomic History   Marital status: Single    Spouse name: Not on file   Number of children: Not on file   Years of education: Not on file   Highest education level: Not on file  Occupational History   Not on file  Tobacco Use   Smoking status: Never   Smokeless tobacco: Never   Tobacco comments:    this patient is a nonsmoker   Vaping Use   Vaping status: Never Used  Substance and Sexual Activity   Alcohol use: No    Alcohol/week: 0.0 standard drinks of alcohol   Drug use: No   Sexual activity: Not on file  Other Topics Concern   Not on file  Social History Narrative   Not on file   Social Drivers of Health   Financial Resource Strain: Low Risk  (06/07/2023)   Received from [facility]   Overall Financial Resource Strain (CARDIA)    Difficulty of Paying Living Expenses: Not hard at all  Recent Concern: Financial Resource Strain - High Risk (03/27/2023)   Received from [facility]   Overall Financial Resource Strain (CARDIA)    Difficulty of Paying Living Expenses: Hard  Food Insecurity: No Food Insecurity (06/07/2023)   Received from [facility]   Hunger Vital Sign    Within the past 12 months, you worried that your food would run out before you  got the money to buy more.: Never true    Within the past 12 months, the food you bought just didn't last and you didn't have money to get more.: Never true  Recent Concern: Food Insecurity - Food Insecurity Present (03/27/2023)   Received from [facility]   Hunger Vital Sign    Worried About Running Out of Food in the Last Year: Sometimes true    Ran Out of Food in the Last Year: Sometimes true  Transportation Needs: No Transportation Needs (06/07/2023)   Received from [facility] - Transportation    In the past 12 months, has lack of transportation kept you from medical appointments or  from getting medications?: No    Lack of Transportation (Non-Medical): No  Recent Concern: Transportation Needs - Unmet Transportation Needs (03/27/2023)   Received from [facility] - Transportation    In the past 12 months, has lack of transportation kept you from medical appointments or from getting medications?: No    Lack of Transportation (Non-Medical): Yes  Physical Activity: Not on file  Stress: Not on file  Social Connections: Not on file  Intimate Partner Violence: Not on file    Family History  Problem Relation Age of Onset   Cancer Mother    Breast cancer Neg Hx     Allergies  Allergen Reactions   Peanuts [Peanut Oil] Hives and Other (See Comments)    Mouth ulcers   Penicillins Hives and Rash    Review of Systems  All other systems reviewed and are negative.      Objective:   BP 124/78   Pulse (!) 127   Ht 5' 1.5 (1.562 m)   Wt 144 lb 9.6 oz (65.6 kg)   SpO2 99%   BMI 26.88 kg/m   Vitals:   07/24/23 0946  BP: 124/78  Pulse: (!) 127  Height: 5' 1.5 (1.562 m)  Weight: 144 lb 9.6 oz (65.6 kg)  SpO2: 99%  BMI (Calculated): 26.88    Physical Exam Vitals and nursing note reviewed.  Constitutional:      Appearance: Normal appearance. She is normal weight.  HENT:     Head: Normocephalic.  Eyes:     Extraocular Movements: Extraocular movements intact.     Conjunctiva/sclera: Conjunctivae normal.     Pupils: Pupils are equal, round, and reactive to light.  Cardiovascular:     Rate and Rhythm: Normal rate.  Pulmonary:     Effort: Pulmonary effort is normal.  Neurological:     General: No focal deficit present.     Mental Status: She is alert and oriented to person, place, and time. Mental status is at baseline.  Psychiatric:        Mood and Affect: Mood normal.        Behavior: Behavior normal.        Thought Content: Thought content normal.      Results for orders placed or performed in visit on 07/24/23   Lipid panel  Result Value Ref Range   Cholesterol, Total 244 (H) 100 - 199 mg/dL   Triglycerides 889 0 - 149 mg/dL   HDL 42 >60 mg/dL   VLDL Cholesterol Cal 20 5 - 40 mg/dL   LDL Chol Calc (NIH) 817 (H) 0 - 99 mg/dL   Chol/HDL Ratio 5.8 (H) 0.0 - 4.4 ratio  VITAMIN D  25 Hydroxy (Vit-D Deficiency, Fractures)  Result Value Ref Range  Vit D, 25-Hydroxy 75.7 30.0 - 100.0 ng/mL  CMP14+EGFR  Result Value Ref Range   Glucose 98 70 - 99 mg/dL   BUN 12 6 - 24 mg/dL   Creatinine, Ser 9.23 0.57 - 1.00 mg/dL   eGFR 94 >40 fO/fpw/8.26   BUN/Creatinine Ratio 16 9 - 23   Sodium 144 134 - 144 mmol/L   Potassium 3.6 3.5 - 5.2 mmol/L   Chloride 104 96 - 106 mmol/L   CO2 23 20 - 29 mmol/L   Calcium 9.7 8.7 - 10.2 mg/dL   Total Protein 7.6 6.0 - 8.5 g/dL   Albumin 4.5 3.8 - 4.9 g/dL   Globulin, Total 3.1 1.5 - 4.5 g/dL   Bilirubin Total 0.6 0.0 - 1.2 mg/dL   Alkaline Phosphatase 154 (H) 44 - 121 IU/L   AST 12 0 - 40 IU/L   ALT 8 0 - 32 IU/L  TSH  Result Value Ref Range   TSH 0.791 0.450 - 4.500 uIU/mL  Hemoglobin A1c  Result Value Ref Range   Hgb A1c MFr Bld 5.3 4.8 - 5.6 %   Est. average glucose Bld gHb Est-mCnc 105 mg/dL  Vitamin A87  Result Value Ref Range   Vitamin B-12 628 232 - 1,245 pg/mL  CBC with Diff  Result Value Ref Range   WBC 6.7 3.4 - 10.8 x10E3/uL   RBC 4.42 3.77 - 5.28 x10E6/uL   Hemoglobin 13.1 11.1 - 15.9 g/dL   Hematocrit 59.7 65.9 - 46.6 %   MCV 91 79 - 97 fL   MCH 29.6 26.6 - 33.0 pg   MCHC 32.6 31.5 - 35.7 g/dL   RDW 87.6 88.2 - 84.5 %   Platelets 343 150 - 450 x10E3/uL   Neutrophils 48 Not Estab. %   Lymphs 42 Not Estab. %   Monocytes 7 Not Estab. %   Eos 2 Not Estab. %   Basos 1 Not Estab. %   Neutrophils Absolute 3.2 1.4 - 7.0 x10E3/uL   Lymphocytes Absolute 2.8 0.7 - 3.1 x10E3/uL   Monocytes Absolute 0.5 0.1 - 0.9 x10E3/uL   EOS (ABSOLUTE) 0.1 0.0 - 0.4 x10E3/uL   Basophils Absolute 0.1 0.0 - 0.2 x10E3/uL   Immature Granulocytes 0 Not Estab. %    Immature Grans (Abs) 0.0 0.0 - 0.1 x10E3/uL    No results found for this or any previous visit (from the past 2160 hours).     Assessment & Plan Essential hypertension Blood pressure well controlled with current medications.  Continue current therapy.  Will reassess at follow up.   - CBC w/Diff - CMP w/eGFR  Neurofibromatosis, type 1 (HCC) Patient is seen by Neurology, who manage this condition.  She is well controlled with current therapy.   Will defer to them for further changes to plan of care.  Chronic kidney disease, stage 3a [facility]) Patient is seen by nephrology, who manage this condition.  She is well controlled with current therapy.   Will defer to them for further changes to plan of care.  Chronic pain syndrome Patient is seen by Physiatry, who manage this condition.  She is well controlled with current therapy.   Will defer to them for further changes to plan of care.  Dyslipidemia Checking labs today.  Continue current therapy for lipid control. Will modify as needed based on labwork results.   -CMP w/eGFR -Lipid Panel  Vitamin D  deficiency, unspecified B12 deficiency due to diet Hypothyroidism (acquired) Checking labs today.  Will continue supplements as needed.   -  Vitamin D  - Vitamin B12 - TSH  Prediabetes A1C Continues to be in prediabetic ranges.  Will reassess at follow up after next lab check.  Patient counseled on dietary choices and verbalized understanding.   -CBC w/Diff -CMP w/eGFR -Hemoglobin A1C     Return in about 3 months (around 10/23/2023) for F/U.   Total time spent: 30 minutes  ALAN CHRISTELLA ARRANT, FNP  07/24/2023   This document may have been prepared by [facility] Voice Recognition software and as such may include unintentional dictation errors.

## 2023-11-05 NOTE — Assessment & Plan Note (Signed)
 Blood pressure well controlled with current medications.  Continue current therapy.  Will reassess at follow up.   - CBC w/Diff - CMP w/eGFR

## 2023-11-09 ENCOUNTER — Ambulatory Visit: Admitting: Family

## 2023-11-09 DIAGNOSIS — N1831 Chronic kidney disease, stage 3a: Secondary | ICD-10-CM

## 2023-11-09 DIAGNOSIS — R300 Dysuria: Secondary | ICD-10-CM | POA: Diagnosis not present

## 2023-11-09 LAB — POCT URINALYSIS DIPSTICK
Bilirubin, UA: NEGATIVE
Blood, UA: NEGATIVE
Glucose, UA: NEGATIVE
Ketones, UA: NEGATIVE
Nitrite, UA: NEGATIVE
Protein, UA: NEGATIVE
Spec Grav, UA: 1.02 (ref 1.010–1.025)
Urobilinogen, UA: 0.2 U/dL
pH, UA: 6 (ref 5.0–8.0)

## 2023-11-10 LAB — URINALYSIS, ROUTINE W REFLEX MICROSCOPIC
Bilirubin, UA: NEGATIVE
Glucose, UA: NEGATIVE
Ketones, UA: NEGATIVE
Nitrite, UA: NEGATIVE
RBC, UA: NEGATIVE
Specific Gravity, UA: 1.021 (ref 1.005–1.030)
Urobilinogen, Ur: 0.2 mg/dL (ref 0.2–1.0)
pH, UA: 6 (ref 5.0–7.5)

## 2023-11-10 LAB — MICROSCOPIC EXAMINATION
Casts: NONE SEEN /LPF
Epithelial Cells (non renal): >10 /HPF — AB (ref 0–10)
RBC, Urine: NONE SEEN /HPF (ref 0–2)

## 2023-11-11 LAB — URINE CULTURE

## 2023-11-12 NOTE — Progress Notes (Signed)
   CHIEF COMPLAINT  UA/ only visit fot UTI     REASON FOR VISIT  Possible UTI, UA Visit Only      ASSESSMENT & PLAN Diagnoses and all orders for this visit:  Chronic kidney disease, stage 3a (HCC) -     POCT urinalysis dipstick  Dysuria -     Urinalysis, Routine w reflex microscopic -     Urine Culture  Other orders -     Microscopic Examination     Patient notified.  Total time spent: 5 minutes  ALAN CHRISTELLA ARRANT, FNP 11/09/2023

## 2023-11-16 ENCOUNTER — Ambulatory Visit
Admission: RE | Admit: 2023-11-16 | Discharge: 2023-11-16 | Disposition: A | Discharge status: Home / Self Care | Source: Ambulatory Visit | Attending: Internal Medicine | Admitting: Internal Medicine

## 2023-11-16 DIAGNOSIS — Z1231 Encounter for screening mammogram for malignant neoplasm of breast: Secondary | ICD-10-CM | POA: Insufficient documentation

## 2023-11-23 ENCOUNTER — Encounter: Payer: Self-pay | Admitting: Family

## 2023-11-23 ENCOUNTER — Ambulatory Visit (INDEPENDENT_AMBULATORY_CARE_PROVIDER_SITE_OTHER): Admitting: Family

## 2023-11-23 DIAGNOSIS — E039 Hypothyroidism, unspecified: Secondary | ICD-10-CM | POA: Diagnosis not present

## 2023-11-23 DIAGNOSIS — R7303 Prediabetes: Secondary | ICD-10-CM | POA: Diagnosis not present

## 2023-11-23 DIAGNOSIS — G894 Chronic pain syndrome: Secondary | ICD-10-CM

## 2023-11-23 DIAGNOSIS — Q8501 Neurofibromatosis, type 1: Secondary | ICD-10-CM | POA: Diagnosis not present

## 2023-11-23 DIAGNOSIS — E538 Deficiency of other specified B group vitamins: Secondary | ICD-10-CM | POA: Diagnosis not present

## 2023-11-23 DIAGNOSIS — E559 Vitamin D deficiency, unspecified: Secondary | ICD-10-CM | POA: Diagnosis not present

## 2023-11-23 DIAGNOSIS — N1831 Chronic kidney disease, stage 3a: Secondary | ICD-10-CM | POA: Diagnosis not present

## 2023-11-23 DIAGNOSIS — E785 Hyperlipidemia, unspecified: Secondary | ICD-10-CM | POA: Diagnosis not present

## 2023-11-23 DIAGNOSIS — I10 Essential (primary) hypertension: Secondary | ICD-10-CM

## 2023-11-23 MED ORDER — METRONIDAZOLE 500 MG PO TABS: 500.0000 mg | ORAL_TABLET | Freq: Three times a day (TID) | ORAL | 0 refills | Status: AC

## 2023-11-23 MED ORDER — CIPROFLOXACIN HCL 500 MG PO TABS: 500.0000 mg | ORAL_TABLET | Freq: Two times a day (BID) | ORAL | 0 refills | Status: AC

## 2023-11-23 MED ORDER — DESVENLAFAXINE SUCCINATE ER 50 MG PO TB24: 50.0000 mg | ORAL_TABLET | Freq: Every day | ORAL | 3 refills | Status: DC

## 2023-11-23 NOTE — Progress Notes (Signed)
 Established Patient Office Visit  Subjective:  Patient ID: [name] [name], female    DOB: [birth year]  Age: 53 y.o. MRN: [id number]  Chief Complaint  Patient presents with   Follow-up    4 month follow up    Patient is here today for her 4 months follow up.  She has been feeling fairly well since last appointment.   She does have additional concerns to discuss today.  Labs are due today.  She needs refills.   I have reviewed her active problem list, medication list, allergies, notes from last encounter, lab results for her appointment today.      No other concerns at this time.   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   Renal disorder    Scoliosis of thoracic spine    C curve, convex to left   Thyroid  disease    controlled    Past Surgical History:  Procedure Laterality Date   BIOPSY  12/15/2022   Procedure: BIOPSY;  Surgeon: Onita Elspeth Sharper, DO;  Location: [facility] ENDOSCOPY;  Service: Gastroenterology;;   CESAREAN SECTION     chamberlain procedure     COLONOSCOPY WITH PROPOFOL  N/A 12/15/2022   Procedure: COLONOSCOPY WITH PROPOFOL ;  Surgeon: Onita Elspeth Sharper, DO;  Location: [facility] ENDOSCOPY;  Service: Gastroenterology;  Laterality: N/A;   ESOPHAGOGASTRODUODENOSCOPY (EGD) WITH PROPOFOL  N/A 12/15/2022   Procedure: ESOPHAGOGASTRODUODENOSCOPY (EGD) WITH PROPOFOL ;  Surgeon: Onita Elspeth Sharper, DO;  Location: [facility] ENDOSCOPY;  Service: Gastroenterology;  Laterality: N/A;   MALONEY DILATION  12/15/2022   Procedure: AGAPITO DILATION;  Surgeon: Onita Elspeth Sharper, DO;  Location: [facility] ENDOSCOPY;  Service: Gastroenterology;;   POLYPECTOMY  12/15/2022   Procedure: POLYPECTOMY;  Surgeon: Onita Elspeth Sharper, DO;  Location: [facility] ENDOSCOPY;  Service: Gastroenterology;;   THYROIDECTOMY       Social History   Socioeconomic History   Marital status: Single    Spouse name: Not on file   Number of children: Not on file   Years of education: Not on file   Highest education level: Not on file  Occupational History   Not on file  Tobacco Use   Smoking status: Never   Smokeless tobacco: Never   Tobacco comments:    this patient is a nonsmoker   Vaping Use   Vaping status: Never Used  Substance and Sexual Activity   Alcohol use: No    Alcohol/week: 0.0 standard drinks of alcohol   Drug use: No   Sexual activity: Not on file  Other Topics Concern   Not on file  Social History Narrative   Not on file   Social Drivers of Health   Financial Resource Strain: Low Risk  (06/07/2023)   Received from [facility]   Overall Financial Resource Strain (CARDIA)    Difficulty of Paying Living Expenses: Not hard at all  Recent Concern: Financial Resource Strain - High Risk (03/27/2023)   Received from [facility]   Overall Financial Resource Strain (CARDIA)    Difficulty of Paying Living Expenses: Hard  Food Insecurity: No Food Insecurity (06/07/2023)   Received from [facility]   Hunger Vital Sign    Within the past 12 months, you worried that your food would run out before you  got the money to buy more.: Never true    Within the past 12 months, the food you bought just didn't last and you didn't have money to get more.: Never true  Recent Concern: Food Insecurity - Food Insecurity Present (03/27/2023)   Received from [facility]   Hunger Vital Sign    Worried About Running Out of Food in the Last Year: Sometimes true    Ran Out of Food in the Last Year: Sometimes true  Transportation Needs: No Transportation Needs (06/07/2023)   Received from [facility]    In the past 12 months, has lack of transportation kept you from medical appointments or from getting  medications?: No    Lack of Transportation (Non-Medical): No  Recent Concern: Transportation Needs - Unmet Transportation Needs (03/27/2023)   Received from [facility]    In the past 12 months, has lack of transportation kept you from medical appointments or from getting medications?: No    Lack of Transportation (Non-Medical): Yes  Physical Activity: Not on file  Stress: Not on file  Social Connections: Not on file  Intimate Partner Violence: Not on file    Family History  Problem Relation Age of Onset   Cancer Mother    Breast cancer Neg Hx     Allergies  Allergen Reactions   Peanuts [Peanut Oil] Hives and Other (See Comments)    Mouth ulcers   Penicillins Hives and Rash    Review of Systems  All other systems reviewed and are negative.      Objective:   BP 106/80   Pulse (!) 110   Ht 5' 1.5 (1.562 m)   Wt 148 lb 6.4 oz (67.3 kg)   SpO2 99%   BMI 27.59 kg/m   Vitals:   11/23/23 0947  BP: 106/80  Pulse: (!) 110  Height: 5' 1.5 (1.562 m)  Weight: 148 lb 6.4 oz (67.3 kg)  SpO2: 99%  BMI (Calculated): 27.59    Physical Exam Vitals and nursing note reviewed.  Constitutional:      General: She is awake.     Appearance: Normal appearance. She is well-developed, well-groomed and overweight.  HENT:     Head: Normocephalic.  Eyes:     Extraocular Movements: Extraocular movements intact.     Conjunctiva/sclera: Conjunctivae normal.     Pupils: Pupils are equal, round, and reactive to light.  Cardiovascular:     Rate and Rhythm: Normal rate and regular rhythm.  Pulmonary:     Effort: Pulmonary effort is normal.  Neurological:     General: No focal deficit present.     Mental Status: She is alert and oriented to person, place, and time. Mental status is at baseline.  Psychiatric:        Mood and Affect: Mood normal.        Behavior: Behavior normal. Behavior is cooperative.        Thought Content: Thought  content normal.      No results found for any visits on 11/23/23.  Recent Results (from the past 2160 hours)  POCT urinalysis dipstick     Status: Abnormal   Collection Time: 11/09/23  9:36 AM  Result Value Ref Range   Color, UA     Clarity, UA     Glucose, UA Negative Negative   Bilirubin, UA negative    Ketones, UA negative    Spec Grav,  UA 1.020 1.010 - 1.025   Blood, UA negative    pH, UA 6.0 5.0 - 8.0   Protein, UA Negative Negative   Urobilinogen, UA 0.2 0.2 or 1.0 E.U./dL   Nitrite, UA negative    Leukocytes, UA Trace (A) Negative   Appearance     Odor    Urine Culture     Status: None   Collection Time: 11/09/23 11:58 AM   Specimen: Urine, Clean Catch   CU  Result Value Ref Range   Urine Culture, Routine Final report    Organism ID, Bacteria Comment     Comment: Mixed urogenital flora 10,000-25,000 colony forming units per mL   Urinalysis, Routine w reflex microscopic     Status: Abnormal   Collection Time: 11/09/23 11:59 AM  Result Value Ref Range   Specific Gravity, UA 1.021 1.005 - 1.030   pH, UA 6.0 5.0 - 7.5   Color, UA Yellow Yellow   Appearance Ur Clear Clear   Leukocytes,UA Trace (A) Negative   Protein,UA Trace Negative/Trace   Glucose, UA Negative Negative   Ketones, UA Negative Negative   RBC, UA Negative Negative   Bilirubin, UA Negative Negative   Urobilinogen, Ur 0.2 0.2 - 1.0 mg/dL   Nitrite, UA Negative Negative   Microscopic Examination See below:     Comment: Microscopic was indicated and was performed.  Microscopic Examination     Status: Abnormal   Collection Time: 11/09/23 11:59 AM  Result Value Ref Range   WBC, UA 0-5 0 - 5 /hpf   RBC, Urine None seen 0 - 2 /hpf   Epithelial Cells (non renal) >10 (A) 0 - 10 /hpf   Casts None seen None seen /lpf   Bacteria, UA Few None seen/Few       Assessment & Plan Chronic kidney disease, stage 3a (HCC)  Essential hypertension Blood pressure well controlled with current medications.   Continue current therapy.  Will reassess at follow up.   - CBC w/Diff - CMP w/eGFR  Neurofibromatosis, type 1 (HCC) Patient is seen by neurology, who manage this condition.  She is well controlled with current therapy.   Will defer to them for further changes to plan of care.  Chronic kidney disease, stage 3a [facility]) Patient is seen by nephrology, who manage this condition.  She is well controlled with current therapy.   Will defer to them for further changes to plan of care.  Chronic pain syndrome Patient is seen by pain clinic, who manage this condition.  She is well controlled with current therapy.   Will defer to them for further changes to plan of care.  Dyslipidemia Checking labs today.  Continue current therapy for lipid control. Will modify as needed based on labwork results.   -CMP w/eGFR -Lipid Panel  Vitamin D  deficiency, unspecified B12 deficiency due to diet Hypothyroidism (acquired) Checking labs today.  Will continue supplements as needed.   - Vitamin D  - Vitamin B12 - TSH  Prediabetes A1C Continues to be in prediabetic ranges.  Will reassess at follow up after next lab check.  Patient counseled on dietary choices and verbalized understanding.   -CBC w/Diff -CMP w/eGFR -Hemoglobin A1C     Return in about 4 months (around 03/24/2024).   Total time spent: 20 minutes  ALAN CHRISTELLA ARRANT, FNP  11/23/2023   This document may have been prepared by [facility] Voice Recognition software and as such may include unintentional dictation errors.

## 2023-11-23 NOTE — Assessment & Plan Note (Signed)
 Blood pressure well controlled with current medications.  Continue current therapy.  Will reassess at follow up.   - CBC w/Diff - CMP w/eGFR

## 2023-11-23 NOTE — Assessment & Plan Note (Signed)
 Patient is seen by pain clinic, who manage this condition.  She is well controlled with current therapy.   Will defer to them for further changes to plan of care.

## 2023-11-23 NOTE — Assessment & Plan Note (Signed)
Patient is seen by nephrology, who manage this condition.  She is well controlled with current therapy.   Will defer to them for further changes to plan of care.

## 2023-11-23 NOTE — Assessment & Plan Note (Signed)
 Checking labs today.  Continue current therapy for lipid control. Will modify as needed based on labwork results.   -CMP w/eGFR -Lipid Panel

## 2023-11-23 NOTE — Assessment & Plan Note (Signed)
 Patient is seen by neurology, who manage this condition.  She is well controlled with current therapy.   Will defer to them for further changes to plan of care.

## 2023-11-24 LAB — CBC WITH DIFFERENTIAL/PLATELET
Basophils Absolute: 0.1 x10E3/uL (ref 0.0–0.2)
Basos: 1 %
EOS (ABSOLUTE): 0.2 x10E3/uL (ref 0.0–0.4)
Eos: 3 %
Hematocrit: 40.8 % (ref 34.0–46.6)
Hemoglobin: 13 g/dL (ref 11.1–15.9)
Immature Grans (Abs): 0 x10E3/uL (ref 0.0–0.1)
Immature Granulocytes: 0 %
Lymphocytes Absolute: 2.4 x10E3/uL (ref 0.7–3.1)
Lymphs: 37 %
MCH: 29.9 pg (ref 26.6–33.0)
MCHC: 31.9 g/dL (ref 31.5–35.7)
MCV: 94 fL (ref 79–97)
Monocytes Absolute: 0.5 x10E3/uL (ref 0.1–0.9)
Monocytes: 8 %
Neutrophils Absolute: 3.2 x10E3/uL (ref 1.4–7.0)
Neutrophils: 51 %
Platelets: 340 x10E3/uL (ref 150–450)
RBC: 4.35 x10E6/uL (ref 3.77–5.28)
RDW: 12.6 % (ref 11.7–15.4)
WBC: 6.3 x10E3/uL (ref 3.4–10.8)

## 2023-11-24 LAB — CMP14+EGFR
ALT: 12 IU/L (ref 0–32)
AST: 13 IU/L (ref 0–40)
Albumin: 4.4 g/dL (ref 3.8–4.9)
Alkaline Phosphatase: 145 IU/L — ABNORMAL HIGH (ref 44–121)
BUN/Creatinine Ratio: 14 (ref 9–23)
BUN: 10 mg/dL (ref 6–24)
Bilirubin Total: 0.5 mg/dL (ref 0.0–1.2)
CO2: 23 mmol/L (ref 20–29)
Calcium: 10.5 mg/dL — ABNORMAL HIGH (ref 8.7–10.2)
Chloride: 100 mmol/L (ref 96–106)
Creatinine, Ser: 0.7 mg/dL (ref 0.57–1.00)
Globulin, Total: 3.2 g/dL (ref 1.5–4.5)
Glucose: 85 mg/dL (ref 70–99)
Potassium: 5.2 mmol/L (ref 3.5–5.2)
Sodium: 138 mmol/L (ref 134–144)
Total Protein: 7.6 g/dL (ref 6.0–8.5)
eGFR: 103 mL/min/1.73 (ref >59)

## 2023-11-24 LAB — LIPID PANEL
Chol/HDL Ratio: 4.8 ratio — ABNORMAL HIGH (ref 0.0–4.4)
Cholesterol, Total: 233 mg/dL — ABNORMAL HIGH (ref 100–199)
HDL: 49 mg/dL (ref >39)
LDL Chol Calc (NIH): 162 mg/dL — ABNORMAL HIGH (ref 0–99)
Triglycerides: 122 mg/dL (ref 0–149)
VLDL Cholesterol Cal: 22 mg/dL (ref 5–40)

## 2023-11-24 LAB — VITAMIN D 25 HYDROXY (VIT D DEFICIENCY, FRACTURES): Vit D, 25-Hydroxy: 73.5 ng/mL (ref 30.0–100.0)

## 2023-11-24 LAB — TSH: TSH: 0.79 u[IU]/mL (ref 0.450–4.500)

## 2023-11-24 LAB — HEMOGLOBIN A1C
Est. average glucose Bld gHb Est-mCnc: 108 mg/dL
Hgb A1c MFr Bld: 5.4 % (ref 4.8–5.6)

## 2023-11-24 LAB — VITAMIN B12: Vitamin B-12: 711 pg/mL (ref 232–1245)

## 2023-11-27 ENCOUNTER — Ambulatory Visit: Payer: Self-pay | Admitting: Family

## 2023-12-23 ENCOUNTER — Other Ambulatory Visit: Payer: Self-pay | Admitting: Family

## 2023-12-23 DIAGNOSIS — G894 Chronic pain syndrome: Secondary | ICD-10-CM

## 2023-12-23 DIAGNOSIS — Q8501 Neurofibromatosis, type 1: Secondary | ICD-10-CM

## 2023-12-23 DIAGNOSIS — M546 Pain in thoracic spine: Secondary | ICD-10-CM

## 2024-01-10 ENCOUNTER — Encounter: Payer: Self-pay | Admitting: Nurse Practitioner

## 2024-01-10 ENCOUNTER — Ambulatory Visit: Attending: Nurse Practitioner | Admitting: Nurse Practitioner

## 2024-01-10 VITALS — BP 121/93 | HR 119 | Temp 97.0°F | Resp 18 | Ht 61.5 in | Wt 145.0 lb

## 2024-01-10 DIAGNOSIS — M546 Pain in thoracic spine: Secondary | ICD-10-CM | POA: Diagnosis not present

## 2024-01-10 DIAGNOSIS — M4127 Other idiopathic scoliosis, lumbosacral region: Secondary | ICD-10-CM | POA: Insufficient documentation

## 2024-01-10 DIAGNOSIS — F1190 Opioid use, unspecified, uncomplicated: Secondary | ICD-10-CM | POA: Diagnosis present

## 2024-01-10 DIAGNOSIS — Z79891 Long term (current) use of opiate analgesic: Secondary | ICD-10-CM | POA: Diagnosis not present

## 2024-01-10 DIAGNOSIS — G8929 Other chronic pain: Secondary | ICD-10-CM | POA: Insufficient documentation

## 2024-01-10 DIAGNOSIS — G894 Chronic pain syndrome: Secondary | ICD-10-CM | POA: Insufficient documentation

## 2024-01-10 DIAGNOSIS — Z79899 Other long term (current) drug therapy: Secondary | ICD-10-CM | POA: Diagnosis not present

## 2024-01-10 DIAGNOSIS — M542 Cervicalgia: Secondary | ICD-10-CM | POA: Insufficient documentation

## 2024-01-10 DIAGNOSIS — M25551 Pain in right hip: Secondary | ICD-10-CM | POA: Diagnosis not present

## 2024-01-10 MED ORDER — NALOXONE HCL 4 MG/0.1ML NA LIQD: 1.0000 | NASAL | 1 refills | Status: AC | PRN

## 2024-01-10 MED ORDER — HYDROCODONE-ACETAMINOPHEN 10-325 MG PO TABS: 1.0000 | ORAL_TABLET | Freq: Three times a day (TID) | ORAL | 0 refills | Status: DC | PRN

## 2024-01-10 NOTE — Progress Notes (Signed)
 PROVIDER NOTE: Interpretation of information contained herein should be left to medically-trained personnel. Specific patient instructions are provided elsewhere under Patient Instructions section of medical record. This document was created in part using AI and STT-dictation technology, any transcriptional errors that may result from this process are unintentional.  Patient: [name] [name]  Service: E/M   PCP: Orlean Alan HERO, FNP  DOB: 01-21-1971  DOS: 01/10/2024  Provider: Emmy MARLA Blanch, NP  MRN: [id number]  Delivery: Face-to-face  Specialty: Interventional Pain Management  Type: Established Patient  Setting: Ambulatory outpatient facility  Specialty designation: 09  Referring Prov.: Orlean Alan HERO, FNP  Location: Outpatient office facility       History of present illness (HPI) [name] [name], a 53 y.o. year old female, is here today because of her Upper back pain (tumor) and right hip pain. [name] [name]'s primary complain today is Hip Pain (Right ) and Back Pain  Pertinent problems: [name] [name] has Back pain; Idiopathic scoliosis; Chest wall pain, chronic; Thoracic spine pain; Neurofibroma; Neuropathic pain; Neurofibromatosis, type 1 (HCC); and Chronic pain syndrome on their pertinent problem list.  Pain Assessment: Severity of Chronic pain is reported as a 8 /10. Location: Back Mid/Right hip. Onset: More than a 6 month ago. Quality: Stabbing, Sharp, Aching. Timing: Constant. Modifying factor(s): Rest, Medication, Heat. Vitals:  height is 5' 1.5 (1.562 m) and weight is 145 lb (65.8 kg). Her temporal temperature is 97 F (36.1 C) (abnormal). Her blood pressure is 121/93 (abnormal) and her pulse is 119 (abnormal). Her respiration is 18 and oxygen saturation is 100%.  BMI: Estimated body mass index is 26.95 kg/m as calculated from the following:   Height as of this encounter: 5' 1.5 (1.562 m).   Weight as of this encounter: 145 lb (65.8 kg).  Last encounter: 10/12/2023. Last procedure:  Visit date not found.  Reason for encounter: medication management and evaluation of worsening problem.  No significant changes in medical history since the last visit.  However, the patient reports worsening right hip pain over the past few days.  She continues to experience persistent upper back pain related to idiopathic scoliosis and upper back tumor.  She rates her pain level at 8/10, describing it as sharp, aching, and stabbing in nature.  We discussed obtaining a right hip x-ray for further evaluation. Patient continues multimodal pain regimen as prescribed. States that it provides pain relief and improvement in functional status.  Pharmacotherapy Assessment   Hydrocodone -acetaminophen  (Norco) 10-325 mg tablet every 8 hours as needed for severe pain. MME=30 Monitoring: [place] PMP: PDMP reviewed during this encounter.       Pharmacotherapy: No side-effects or adverse reactions reported. Compliance: No problems identified. Effectiveness: Clinically acceptable.  Erlene Doyal SAUNDERS, [place]  01/10/2024  8:55 AM  Sign when Signing Visit Nursing Pain Medication Assessment:  Safety precautions to be maintained throughout the outpatient stay will include: orient to surroundings, keep bed in low position, maintain call bell within reach at all times, provide assistance with transfer out of bed and ambulation.  Medication Inspection Compliance: [name] [name] did not comply with our request to bring her pills to be counted. She was reminded that bringing the medication bottles, even when empty, is a requirement.  Medication: None brought in. Pill/Patch Count: None available to be counted. Bottle Appearance: No container available. Did not bring bottle(s) to appointment. Filled Date: N/A Last Medication intake:  Yesterday    UDS:  Summary  Date Value Ref Range Status  12/22/2022 Note  Final    Comment:    ==================================================================== [employer] Select 13  (MW) ==================================================================== Test                             Result       Flag       Units  Drug Present and Declared for Prescription Verification   Hydrocodone                     113          EXPECTED   ng/mg creat   Norhydrocodone                 207          EXPECTED   ng/mg creat    Sources of hydrocodone  include scheduled prescription medications.    Norhydrocodone is an expected metabolite of hydrocodone .  ==================================================================== Test                      Result    Flag   Units      Ref Range   Creatinine              46               mg/dL      >=79 ==================================================================== Declared Medications:  The flagging and interpretation on this report are based on the  following declared medications.  Unexpected results may arise from  inaccuracies in the declared medications.   **Note: The testing scope of this panel includes these medications:   Hydrocodone  (Norco)   **Note: The testing scope of this panel does not include the  following reported medications:   Acetaminophen  (Tylenol )  Acetaminophen  (Norco)  Albuterol  (Ventolin  HFA)  Amitriptyline  (Elavil )  Bempedoic acid (Nexlizet )  Cyclobenzaprine  (Flexeril )  Desvenlafaxine  (Pristiq )  Ezetimibe (Nexlizet )  Gabapentin  (Neurontin )  Metoprolol  (Toprol )  Ondansetron  (Zofran )  Pantoprazole  (Protonix )  Sumatriptan  (Imitrex )  Vitamin D2 (Drisdol ) ==================================================================== For clinical consultation, please call [phone]. ====================================================================     No results found for: CBDTHCR No results found for: D8THCCBX No results found for: D9THCCBX  ROS  Constitutional: Denies any fever or chills Gastrointestinal: No reported hemesis, hematochezia, vomiting, or acute GI distress Musculoskeletal:  Right hip pain, upper back pain Neurological: No reported episodes of acute onset apraxia, aphasia, dysarthria, agnosia, amnesia, paralysis, loss of coordination, or loss of consciousness  Medication Review  HYDROcodone -acetaminophen , SUMAtriptan , Vitamin D  (Ergocalciferol ), acetaminophen , albuterol , amitriptyline , cyclobenzaprine , desvenlafaxine , famotidine, gabapentin , hydrOXYzine , metoprolol  succinate, ondansetron , and pantoprazole   History Review  Allergy: [name] [name] is allergic to peanuts [peanut oil] and penicillins. Drug: [name] [name]  reports no history of drug use. Alcohol:  reports no history of alcohol use. Tobacco:  reports that she has never smoked. She has never used smokeless tobacco. Social: [name] [name]  reports that she has never smoked. She has never used smokeless tobacco. She reports that she does not drink alcohol and does not use drugs. Medical:  has a past medical history of Asthma, H/O sleep apnea (07/09/2014), Headache, Hypertension, Left arm numbness (02/12/2020), Neurofibromatosis (HCC), Renal disorder, Scoliosis of thoracic spine, and Thyroid  disease. Surgical: [name] [name]  has a past surgical history that includes Cesarean section; chamberlain procedure; Thyroidectomy; Colonoscopy with propofol  (N/A, 12/15/2022); Esophagogastroduodenoscopy (egd) with propofol  (N/A, 12/15/2022); biopsy (12/15/2022); polypectomy (12/15/2022); and maloney dilation (12/15/2022). Family: family history includes Cancer in her mother.  Laboratory Chemistry Profile   Renal Lab Results  Component Value Date   BUN 10 11/23/2023   CREATININE 0.70 11/23/2023   BCR 14 11/23/2023   GFRAA >60 05/24/2018   GFRNONAA >60 01/16/2022    Hepatic Lab Results  Component Value Date   AST 13 11/23/2023   ALT 12 11/23/2023   ALBUMIN 4.4 11/23/2023   ALKPHOS 145 (H) 11/23/2023   LIPASE 28 01/16/2022    Electrolytes Lab Results  Component Value Date   NA 138 11/23/2023   K 5.2 11/23/2023   CL 100  11/23/2023   CALCIUM 10.5 (H) 11/23/2023    Bone Lab Results  Component Value Date   VD25OH 73.5 11/23/2023    Inflammation (CRP: Acute Phase) (ESR: Chronic Phase) No results found for: CRP, ESRSEDRATE, LATICACIDVEN       Note: Above Lab results reviewed.  Recent Imaging Review  Narrative  MRI CERVICAL SPINE WITH AND WITHOUT CONTRAST, MRI THORACIC SPINE WITH AND WITHOUT CONTRAST  Indication: Paraspinal mass/tumor, NF-1 with enlarging paraspinal masses; thoracic surgical planning, R22.2 Localized swelling, mass and lump, trunk, Z78.9 Other specified health status, Q85.01 Neurofibromatosis, type 1 (CMS/HHS-HCC)  Comparison: CT chest 12/28/2022 CT chest 05/16/2009  Technique: Multiplanar MR sequences were performed of the cervical spine and thoracic prior to and following the IV administration of contrast.  Findings: MRI cervical spine: Low-lying cerebellar tonsils are present, measuring 6-7 mm, suggestive of Chiari I malformation. The cervical spinal cord is normal in signal and caliber. Alignment is normal. Bone marrow signal is normal. Soft tissues are unremarkable. No abnormal enhancement.  C2-C3: No disc protrusion, canal stenosis, or foraminal stenosis.  C3-C4: Small disc osteophyte complex. Moderate uncovertebral hypertrophy and facet arthropathy. Moderate spinal canal stenosis. Moderate to severe right and mild left neuroforaminal stenosis.  C4-C5: Tiny disc osteophyte complex. Mild uncovertebral hypertrophy and facet arthropathy. Mild spinal canal stenosis. Moderate right and mild left neuroforaminal stenosis.  C5-C6: Small disc osteophyte complex. Mild uncovertebral hypertrophy and facet arthropathy. Mild spinal canal and bilateral neuroforaminal stenosis.  C6-C7: Tiny disc osteophyte complex. No high-grade spinal canal or neuroforaminal stenosis.   MR thoracic spine: Severe S-shaped scoliosis of the upper thoracic spine. Exaggerated thoracic kyphosis  of the upper thoracic spine.  There is prominent scalloping thinning and remodeling along the upper thoracic spine vertebral bodies as well as the left first through third ribs.   Long segment of cord signal abnormality and atrophy is present extending from T1 to approximately T5, representing myelopathy.  There is a prominent dural ectasia involving the upper thoracic spine with remodeling and widening extending through the upper thoracic spine neural foramen, more pronounced on the left involving at least the left T1, T2, T3, and likely T4 neural foramen. On the right this is most pronounced and likely involving the right T3-T4 neural foramen.  Prominent edema and enhancement is present involving the adjacent upper thoracic paraspinal and intercostal musculature, most pronounced on the left, measuring 6.7 x 4.4 cm (series 22 image 24). Again there is remodeling along the adjacent left first and third ribs. 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   Enhancing lesion involving the right T1-T2 neural foramen measuring approximately 1.9 cm. Findings are concerning for peripheral nerve sheath tumors/neurofibroma .   Status post right thyroidectomy, heterogeneous appearance of the left thyroid  gland.  Impression: Low-lying cerebellar tonsils are present, measuring 6-7 mm, suggestive of Chiari I malformation.  Long segment of cord signal abnormality and atrophy is present extending from T1 to approximately T5, representing myelopathy.  Prominent regions of dural ectasia  present along the upper thoracic spine with prominent scalloping thinning and remodeling of the upper thoracic spine vertebral bodies. Findings were present on the CT chest from 05/16/2009 although have progressed since.  Prominent edema and enhancement is present involving the adjacent upper thoracic paraspinal and intercostal musculature measuring up to 6.7 cm on the left. 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 Findings were present on the CT chest from 05/16/2009 although have progressed since.  Enhancing lesion involving the right T1-T2 neural foramen measuring up to 1.9 cm, concerning for peripheral nerve sheath tumor/neurofibroma.  Electronically Signed by:  Alyce Prime, MD Electronically Signed on:  03/07/2023 11:25 AM  MR thoracic spine:  Severe S-shaped scoliosis of the upper thoracic spine. Exaggerated thoracic  kyphosis of the upper thoracic spine.   There is prominent scalloping thinning and remodeling along the upper  thoracic spine vertebral bodies as well as the left first through third  ribs.    Long segment of cord signal abnormality and atrophy is present extending  from T1 to approximately T5, representing myelopathy.   There is a prominent dural ectasia involving the upper thoracic spine with  remodeling and widening extending through the upper thoracic spine neural  foramen, more pronounced on the left involving at least the left T1, T2,  T3, and likely T4 neural foramen. On the right this is most pronounced and  likely involving the right T3-T4 neural foramen.   Prominent edema and enhancement is present involving the adjacent upper  thoracic paraspinal and intercostal musculature, most pronounced on the  left, measuring 6.7 x 4.4 cm (series 22 image 24). Again there is  remodeling along the adjacent left first and third ribs. 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    Enhancing lesion involving the right T1-T2 neural foramen measuring  approximately 1.9 cm. Findings are concerning for peripheral nerve sheath  tumors/neurofibroma .    Status post right thyroidectomy, heterogeneous appearance of the left  thyroid  gland.   Impression:  Low-lying cerebellar tonsils are present, measuring 6-7 mm, suggestive of  Chiari I malformation.   Long segment of cord signal abnormality and atrophy is present extending  from T1 to  approximately T5, representing myelopathy.   Prominent regions of dural ectasia present along the upper thoracic spine  with prominent scalloping thinning and remodeling of the upper thoracic  spine vertebral bodies. Findings were present on the CT chest from 05/16/2009  although have progressed since.   Prominent edema and enhancement is present involving the adjacent upper  thoracic paraspinal and intercostal musculature measuring up to 6.7 cm on  the left. 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 Findings  were present on the CT chest from 05/16/2009 although have progressed since.   Enhancing lesion involving the right T1-T2 neural foramen measuring up to  1.9 cm, concerning for peripheral nerve sheath tumor/neurofibroma.   Electronically Signed by:  Alyce Prime, MD  Electronically Signed on:  03/07/2023 11:25 AM   Physical Exam  Vitals: BP (!) 121/93 (BP Location: Right Arm, Patient Position: Sitting, Cuff Size: Normal)   Pulse (!) 119   Temp (!) 97 F (36.1 C) (Temporal)   Resp 18   Ht 5' 1.5 (1.562 m)   Wt 145 lb (65.8 kg)   SpO2 100%   BMI 26.95 kg/m  BMI: Estimated body mass index is 26.95 kg/m as calculated from the following:   Height as of this encounter: 5' 1.5 (1.562 m).   Weight as of this  encounter: 145 lb (65.8 kg). Ideal: Ideal body weight: 48.9 kg (107 lb 14.6 oz) Adjusted ideal body weight: 55.7 kg (122 lb 12 oz) General appearance: Well nourished, well developed, and well hydrated. In no apparent acute distress Mental status: Alert, oriented x 3 (person, place, & time)       Respiratory: No evidence of acute respiratory distress Eyes: PERLA  Musculoskeletal: + Right hip pain worsening with weight bearing, prolong standing, and walking  Upper back pain (due to scoliosis and tumor)  Assessment   Diagnosis Status  1. Chronic right hip pain   2. Chronic midline thoracic back pain   3. Chronic pain syndrome   4. Neck pain   5. Other  idiopathic scoliosis, lumbosacral region   6. Chronic, continuous use of opioids   7. Medication management    Worsening Controlled Controlled   Updated Problems: Problem  Medication Management  Chronic Right Hip Pain    Plan of Care  Problem-specific:  Assessment and Plan  Chronic right hip pain: The patient reports chronic right hip pain, which was previously intermittent back and has now become persistent over the past 2 weeks.  Her last right hip x-ray was completed in 2016 at [facility].  We discussed obtaining updated imaging for further evaluation, with consideration of possible bursitis versus arthritic changes.  Chronic pain syndrome:  Patient's pain is controlled with hydrocodone -acetaminophen  (Norco), will continue on current medication regimen.  Prescribing drug monitoring (PMP) reviewed; findings consistent with the use of prescribed medication and no evidence of narcotic misuse or abuse. Routine UDS ordered today.  The patient was also reminded to bring her pill bottle to the next visit for pill count in order to continue with medication management.  Schedule follow-up in 90 days for medication management.  Chronic thoracic spine pain: The patient has idiopathic scoliosis and a tumor on her upper back, which contribute to her pain.  However, her pain is currently controlled with her prescribed Norco pain medication regimen.   [name] [name] has a current medication list which includes the following long-term medication(s): albuterol , amitriptyline , desvenlafaxine , famotidine, gabapentin , metoprolol  succinate, pantoprazole , sumatriptan , [START ON 01/11/2024] hydrocodone -acetaminophen , [START ON 02/10/2024] hydrocodone -acetaminophen , and [START ON 03/11/2024] hydrocodone -acetaminophen .  Pharmacotherapy (Medications Ordered): Meds ordered this encounter  Medications   HYDROcodone -acetaminophen  (NORCO) 10-325 MG tablet    Sig: Take 1 tablet by mouth every 8 (eight) hours as  needed for severe pain (pain score 7-10). Must last 30 days.    Dispense:  90 tablet    Refill:  0    Chronic Pain: STOP Act (Not applicable) Fill 1 day early if closed on refill date. Avoid benzodiazepines within 8 hours of opioids   HYDROcodone -acetaminophen  (NORCO) 10-325 MG tablet    Sig: Take 1 tablet by mouth every 8 (eight) hours as needed for severe pain (pain score 7-10). Must last 30 days.    Dispense:  90 tablet    Refill:  0    Chronic Pain: STOP Act (Not applicable) Fill 1 day early if closed on refill date. Avoid benzodiazepines within 8 hours of opioids   HYDROcodone -acetaminophen  (NORCO) 10-325 MG tablet    Sig: Take 1 tablet by mouth every 8 (eight) hours as needed for severe pain (pain score 7-10). Must last 30 days.    Dispense:  90 tablet    Refill:  0    Chronic Pain: STOP Act (Not applicable) Fill 1 day early if closed on refill date. Avoid benzodiazepines within 8 hours of  opioids   Orders:  Orders Placed This Encounter  Procedures   DG HIP UNILAT W OR W/O PELVIS 2-3 VIEWS RIGHT    Please describe any evidence of DJD, such as joint narrowing, asymmetry, cysts, or any anomalies in bone density, production, or erosion.    Standing Status:   Future    Expiration Date:   04/11/2024    Scheduling Instructions:     Please make sure that the patient understands that this needs to be done as soon as possible. Never have the patient do the imaging just before the next appointment. Inform patient that having the imaging done within the [facility] will expedite the availability of the results and will provide      imaging availability to the requesting physician. In addition inform the patient that the imaging order has an expiration date and will not be renewed if not done within the active period.    Reason for Exam (SYMPTOM  OR DIAGNOSIS REQUIRED):   Right hip pain/arthralgia    Is the patient pregnant?:   No    Preferred imaging location?:   [facility]    Call  Results- Best Contact Number?:   [phone]    Release to patient:   Immediate   ToxASSURE Select 13 (MW), Urine    Volume: 30 ml(s). Minimum 3 ml of urine is needed. Document temperature of fresh sample. Indications: Long term (current) use of opiate analgesic (S20.108)    Release to patient:   Immediate        Return in about 3 months (around 04/11/2024) for (F2F), (MM), Emmy Blanch NP.    Recent Visits Date Type Provider Dept  10/12/23 Office Visit Maleah Rabago K, NP [facility]  Showing recent visits within past 90 days and meeting all other requirements Today's Visits Date Type Provider Dept  01/10/24 Office Visit Reet Scharrer K, NP [facility]  Showing today's visits and meeting all other requirements Future Appointments Date Type Provider Dept  04/09/24 Appointment Jinna Weinman K, NP [facility]  Showing future appointments within next 90 days and meeting all other requirements  I discussed the assessment and treatment plan with the patient. The patient was provided an opportunity to ask questions and all were answered. The patient agreed with the plan and demonstrated an understanding of the instructions.  Patient advised to call back or seek an in-person evaluation if the symptoms or condition worsens.  I personally spent a total of 30 minutes in the care of the patient today including preparing to see the patient, getting/reviewing separately obtained history, performing a medically appropriate exam/evaluation, counseling and educating, placing orders, referring and communicating with other health care professionals, documenting clinical information in the EHR, independently interpreting results, communicating results, and coordinating care.   Note by: Emmy MARLA Blanch, NP  Date: 01/10/2024; Time: 9:26 AM

## 2024-01-10 NOTE — Progress Notes (Signed)
Nursing Pain Medication Assessment:  Safety precautions to be maintained throughout the outpatient stay will include: orient to surroundings, keep bed in low position, maintain call bell within reach at all times, provide assistance with transfer out of bed and ambulation.  Medication Inspection Compliance: Ms. Heeney did not comply with our request to bring her pills to be counted. She was reminded that bringing the medication bottles, even when empty, is a requirement.  Medication: None brought in. Pill/Patch Count: None available to be counted. Bottle Appearance: No container available. Did not bring bottle(s) to appointment. Filled Date: N/A Last Medication intake:  Yesterday

## 2024-01-11 ENCOUNTER — Other Ambulatory Visit: Payer: Self-pay | Admitting: Family

## 2024-01-11 DIAGNOSIS — K219 Gastro-esophageal reflux disease without esophagitis: Secondary | ICD-10-CM

## 2024-01-13 LAB — TOXASSURE SELECT 13 (MW), URINE

## 2024-02-20 ENCOUNTER — Ambulatory Visit: Admitting: Family

## 2024-02-29 ENCOUNTER — Ambulatory Visit: Admitting: Family

## 2024-02-29 ENCOUNTER — Encounter: Payer: Self-pay | Admitting: Family

## 2024-02-29 VITALS — BP 120/78 | HR 112 | Ht 61.5 in | Wt 148.6 lb

## 2024-02-29 DIAGNOSIS — M545 Low back pain, unspecified: Secondary | ICD-10-CM

## 2024-02-29 LAB — POCT URINALYSIS DIPSTICK
Bilirubin, UA: NEGATIVE
Blood, UA: NEGATIVE
Glucose, UA: NEGATIVE
Ketones, UA: NEGATIVE
Nitrite, UA: NEGATIVE
Protein, UA: NEGATIVE
Spec Grav, UA: 1.015 (ref 1.010–1.025)
Urobilinogen, UA: 0.2 U/dL
pH, UA: 7 (ref 5.0–8.0)

## 2024-02-29 MED ORDER — SULFAMETHOXAZOLE-TRIMETHOPRIM 800-160 MG PO TABS
1.0000 | ORAL_TABLET | Freq: Two times a day (BID) | ORAL | 0 refills | Status: AC
Start: 2024-02-29 — End: ?

## 2024-02-29 NOTE — Progress Notes (Unsigned)
 Ortho referral - right hip  UTI symptoms.

## 2024-03-12 ENCOUNTER — Other Ambulatory Visit: Payer: Self-pay | Admitting: Family

## 2024-03-12 DIAGNOSIS — Q8501 Neurofibromatosis, type 1: Secondary | ICD-10-CM

## 2024-03-12 DIAGNOSIS — G894 Chronic pain syndrome: Secondary | ICD-10-CM

## 2024-03-12 DIAGNOSIS — M546 Pain in thoracic spine: Secondary | ICD-10-CM

## 2024-03-15 ENCOUNTER — Ambulatory Visit: Payer: Self-pay

## 2024-03-25 ENCOUNTER — Ambulatory Visit: Admitting: Family

## 2024-04-07 NOTE — Progress Notes (Unsigned)
 PROVIDER NOTE: Interpretation of information contained herein should be left to medically-trained personnel. Specific patient instructions are provided elsewhere under Patient Instructions section of medical record. This document was created in part using AI and STT-dictation technology, any transcriptional errors that may result from this process are unintentional.  Patient: [name] [name]  Service: E/M   PCP: [name] Alan HERO, FNP  DOB: 01-10-1971  DOS: 04/09/2024  Provider: Emmy MARLA Blanch, NP  MRN: [id number]  Delivery: Face-to-face  Specialty: Interventional Pain Management  Type: Established Patient  Setting: Ambulatory outpatient facility  Specialty designation: 09  Referring Prov.: [name] Alan HERO, FNP  Location: Outpatient office facility       History of present illness (HPI) [name] [name], a 53 y.o. year old female, is here today because of her mid thoracic pain (R>L). [name] [name]'s primary complain today is Back Pain (Thoracic bilateral right is worse )  Pertinent problems: [name] has Back pain; Thoracic spine pain; Neurofibroma; Neuropathic pain; Neurofibromatosis, type 1 (HCC); Chronic pain syndrome; Chronic, continuous use of opioids; Neck pain; Chronic right hip pain; Chronic kidney disease, stage 3a (HCC); and Medication management on their pertinent problem list.  Pain Assessment: Severity of Chronic pain is reported as a 7 /10. Location: Back Left, Right, Mid/into the right arm. Onset: More than a month ago. Quality: Discomfort, Other (Comment) (electrical feeling). Timing: Constant. Modifying factor(s): rest, and medications. Vitals:  height is 5' 1.5 (1.562 m) and weight is 145 lb (65.8 kg). Her temporal temperature is 97.2 F (36.2 C) (abnormal). Her blood pressure is 129/95 (abnormal) and her pulse is 123 (abnormal). Her respiration is 16 and oxygen saturation is 100%.  BMI: Estimated body mass index is 26.95 kg/m as calculated from the following:   Height as of  this encounter: 5' 1.5 (1.562 m).   Weight as of this encounter: 145 lb (65.8 kg).  Last encounter: 01/10/2024. Last procedure: Visit date not found.  Reason for encounter: medication management. No change in medical history since last visit.  Patient's pain is at baseline.  Patient continues multimodal pain regimen as prescribed.  States that it provides pain relief and improvement in functional status.   Discussed the use of AI scribe software for clinical note transcription with the patient, who gave verbal consent to proceed.  History of Present Illness   [name] [name] is a 53 year old female with neurofibromatosis type 1 (NF1) who presents with mid thoracic and cervical pain.  She experiences mid thoracic pain, primarily on the right side, and cervical pain. 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 She has not received injections for pain management due to these tumors.  Her current pain management includes the use of Norco, which she takes without significant adverse reactions, except for occasional constipation. She manages this side effect with over-the-counter stool softeners.  She fills her prescriptions at [facility] in [place].     Pharmacotherapy Assessment   Hydrocodone -acetaminophen  (Norco) 10-325 mg tablet every 8 hours as needed for severe pain. MME=30 Monitoring: [place] PMP: PDMP reviewed during this encounter.       Pharmacotherapy: No side-effects or adverse reactions reported. Compliance: No problems identified. Effectiveness: Clinically acceptable.  [name] Chrissie MATSU, RN  04/09/2024  8:00 AM  Sign when Signing Visit Nursing Pain Medication Assessment:  Safety precautions to be maintained throughout the outpatient stay will include: orient to surroundings, keep bed in low position, maintain call bell within reach at all times,  provide assistance with transfer out of bed and ambulation.  Medication Inspection Compliance: Pill  count conducted under aseptic conditions, in front of the patient. Neither the pills nor the bottle was removed from the patient's sight at any time. Once count was completed pills were immediately returned to the patient in their original bottle.  Medication: Hydrocodone /APAP Pill/Patch Count: 6 of 90 pills/patches remain Pill/Patch Appearance: Markings consistent with prescribed medication Bottle Appearance: Standard pharmacy container. Clearly labeled. Filled Date: 62 / 2 / 2025 Last Medication intake:  Yesterday    UDS:  Summary  Date Value Ref Range Status  01/10/2024 FINAL  Final    Comment:    ==================================================================== [employer] 13 (MW) ==================================================================== Test                             Result       Flag       Units  Drug Present and Declared for Prescription Verification   Hydrocodone                     62           EXPECTED   ng/mg creat   Hydromorphone                  106          EXPECTED   ng/mg creat   Dihydrocodeine                 23           EXPECTED   ng/mg creat   Norhydrocodone                 413          EXPECTED   ng/mg creat    Sources of hydrocodone  include scheduled prescription medications.    Hydromorphone, dihydrocodeine and norhydrocodone are expected    metabolites of hydrocodone . Hydromorphone and dihydrocodeine are    also available as scheduled prescription medications.  ==================================================================== Test                      Result    Flag   Units      Ref Range   Creatinine              312              mg/dL      >=79 ==================================================================== Declared Medications:  The flagging and interpretation on this report are based on the  following declared medications.  Unexpected results may arise from  inaccuracies in the declared medications.   **Note: The testing  scope of this panel includes these medications:   Hydrocodone  (Norco)   **Note: The testing scope of this panel does not include the  following reported medications:   Acetaminophen  (Tylenol )  Acetaminophen  (Norco)  Albuterol  (Ventolin  HFA)  Amitriptyline  (Elavil )  Cyclobenzaprine  (Flexeril )  Desvenlafaxine  (Pristiq )  Famotidine (Pepcid)  Gabapentin  (Neurontin )  Hydroxyzine  (Vistaril )  Metoprolol  (Toprol )  Ondansetron  (Zofran )  Pantoprazole  (Protonix )  Sumatriptan  (Imitrex )  Vitamin D2 (Drisdol ) ==================================================================== For clinical consultation, please call [phone]. ====================================================================     No results found for: CBDTHCR No results found for: D8THCCBX No results found for: D9THCCBX  ROS  Constitutional: Denies any fever or chills Gastrointestinal: No reported hemesis, hematochezia, vomiting, or acute GI distress Musculoskeletal: Mid thoracic pain (R>L) Neurological: No reported episodes of acute onset apraxia, aphasia,  dysarthria, agnosia, amnesia, paralysis, loss of coordination, or loss of consciousness  Medication Review  HYDROcodone -acetaminophen , SUMAtriptan , Vitamin D  (Ergocalciferol ), acetaminophen , albuterol , amitriptyline , cyclobenzaprine , desvenlafaxine , famotidine, gabapentin , hydrOXYzine , metoprolol  succinate, naloxone , ondansetron , pantoprazole , and sulfamethoxazole -trimethoprim   History Review  Allergy: Ms. Torelli is allergic to peanuts [peanut oil] and penicillins. Drug: Ms. Mednick  reports no history of drug use. Alcohol:  reports no history of alcohol use. Tobacco:  reports that she has never smoked. She has never used smokeless tobacco. Social: Ms. Gulden  reports that she has never smoked. She has never used smokeless tobacco. She reports that she does not drink alcohol and does not use drugs. Medical:  has a past medical history of Asthma, H/O  sleep apnea (07/09/2014), Headache, Hypertension, Left arm numbness (02/12/2020), Neurofibromatosis (HCC), Renal disorder, Scoliosis of thoracic spine, and Thyroid  disease. Surgical: Ms. Broadfoot  has a past surgical history that includes Cesarean section; chamberlain procedure; Thyroidectomy; Colonoscopy with propofol  (N/A, 12/15/2022); Esophagogastroduodenoscopy (egd) with propofol  (N/A, 12/15/2022); biopsy (12/15/2022); polypectomy (12/15/2022); and maloney dilation (12/15/2022). Family: family history includes Cancer in her mother.  Laboratory Chemistry Profile   Renal Lab Results  Component Value Date   BUN 10 11/23/2023   CREATININE 0.70 11/23/2023   BCR 14 11/23/2023   GFRAA >60 05/24/2018   GFRNONAA >60 01/16/2022    Hepatic Lab Results  Component Value Date   AST 13 11/23/2023   ALT 12 11/23/2023   ALBUMIN 4.4 11/23/2023   ALKPHOS 145 (H) 11/23/2023   LIPASE 28 01/16/2022    Electrolytes Lab Results  Component Value Date   NA 138 11/23/2023   K 5.2 11/23/2023   CL 100 11/23/2023   CALCIUM 10.5 (H) 11/23/2023    Bone Lab Results  Component Value Date   VD25OH 73.5 11/23/2023    Inflammation (CRP: Acute Phase) (ESR: Chronic Phase) No results found for: CRP, ESRSEDRATE, LATICACIDVEN       Note: Above Lab results reviewed.  Recent Imaging Review  MM 3D SCREENING MAMMOGRAM BILATERAL BREAST CLINICAL DATA:  Screening.  EXAM: DIGITAL SCREENING BILATERAL MAMMOGRAM WITH TOMOSYNTHESIS AND CAD  TECHNIQUE: Bilateral screening digital craniocaudal and mediolateral oblique mammograms were obtained. Bilateral screening digital breast tomosynthesis was performed. The images were evaluated with computer-aided detection.  COMPARISON:  Previous exam(s).  ACR Breast Density Category c: The breasts are heterogeneously dense, which may obscure small masses.  FINDINGS: There are no findings suspicious for malignancy.  IMPRESSION: No mammographic evidence of malignancy. A  result letter of this screening mammogram will be mailed directly to the patient.  RECOMMENDATION: Screening mammogram in one year. (Code:SM-B-01Y)  BI-RADS CATEGORY  1: Negative.  Electronically Signed   By: Inocente Ast M.D.   On: 11/20/2023 08:56 Note: Reviewed        Physical Exam  Vitals: BP (!) 129/95 (BP Location: Left Arm, Patient Position: Sitting, Cuff Size: Normal)   Pulse (!) 123   Temp (!) 97.2 F (36.2 C) (Temporal)   Resp 16   Ht 5' 1.5 (1.562 m)   Wt 145 lb (65.8 kg)   SpO2 100%   BMI 26.95 kg/m  BMI: Estimated body mass index is 26.95 kg/m as calculated from the following:   Height as of this encounter: 5' 1.5 (1.562 m).   Weight as of this encounter: 145 lb (65.8 kg). Ideal: Ideal body weight: 48.9 kg (107 lb 14.6 oz) Adjusted ideal body weight: 55.7 kg (122 lb 12 oz) General appearance: Well nourished, well developed, and well hydrated. In no apparent  acute distress Mental status: Alert, oriented x 3 (person, place, & time)       Respiratory: No evidence of acute respiratory distress Eyes: PERLA  Musculoskeletal : + mid thoracic pain (R>L) Assessment   Diagnosis Status  1. Chronic midline thoracic back pain   2. Medication management   3. Chronic right hip pain   4. Chronic pain syndrome   5. Neck pain   6. Thoracic spine pain   7. Other idiopathic scoliosis, lumbosacral region   8. Chronic, continuous use of opioids   9. Neuropathic pain    Controlled Controlled Controlled   Updated Problems: No problems updated.  Plan of Care  Problem-specific:  Assessment and Plan    Chronic thoracic and cervical spine pain Chronic pain in mid thoracic and cervical spine, exacerbated by NF1-related tumors, limiting treatment options. - Continue Norco for pain management. - Use OTC stool softeners for constipation as needed.  Chronic opioid therapy Norco effective for pain with constipation as the only side effect, managed with stool  softeners. - Continue current opioid therapy with Norco. - Ensure prescription is filled at [facility] in [place].   Medication Management: Patient's pain is controlled with hydrocodone -acetaminophen  (Norco), will continue on current medication regimen.  Prescribing drug monitoring (PMP) reviewed; findings consistent with the use of prescribed medication and no evidence of narcotic misuse or abuse. Urine drug screening (UDS) up to date. The patient was advised to take OTC stool softener. Schedule follow-up in 90 days for medication management.       Ms. Aston L Dissinger has a current medication list which includes the following long-term medication(s): albuterol , amitriptyline , desvenlafaxine , famotidine, gabapentin , metoprolol  succinate, pantoprazole , sumatriptan , [START ON 04/11/2024] hydrocodone -acetaminophen , [START ON 05/11/2024] hydrocodone -acetaminophen , and [START ON 06/10/2024] hydrocodone -acetaminophen .  Pharmacotherapy (Medications Ordered): Meds ordered this encounter  Medications   HYDROcodone -acetaminophen  (NORCO) 10-325 MG tablet    Sig: Take 1 tablet by mouth every 8 (eight) hours as needed for severe pain (pain score 7-10). Must last 30 days.    Dispense:  90 tablet    Refill:  0    Chronic Pain: STOP Act (Not applicable) Fill 1 day early if closed on refill date. Avoid benzodiazepines within 8 hours of opioids   HYDROcodone -acetaminophen  (NORCO) 10-325 MG tablet    Sig: Take 1 tablet by mouth every 8 (eight) hours as needed for severe pain (pain score 7-10). Must last 30 days.    Dispense:  90 tablet    Refill:  0    Chronic Pain: STOP Act (Not applicable) Fill 1 day early if closed on refill date. Avoid benzodiazepines within 8 hours of opioids   HYDROcodone -acetaminophen  (NORCO) 10-325 MG tablet    Sig: Take 1 tablet by mouth every 8 (eight) hours as needed for severe pain (pain score 7-10). Must last 30 days.    Dispense:  90 tablet    Refill:  0    Chronic Pain: STOP Act (Not  applicable) Fill 1 day early if closed on refill date. Avoid benzodiazepines within 8 hours of opioids   Orders:  No orders of the defined types were placed in this encounter.     Return in about 3 months (around 07/08/2024) for (F2F), (MM), [name] Blanch NP.    Recent Visits Date Type Provider Dept  01/10/24 Office Visit Nylee Barbuto K, NP [facility]  Showing recent visits within past 90 days and meeting all other requirements Today's Visits Date Type Provider Dept  04/09/24 Office Visit Jillian Pianka K,  NP [facility]  Showing today's visits and meeting all other requirements Future Appointments No visits were found meeting these conditions. Showing future appointments within next 90 days and meeting all other requirements  I discussed the assessment and treatment plan with the patient. The patient was provided an opportunity to ask questions and all were answered. The patient agreed with the plan and demonstrated an understanding of the instructions.  Patient advised to call back or seek an in-person evaluation if the symptoms or condition worsens.  I personally spent a total of 30 minutes in the care of the patient today including preparing to see the patient, getting/reviewing separately obtained history, performing a medically appropriate exam/evaluation, counseling and educating, placing orders, referring and communicating with other health care professionals, documenting clinical information in the EHR, independently interpreting results, communicating results, and coordinating care.   Note by: Lorilei Horan K Kinzley Savell, NP (TTS and AI technology used. I apologize for any typographical errors that were not detected and corrected.) Date: 04/09/2024; Time: 8:22 AM

## 2024-04-08 ENCOUNTER — Other Ambulatory Visit: Payer: Self-pay | Admitting: Family

## 2024-04-08 ENCOUNTER — Ambulatory Visit
Admission: RE | Admit: 2024-04-08 | Discharge: 2024-04-08 | Disposition: A | Source: Ambulatory Visit | Attending: Nurse Practitioner | Admitting: Nurse Practitioner

## 2024-04-08 DIAGNOSIS — G8929 Other chronic pain: Secondary | ICD-10-CM | POA: Diagnosis present

## 2024-04-08 DIAGNOSIS — M25551 Pain in right hip: Secondary | ICD-10-CM | POA: Insufficient documentation

## 2024-04-09 ENCOUNTER — Ambulatory Visit: Admitting: Nurse Practitioner

## 2024-04-09 ENCOUNTER — Encounter: Payer: Self-pay | Admitting: Nurse Practitioner

## 2024-04-09 VITALS — BP 129/95 | HR 123 | Temp 97.2°F | Resp 16 | Ht 61.5 in | Wt 145.0 lb

## 2024-04-09 DIAGNOSIS — M25551 Pain in right hip: Secondary | ICD-10-CM | POA: Diagnosis not present

## 2024-04-09 DIAGNOSIS — F1190 Opioid use, unspecified, uncomplicated: Secondary | ICD-10-CM | POA: Diagnosis present

## 2024-04-09 DIAGNOSIS — M792 Neuralgia and neuritis, unspecified: Secondary | ICD-10-CM | POA: Diagnosis not present

## 2024-04-09 DIAGNOSIS — Z79891 Long term (current) use of opiate analgesic: Secondary | ICD-10-CM

## 2024-04-09 DIAGNOSIS — G894 Chronic pain syndrome: Secondary | ICD-10-CM | POA: Insufficient documentation

## 2024-04-09 DIAGNOSIS — M542 Cervicalgia: Secondary | ICD-10-CM | POA: Diagnosis not present

## 2024-04-09 DIAGNOSIS — M4127 Other idiopathic scoliosis, lumbosacral region: Secondary | ICD-10-CM | POA: Insufficient documentation

## 2024-04-09 DIAGNOSIS — G8929 Other chronic pain: Secondary | ICD-10-CM | POA: Diagnosis present

## 2024-04-09 DIAGNOSIS — Z79899 Other long term (current) drug therapy: Secondary | ICD-10-CM | POA: Insufficient documentation

## 2024-04-09 DIAGNOSIS — M546 Pain in thoracic spine: Secondary | ICD-10-CM | POA: Diagnosis not present

## 2024-04-09 MED ORDER — HYDROCODONE-ACETAMINOPHEN 10-325 MG PO TABS: 1.0000 | ORAL_TABLET | Freq: Three times a day (TID) | ORAL | 0 refills | Status: AC | PRN

## 2024-04-09 NOTE — Patient Instructions (Signed)

## 2024-04-09 NOTE — Progress Notes (Signed)
 Nursing Pain Medication Assessment:  Safety precautions to be maintained throughout the outpatient stay will include: orient to surroundings, keep bed in low position, maintain call bell within reach at all times, provide assistance with transfer out of bed and ambulation.  Medication Inspection Compliance: Pill count conducted under aseptic conditions, in front of the patient. Neither the pills nor the bottle was removed from the patient's sight at any time. Once count was completed pills were immediately returned to the patient in their original bottle.  Medication: Hydrocodone /APAP Pill/Patch Count: 6 of 90 pills/patches remain Pill/Patch Appearance: Markings consistent with prescribed medication Bottle Appearance: Standard pharmacy container. Clearly labeled. Filled Date: 26 / 2 / 2025 Last Medication intake:  Yesterday

## 2024-07-01 ENCOUNTER — Encounter: Admitting: Nurse Practitioner
